# Patient Record
Sex: Female | Born: 1950 | Race: White | Hispanic: No | Smoking: Former smoker
Health system: Southern US, Community
[De-identification: ages and names within clinical notes are randomized; demographics above are authoritative.]

## PROBLEM LIST (undated history)

## (undated) DIAGNOSIS — F418 Other specified anxiety disorders: Secondary | ICD-10-CM

## (undated) DIAGNOSIS — I10 Essential (primary) hypertension: Secondary | ICD-10-CM

## (undated) DIAGNOSIS — E7800 Pure hypercholesterolemia, unspecified: Secondary | ICD-10-CM

## (undated) DIAGNOSIS — F99 Mental disorder, not otherwise specified: Secondary | ICD-10-CM

## (undated) DIAGNOSIS — J449 Chronic obstructive pulmonary disease, unspecified: Secondary | ICD-10-CM

## (undated) DIAGNOSIS — M1990 Unspecified osteoarthritis, unspecified site: Secondary | ICD-10-CM

## (undated) HISTORY — DX: Mental disorder, not otherwise specified: F99

## (undated) HISTORY — PX: KNEE SURGERY: SHX244

## (undated) HISTORY — PX: WRIST FRACTURE SURGERY: SHX121

## (undated) HISTORY — PX: CHOLECYSTECTOMY: SHX55

---

## 2000-08-15 ENCOUNTER — Other Ambulatory Visit: Admission: RE | Admit: 2000-08-15 | Discharge: 2000-08-15 | Payer: Self-pay | Admitting: Family Medicine

## 2001-02-11 ENCOUNTER — Other Ambulatory Visit: Admission: RE | Admit: 2001-02-11 | Discharge: 2001-02-11 | Payer: Self-pay | Admitting: Family Medicine

## 2001-02-13 ENCOUNTER — Encounter: Payer: Self-pay | Admitting: Family Medicine

## 2001-02-13 ENCOUNTER — Ambulatory Visit (HOSPITAL_COMMUNITY): Admission: RE | Admit: 2001-02-13 | Discharge: 2001-02-13 | Payer: Self-pay | Admitting: Family Medicine

## 2001-06-02 ENCOUNTER — Ambulatory Visit (HOSPITAL_COMMUNITY): Admission: RE | Admit: 2001-06-02 | Discharge: 2001-06-02 | Payer: Self-pay | Admitting: Orthopaedic Surgery

## 2001-06-02 ENCOUNTER — Encounter: Payer: Self-pay | Admitting: Orthopaedic Surgery

## 2001-07-27 ENCOUNTER — Encounter: Payer: Self-pay | Admitting: Family Medicine

## 2001-07-27 ENCOUNTER — Ambulatory Visit (HOSPITAL_COMMUNITY): Admission: RE | Admit: 2001-07-27 | Discharge: 2001-07-27 | Payer: Self-pay | Admitting: Family Medicine

## 2001-09-29 ENCOUNTER — Emergency Department (HOSPITAL_COMMUNITY): Admission: EM | Admit: 2001-09-29 | Discharge: 2001-09-29 | Payer: Self-pay | Admitting: *Deleted

## 2001-11-10 ENCOUNTER — Encounter: Payer: Self-pay | Admitting: Family Medicine

## 2001-11-10 ENCOUNTER — Ambulatory Visit (HOSPITAL_COMMUNITY): Admission: RE | Admit: 2001-11-10 | Discharge: 2001-11-10 | Payer: Self-pay | Admitting: Family Medicine

## 2002-02-05 ENCOUNTER — Encounter: Payer: Self-pay | Admitting: Orthopaedic Surgery

## 2002-02-05 ENCOUNTER — Ambulatory Visit (HOSPITAL_COMMUNITY): Admission: RE | Admit: 2002-02-05 | Discharge: 2002-02-05 | Payer: Self-pay | Admitting: Orthopaedic Surgery

## 2002-02-15 ENCOUNTER — Encounter: Payer: Self-pay | Admitting: Family Medicine

## 2002-02-15 ENCOUNTER — Ambulatory Visit (HOSPITAL_COMMUNITY): Admission: RE | Admit: 2002-02-15 | Discharge: 2002-02-15 | Payer: Self-pay | Admitting: Family Medicine

## 2002-04-22 ENCOUNTER — Encounter (HOSPITAL_COMMUNITY): Admission: RE | Admit: 2002-04-22 | Discharge: 2002-05-22 | Payer: Self-pay | Admitting: Orthopaedic Surgery

## 2002-05-31 ENCOUNTER — Ambulatory Visit (HOSPITAL_COMMUNITY): Admission: RE | Admit: 2002-05-31 | Discharge: 2002-05-31 | Payer: Self-pay | Admitting: Obstetrics & Gynecology

## 2002-05-31 ENCOUNTER — Encounter: Payer: Self-pay | Admitting: Obstetrics & Gynecology

## 2002-06-14 ENCOUNTER — Observation Stay (HOSPITAL_COMMUNITY): Admission: RE | Admit: 2002-06-14 | Discharge: 2002-06-15 | Payer: Self-pay | Admitting: General Surgery

## 2002-07-31 ENCOUNTER — Emergency Department (HOSPITAL_COMMUNITY): Admission: EM | Admit: 2002-07-31 | Discharge: 2002-07-31 | Payer: Self-pay | Admitting: *Deleted

## 2002-08-05 ENCOUNTER — Emergency Department (HOSPITAL_COMMUNITY): Admission: EM | Admit: 2002-08-05 | Discharge: 2002-08-05 | Payer: Self-pay | Admitting: Emergency Medicine

## 2002-11-05 ENCOUNTER — Encounter: Payer: Self-pay | Admitting: Orthopedic Surgery

## 2002-11-10 ENCOUNTER — Ambulatory Visit (HOSPITAL_COMMUNITY): Admission: RE | Admit: 2002-11-10 | Discharge: 2002-11-10 | Payer: Self-pay | Admitting: Orthopedic Surgery

## 2002-11-10 ENCOUNTER — Encounter: Payer: Self-pay | Admitting: Orthopedic Surgery

## 2003-02-18 ENCOUNTER — Ambulatory Visit (HOSPITAL_COMMUNITY): Admission: RE | Admit: 2003-02-18 | Discharge: 2003-02-18 | Payer: Self-pay | Admitting: Family Medicine

## 2003-11-08 ENCOUNTER — Ambulatory Visit (HOSPITAL_COMMUNITY): Admission: RE | Admit: 2003-11-08 | Discharge: 2003-11-08 | Payer: Self-pay | Admitting: General Surgery

## 2004-02-03 ENCOUNTER — Ambulatory Visit: Payer: Self-pay | Admitting: Family Medicine

## 2004-02-22 ENCOUNTER — Ambulatory Visit (HOSPITAL_COMMUNITY): Admission: RE | Admit: 2004-02-22 | Discharge: 2004-02-22 | Payer: Self-pay | Admitting: Family Medicine

## 2004-03-31 ENCOUNTER — Emergency Department (HOSPITAL_COMMUNITY): Admission: EM | Admit: 2004-03-31 | Discharge: 2004-03-31 | Payer: Self-pay | Admitting: Emergency Medicine

## 2004-04-05 ENCOUNTER — Ambulatory Visit: Payer: Self-pay | Admitting: Orthopedic Surgery

## 2004-04-06 ENCOUNTER — Ambulatory Visit: Payer: Self-pay | Admitting: Family Medicine

## 2004-05-03 ENCOUNTER — Ambulatory Visit: Payer: Self-pay | Admitting: Orthopedic Surgery

## 2004-07-12 ENCOUNTER — Ambulatory Visit: Payer: Self-pay | Admitting: Family Medicine

## 2004-10-15 ENCOUNTER — Ambulatory Visit: Payer: Self-pay | Admitting: Family Medicine

## 2004-12-03 ENCOUNTER — Ambulatory Visit: Payer: Self-pay | Admitting: Family Medicine

## 2005-01-03 ENCOUNTER — Ambulatory Visit: Payer: Self-pay | Admitting: Family Medicine

## 2005-02-12 ENCOUNTER — Ambulatory Visit: Payer: Self-pay | Admitting: Family Medicine

## 2005-02-25 ENCOUNTER — Ambulatory Visit (HOSPITAL_COMMUNITY): Admission: RE | Admit: 2005-02-25 | Discharge: 2005-02-25 | Payer: Self-pay | Admitting: Family Medicine

## 2005-05-29 ENCOUNTER — Ambulatory Visit: Payer: Self-pay | Admitting: Family Medicine

## 2005-10-17 ENCOUNTER — Ambulatory Visit: Payer: Self-pay | Admitting: Gastroenterology

## 2005-10-24 ENCOUNTER — Encounter (INDEPENDENT_AMBULATORY_CARE_PROVIDER_SITE_OTHER): Payer: Self-pay | Admitting: Specialist

## 2005-10-24 ENCOUNTER — Ambulatory Visit (HOSPITAL_COMMUNITY): Admission: RE | Admit: 2005-10-24 | Discharge: 2005-10-24 | Payer: Self-pay | Admitting: Gastroenterology

## 2005-10-24 ENCOUNTER — Ambulatory Visit: Payer: Self-pay | Admitting: Gastroenterology

## 2005-12-26 ENCOUNTER — Ambulatory Visit: Payer: Self-pay | Admitting: Gastroenterology

## 2006-02-05 ENCOUNTER — Ambulatory Visit (HOSPITAL_COMMUNITY): Admission: RE | Admit: 2006-02-05 | Discharge: 2006-02-05 | Payer: Self-pay | Admitting: Family Medicine

## 2006-02-15 ENCOUNTER — Emergency Department (HOSPITAL_COMMUNITY): Admission: EM | Admit: 2006-02-15 | Discharge: 2006-02-15 | Payer: Self-pay | Admitting: Emergency Medicine

## 2006-02-27 ENCOUNTER — Ambulatory Visit (HOSPITAL_COMMUNITY): Admission: RE | Admit: 2006-02-27 | Discharge: 2006-02-27 | Payer: Self-pay | Admitting: Family Medicine

## 2006-03-26 ENCOUNTER — Emergency Department (HOSPITAL_COMMUNITY): Admission: EM | Admit: 2006-03-26 | Discharge: 2006-03-26 | Payer: Self-pay | Admitting: Emergency Medicine

## 2006-04-01 ENCOUNTER — Ambulatory Visit: Payer: Self-pay | Admitting: Orthopedic Surgery

## 2006-04-02 ENCOUNTER — Ambulatory Visit (HOSPITAL_COMMUNITY): Admission: RE | Admit: 2006-04-02 | Discharge: 2006-04-02 | Payer: Self-pay | Admitting: Family Medicine

## 2006-05-29 ENCOUNTER — Ambulatory Visit: Payer: Self-pay | Admitting: Orthopedic Surgery

## 2006-07-01 ENCOUNTER — Ambulatory Visit: Payer: Self-pay | Admitting: Gastroenterology

## 2006-08-12 ENCOUNTER — Ambulatory Visit: Payer: Self-pay | Admitting: Gastroenterology

## 2006-09-12 ENCOUNTER — Emergency Department (HOSPITAL_COMMUNITY): Admission: EM | Admit: 2006-09-12 | Discharge: 2006-09-12 | Payer: Self-pay | Admitting: Emergency Medicine

## 2006-09-22 ENCOUNTER — Emergency Department (HOSPITAL_COMMUNITY): Admission: EM | Admit: 2006-09-22 | Discharge: 2006-09-22 | Payer: Self-pay | Admitting: Emergency Medicine

## 2006-09-29 ENCOUNTER — Ambulatory Visit: Payer: Self-pay | Admitting: Orthopedic Surgery

## 2006-10-28 ENCOUNTER — Emergency Department (HOSPITAL_COMMUNITY): Admission: EM | Admit: 2006-10-28 | Discharge: 2006-10-28 | Payer: Self-pay | Admitting: *Deleted

## 2006-11-10 ENCOUNTER — Ambulatory Visit: Payer: Self-pay | Admitting: Orthopedic Surgery

## 2007-02-27 ENCOUNTER — Ambulatory Visit: Payer: Self-pay | Admitting: Gastroenterology

## 2007-03-26 ENCOUNTER — Encounter: Payer: Self-pay | Admitting: Family Medicine

## 2007-05-03 ENCOUNTER — Emergency Department (HOSPITAL_COMMUNITY): Admission: EM | Admit: 2007-05-03 | Discharge: 2007-05-03 | Payer: Self-pay | Admitting: Emergency Medicine

## 2007-09-08 ENCOUNTER — Inpatient Hospital Stay (HOSPITAL_COMMUNITY): Admission: EM | Admit: 2007-09-08 | Discharge: 2007-09-10 | Payer: Self-pay | Admitting: Emergency Medicine

## 2007-09-09 ENCOUNTER — Ambulatory Visit: Payer: Self-pay | Admitting: Gastroenterology

## 2007-09-10 ENCOUNTER — Ambulatory Visit: Payer: Self-pay | Admitting: Internal Medicine

## 2007-09-23 ENCOUNTER — Ambulatory Visit (HOSPITAL_COMMUNITY): Admission: RE | Admit: 2007-09-23 | Discharge: 2007-09-23 | Payer: Self-pay | Admitting: Internal Medicine

## 2007-10-14 ENCOUNTER — Emergency Department (HOSPITAL_COMMUNITY): Admission: EM | Admit: 2007-10-14 | Discharge: 2007-10-14 | Payer: Self-pay | Admitting: Emergency Medicine

## 2007-10-15 ENCOUNTER — Inpatient Hospital Stay (HOSPITAL_COMMUNITY): Admission: EM | Admit: 2007-10-15 | Discharge: 2007-10-16 | Payer: Self-pay | Admitting: Emergency Medicine

## 2007-11-20 ENCOUNTER — Encounter
Admission: RE | Admit: 2007-11-20 | Discharge: 2007-11-23 | Payer: Self-pay | Admitting: Physical Medicine & Rehabilitation

## 2007-11-23 ENCOUNTER — Ambulatory Visit: Payer: Self-pay | Admitting: Physical Medicine & Rehabilitation

## 2007-12-09 DIAGNOSIS — M4120 Other idiopathic scoliosis, site unspecified: Secondary | ICD-10-CM | POA: Insufficient documentation

## 2007-12-09 DIAGNOSIS — R141 Gas pain: Secondary | ICD-10-CM

## 2007-12-09 DIAGNOSIS — K589 Irritable bowel syndrome without diarrhea: Secondary | ICD-10-CM

## 2007-12-09 DIAGNOSIS — J45909 Unspecified asthma, uncomplicated: Secondary | ICD-10-CM

## 2007-12-09 DIAGNOSIS — I10 Essential (primary) hypertension: Secondary | ICD-10-CM | POA: Insufficient documentation

## 2007-12-09 DIAGNOSIS — E785 Hyperlipidemia, unspecified: Secondary | ICD-10-CM

## 2007-12-09 DIAGNOSIS — R109 Unspecified abdominal pain: Secondary | ICD-10-CM | POA: Insufficient documentation

## 2007-12-09 DIAGNOSIS — R143 Flatulence: Secondary | ICD-10-CM

## 2007-12-09 DIAGNOSIS — E119 Type 2 diabetes mellitus without complications: Secondary | ICD-10-CM

## 2007-12-09 DIAGNOSIS — Z9889 Other specified postprocedural states: Secondary | ICD-10-CM | POA: Insufficient documentation

## 2007-12-09 DIAGNOSIS — K219 Gastro-esophageal reflux disease without esophagitis: Secondary | ICD-10-CM

## 2007-12-09 DIAGNOSIS — R142 Eructation: Secondary | ICD-10-CM

## 2008-06-14 ENCOUNTER — Emergency Department (HOSPITAL_COMMUNITY): Admission: EM | Admit: 2008-06-14 | Discharge: 2008-06-14 | Payer: Self-pay | Admitting: Emergency Medicine

## 2008-09-27 ENCOUNTER — Ambulatory Visit (HOSPITAL_COMMUNITY): Admission: RE | Admit: 2008-09-27 | Discharge: 2008-09-27 | Payer: Self-pay | Admitting: Internal Medicine

## 2008-10-09 ENCOUNTER — Encounter: Payer: Self-pay | Admitting: Orthopedic Surgery

## 2008-10-09 ENCOUNTER — Emergency Department (HOSPITAL_COMMUNITY): Admission: EM | Admit: 2008-10-09 | Discharge: 2008-10-09 | Payer: Self-pay | Admitting: Emergency Medicine

## 2008-11-01 ENCOUNTER — Ambulatory Visit: Payer: Self-pay | Admitting: Orthopedic Surgery

## 2008-11-01 DIAGNOSIS — Z8679 Personal history of other diseases of the circulatory system: Secondary | ICD-10-CM | POA: Insufficient documentation

## 2008-11-01 DIAGNOSIS — S93609A Unspecified sprain of unspecified foot, initial encounter: Secondary | ICD-10-CM | POA: Insufficient documentation

## 2008-11-01 DIAGNOSIS — S93409A Sprain of unspecified ligament of unspecified ankle, initial encounter: Secondary | ICD-10-CM | POA: Insufficient documentation

## 2008-12-01 ENCOUNTER — Emergency Department (HOSPITAL_COMMUNITY): Admission: EM | Admit: 2008-12-01 | Discharge: 2008-12-01 | Payer: Self-pay | Admitting: Emergency Medicine

## 2009-11-22 ENCOUNTER — Emergency Department (HOSPITAL_COMMUNITY): Admission: EM | Admit: 2009-11-22 | Discharge: 2009-11-22 | Payer: Self-pay | Admitting: Emergency Medicine

## 2009-12-09 ENCOUNTER — Emergency Department (HOSPITAL_COMMUNITY): Admission: EM | Admit: 2009-12-09 | Discharge: 2009-12-09 | Payer: Self-pay | Admitting: Emergency Medicine

## 2010-01-22 ENCOUNTER — Emergency Department (HOSPITAL_COMMUNITY): Admission: EM | Admit: 2010-01-22 | Discharge: 2010-01-22 | Payer: Self-pay | Admitting: Emergency Medicine

## 2010-04-15 ENCOUNTER — Encounter: Payer: Self-pay | Admitting: Internal Medicine

## 2010-04-24 NOTE — Letter (Signed)
Summary: office notes  office notes   Imported By: Dierdre Harness 11/10/2009 08:26:15  _____________________________________________________________________  External Attachment:    Type:   Image     Comment:   External Document

## 2010-04-24 NOTE — Letter (Signed)
Summary: misc  misc   Imported By: Dierdre Harness 11/10/2009 08:25:38  _____________________________________________________________________  External Attachment:    Type:   Image     Comment:   External Document

## 2010-04-24 NOTE — Letter (Signed)
Summary: demo  demo   Imported By: Dierdre Harness 11/10/2009 08:24:03  _____________________________________________________________________  External Attachment:    Type:   Image     Comment:   External Document

## 2010-04-24 NOTE — Letter (Signed)
Summary: CONSULTS  CONSULTS   Imported By: Dierdre Harness 11/09/2009 15:29:28  _____________________________________________________________________  External Attachment:    Type:   Image     Comment:   External Document

## 2010-04-24 NOTE — Letter (Signed)
Summary: labs  labs   Imported By: Dierdre Harness 11/10/2009 08:25:03  _____________________________________________________________________  External Attachment:    Type:   Image     Comment:   External Document

## 2010-04-24 NOTE — Letter (Signed)
Summary: history and physical  history and physical   Imported By: Dierdre Harness 11/10/2009 08:24:36  _____________________________________________________________________  External Attachment:    Type:   Image     Comment:   External Document

## 2010-05-27 ENCOUNTER — Emergency Department (HOSPITAL_COMMUNITY): Payer: Medicare Other

## 2010-05-27 ENCOUNTER — Emergency Department (HOSPITAL_COMMUNITY)
Admission: EM | Admit: 2010-05-27 | Discharge: 2010-05-27 | Disposition: A | Discharge status: Home / Self Care | Payer: Medicare Other | Attending: Emergency Medicine | Admitting: Emergency Medicine

## 2010-05-27 DIAGNOSIS — S0120XA Unspecified open wound of nose, initial encounter: Secondary | ICD-10-CM | POA: Insufficient documentation

## 2010-05-27 DIAGNOSIS — S01409A Unspecified open wound of unspecified cheek and temporomandibular area, initial encounter: Secondary | ICD-10-CM | POA: Insufficient documentation

## 2010-05-27 DIAGNOSIS — Y93E5 Activity, floor mopping and cleaning: Secondary | ICD-10-CM | POA: Insufficient documentation

## 2010-05-27 DIAGNOSIS — Y92009 Unspecified place in unspecified non-institutional (private) residence as the place of occurrence of the external cause: Secondary | ICD-10-CM | POA: Insufficient documentation

## 2010-05-27 DIAGNOSIS — I10 Essential (primary) hypertension: Secondary | ICD-10-CM | POA: Insufficient documentation

## 2010-05-27 DIAGNOSIS — M549 Dorsalgia, unspecified: Secondary | ICD-10-CM | POA: Insufficient documentation

## 2010-05-27 DIAGNOSIS — W010XXA Fall on same level from slipping, tripping and stumbling without subsequent striking against object, initial encounter: Secondary | ICD-10-CM | POA: Insufficient documentation

## 2010-05-27 DIAGNOSIS — E119 Type 2 diabetes mellitus without complications: Secondary | ICD-10-CM | POA: Insufficient documentation

## 2010-05-27 DIAGNOSIS — G8929 Other chronic pain: Secondary | ICD-10-CM | POA: Insufficient documentation

## 2010-06-07 LAB — URINALYSIS, ROUTINE W REFLEX MICROSCOPIC
Bilirubin Urine: NEGATIVE
Glucose, UA: 500 mg/dL — AB
Ketones, ur: NEGATIVE mg/dL
Specific Gravity, Urine: <1.005 — ABNORMAL LOW (ref 1.005–1.030)
pH: 6 (ref 5.0–8.0)

## 2010-06-07 LAB — DIFFERENTIAL
Eosinophils Relative: 1 % (ref 0–5)
Lymphocytes Relative: 11 % — ABNORMAL LOW (ref 12–46)
Lymphs Abs: 0.8 10*3/uL (ref 0.7–4.0)
Monocytes Absolute: 0.4 10*3/uL (ref 0.1–1.0)
Neutro Abs: 6.6 10*3/uL (ref 1.7–7.7)

## 2010-06-07 LAB — CBC
HCT: 29 % — ABNORMAL LOW (ref 36.0–46.0)
MCV: 90.9 fL (ref 78.0–100.0)
RBC: 3.19 MIL/uL — ABNORMAL LOW (ref 3.87–5.11)
WBC: 7.9 10*3/uL (ref 4.0–10.5)

## 2010-06-07 LAB — BASIC METABOLIC PANEL
Chloride: 109 mEq/L (ref 96–112)
GFR calc Af Amer: >60 mL/min (ref >60)
Potassium: 4.3 mEq/L (ref 3.5–5.1)
Sodium: 137 mEq/L (ref 135–145)

## 2010-06-07 LAB — GLUCOSE, CAPILLARY
Glucose-Capillary: 166 mg/dL — ABNORMAL HIGH (ref 70–99)
Glucose-Capillary: 199 mg/dL — ABNORMAL HIGH (ref 70–99)
Glucose-Capillary: 61 mg/dL — ABNORMAL LOW (ref 70–99)

## 2010-08-07 NOTE — Discharge Summary (Signed)
Kristen Jones, DAKER              ACCOUNT NO.:  [id number]   MEDICAL RECORD NO.:  [id number]          PATIENT TYPE:  INP   LOCATION:  A330                          FACILITY:  [facility]   PHYSICIAN:  Thayer Headings, MD    DATE OF BIRTH:  1950-10-09   DATE OF ADMISSION:  09/08/2007  DATE OF DISCHARGE:  LH                               DISCHARGE SUMMARY   PRIMARY CARE PHYSICIAN:  Nurse, mental health in [place].   GASTROENTEROLOGIST:  Caro Hight, MD   HISTORY OF PRESENT ILLNESS:  Please see previously dictated history and  physical.  Briefly, this is a 60 year old female with a history of  chronic abdominal pain, bloating, diarrhea, and GERD, diagnosis of  functional abdominal disorder who presented with a complaint of  continued nausea, vomiting, and diarrhea, which she reports was her  baseline and also a worsening mid epigastric pain.  The patient reports  that she had had no blood in her emesis or stool and also to the  gastroenterologist reported an ear ache and some URI symptoms lately.  Upon presentation, it was noted that her AST and ALT were elevated, her  WBC was low, and her platelets were low.   PROBLEMS AT DISCHARGE:  1. Transaminitis.  2. Leukopenia.  3. Thrombocytopenia.  4. Diabetes mellitus.  5. Hypertension.  6. Gastroesophageal reflux disease.  7. Functional gut disorder.  8. Hyperlipidemia.  9. Asthma.  10.History of carpal tunnel release.  11.History of cholecystectomy.  12.History of knee replacement.   Medications at discharge: The patient is unaware of her medications and  did not bring her medications with her and therefore have not been  verified.   HOSPITAL COURSE BY PROBLEMS:  1. Transaminitis.  The patient's initial AST was 141 with an ALT of      80, which was subsequently decreased to 112 and 70 respectively      with the followup labs at discharge, pending.  The patient reports      that her symptoms, which included some midepigastric pain or  resolved rather quickly by the a.m. of after her day of admission.      At discharge, the patient was feeling well and was eager to go home      and had no and her LFTs were trending down.  Etiology of this is      unknown at this time though this certainly a viral syndrome,such as      EBV or CMV or possibilities, particularly with leukopenia as well.      She will need further follow up with her primary care physician to      assure that this has resolved.  Also, a hepatitis panel was pending      at discharge.  2. Acute renal failure.  The patient's creatinine was noted to be      elevated at 1.8 on admission, which is up from a baseline of 1.3.      At discharge, responded to fluids and was down to 1.44, which is      near her baseline.  3. Leukopenia with thrombocytopenia.  Her initial WBC was 2.2, and      noted to have mildly elevated eosinophils at 7% and also her      platelet count was 63.  Etiology of this is unknown as well      certainly differential includes medications, also viral syndrome.      It is a bit difficult to know what medications may be the culprit.      She is unaware of what medications she takes and has not brought      them into to be evaluated.  Her absolute neutrophil count at      discharge was 900, and the patient did remain afebrile, so no      concern for febrile neutropenia at this time.  However, she will      need a close followup with her primary care physician as well to      assure that it is resolving or she will need a referral to      Hematology.  4. Hypotension.  The patient responded to fluids and her blood      pressure prior to discharge was in the normal range of 110/62 and      again the patient has had no significant complaints.  5. Smoking.  The patient continues to smoke.  She was counseled on      tobacco cessation.  She did also require inhalers due to her likely      underlying COPD.  6. Abdominal pain.  The patient did have an  ultrasound done during the      hospitalization, which did show probable fatty liver.  The patient      was counseled on diet modifications and will need to continue with      low-fat diet.      Thayer Headings, MD  Electronically Signed     RWC/MEDQ  D:  09/10/2007  T:  09/10/2007  Job:  [id number]

## 2010-08-07 NOTE — Group Therapy Note (Signed)
Kristen Jones, Kristen Jones              ACCOUNT NO.:  [id number]   MEDICAL RECORD NO.:  [id number]          PATIENT TYPE:  INP   LOCATION:  A330                          FACILITY:  [facility]   PHYSICIAN:  Thayer Headings, MD    DATE OF BIRTH:  April 06, 1950   DATE OF PROCEDURE:  09/09/2007  DATE OF DISCHARGE:                                 PROGRESS NOTE   SUBJECTIVE:  Overnight, no acute events, and in fact this a.m. the  patient reports she is having no more diarrhea, abdominal pain, and has  not had any emesis.   PHYSICAL EXAMINATION:  VITAL SIGNS:  Temperature 98.8, pulse is 68,  respirations 20, blood pressure 78/47, O2 saturations 95% on room air.  GENERAL:  The patient is awake, alert, and oriented x3, and appears in  no acute distress.  CARDIOVASCULAR:  Regular rate and rhythm, with no murmurs, rubs, or  gallops.  LUNGS:  Clear to auscultation bilaterally.  ABDOMEN:  Soft, nontender, nondistended.  Positive bowel sounds, and no  hepatosplenomegaly.  EXTREMITIES:  With no edema.   LABORATORY DATA:  This a.m., WBC is 2.5, hemoglobin 10.5, platelets 56,  and other labs pending.   ASSESSMENT AND PLAN:  1. Abdominal pain.  This appears to be resolved for the patient, and      the patient symptomatically is feeling much improved.  Will await      input from Dr. Stann Mainland regarding her chronic abdominal pain and      diarrhea.  Will also ensure that she is taking p.o. today, and      continue with her medications.  2. Leukopenia with thrombocytopenia.  Will have hematology evaluate      today.  3. Transaminitis.  As #1, we will have gastroenterology input, and      awaiting hepatitis panel.      Thayer Headings, MD  Electronically Signed     RWC/MEDQ  D:  09/09/2007  T:  09/09/2007  Job:  [id number]

## 2010-08-07 NOTE — Assessment & Plan Note (Signed)
NAMEMarland Kitchen  Kristen Jones, Kristen Jones               CHART#:  [id number]   DATE:  02/27/2007                       DOB:  [birth year]   REFERRING PHYSICIAN:  Dr. Lorelee Market.   PROBLEM LIST:  1. Abdominal pain and bloating secondary to functional gut disorder.  2. Hypertension.  3. Gastroesophageal reflux disease.  4. Hyperlipidemia.   SUBJECTIVE:  The patient is a 60 year old female who presents for return  patient visit.  She states her abdominal pain is better.  She does not  need any refills.  She is not having any nausea or vomiting.  Her last  colonoscopy was in 2006.   OBJECTIVE:  VITAL SIGNS:  Weight 144.5 pounds (down 2 and 1/2 pounds  since July 2007), height 4 feet 10 inches, temperature 98, blood  pressure 120/32, pulse 72.  GENERAL:  She is in no apparent distress, alert and oriented x4.  LUNGS:  Clear to auscultation bilaterally.  CARDIOVASCULAR:  Regular rhythm.  No murmur.  Normal S1 and S2.  ABDOMEN:  Bowel sounds are present.  Soft, nontender, nondistended.  No  rebound or guarding.   ASSESSMENT:  The patient is a 60 year old female with functional gut  disorder whose symptoms seem to be fairly well controlled on  hyoscyamine.   Thank you for allowing me to see the patient in consultation.  My  recommendations follow.   RECOMMENDATIONS:  1. Continue hyoscyamine and omeprazole.  2. Return patient visit in 12 months.  3. I will be happy to refill her hyoscyamine and omeprazole in the      meantime.       Caro Hight, M.D.  Electronically Signed     SM/MEDQ  D:  02/27/2007  T:  02/27/2007  Job:  [id number]   cc:   Lorelee Market

## 2010-08-07 NOTE — Consult Note (Signed)
Kristen Jones, Kristen Jones              ACCOUNT NO.:  [id number]   MEDICAL RECORD NO.:  [id number]          PATIENT TYPE:  INP   LOCATION:  A330                          FACILITY:  [facility]   PHYSICIAN:  Caro Hight, M.D.      DATE OF BIRTH:  [birth year]   DATE OF CONSULTATION:  09/09/2007  DATE OF DISCHARGE:                                 CONSULTATION   GASTROENTEROLOGY CONSULTATION   REASON FOR CONSULTATION:  Nausea, vomiting, diarrhea, transaminitis.   HISTORY OF PRESENT ILLNESS:  The patient is a 60 year old Caucasian  female who is a very poor historian who presented to the emergency  department yesterday evening with complaints of several day history of  nausea, vomiting, diarrhea.  She states her symptoms began with an  earache several days ago.  She called her primary care physician, Dr.  Sherrie Sport.  She states she was given possibly an antibiotic.  She says she  was not feeling any better and she eventually developed more of a chest  congestion and cold.  Four days ago she started having vomiting as well  as diarrhea.  She has not been able to eat or keep anything down.  She  presented to the emergency department.  There, she was found to have a  creatinine of 1.8, her BUN was 31.  Her white count was 2.2.  Hemoglobin  was 12.3.  Platelet count was 63,000.  Her transaminases were elevated  with an AST of 141, ALT of 80, albumin was 3.  She was admitted for  further work up and management.  This morning she states she is feeling  better.  She has had no vomiting or diarrhea since admission.  She has  not had any supportive measures other than fluids since she has been  admitted to the floor.  This morning she was eating eggs, bacon and  biscuit without any difficulties.   She denies any hematemesis, melena, bright red blood per rectum.  She  denies any abdominal pain.  She complains of subjective chills. She has  a history of chronic GERD, well controlled on omeprazole.  She also  has  a history of functional gut disorder.  She was last seen in December of  2008 by Dr. Stann Mainland and was doing well at that time on Levbid.  Unfortunately, the patient does not know which medication she is on.  She did not bring any list of her pills.  She is not sure what medicines  she is allergic to.   MEDICATIONS:  Medications at home, suspect to be incomplete:  She states  she is on potassium chloride, metanx, omeprazole, glyburide, paroxetine,  Benicar, buspirone, Lasix, Asmanex, albuterol, and recent antibiotic.   ALLERGIES:  Listed in the medical records as [place], [place] and [place].   PAST MEDICAL HISTORY:  1. Diabetes mellitus.  2. Hypertension.  3. Chronic GERD.  4. Functional gut disorder.  5. Hyperlipidemia.  6. Asthma.  7. Carpal tunnel release.  8. Cholecystectomy.  9. She states she has had a knee replacement and two knee surgeries on      the  left.  Last surgery over 3 years ago.  10.EGD in our office in 2007.  She had a 3 mm duodenal polyp.      Pathology was benign. Duodenal biopsy showed chronic inflammation      but no villous atrophy.  She says she had a colonoscopy in 2006 and      denies having any polyps.   FAMILY HISTORY:  Negative for colorectal cancer or polyps.   SOCIAL HISTORY:  She is single. She has one child.  She is disabled.  She smokes way too much and no alcohol use or drug use.   REVIEW OF SYSTEMS:  See history of present illness for GI.  She HPI for  cardiopulmonary.  GENITOURINARY:  Denies any dysuria, hematuria.   PHYSICAL EXAMINATION:  VITAL SIGNS:  T current 98.8.  T max 99. Pulse  68.  Respirations 20.  Blood pressure 78/47.  Height 58 inches. Weight  63.8 kg.  GENERAL:  Pleasant, middle-aged, obese Caucasian female in no acute  distress. SKIN:  Warm and dry, no  jaundice.HEENT:  Sclerae nonicteric.  Oropharyngeal mucosa moist and pink.  No lymphadenopathy.CHEST:  Lungs  are clear to auscultation.CARDIAC:  Exam reveals regular  rate and  rhythm, normal S1, S2, no murmurs, rubs or gallops.  ABDOMEN:  Soft,  positive bowel sounds.  Abdomen nontender, nondistended, no organomegaly  or masses.  No rebound, no guarding, no abdominal bruits or  hernias.EXTREMITIES:  Lower extremities have no edema.   LABORATORY DATA:  White blood cell count 2500 with an ANC of 900, with  lymphocytes 51%, eosinophil count 8%, hemoglobin 10.5, hematocrit 29.3,  MCV 87.6, platelet count 56,000.  Sodium 132, potassium 4.2.  BUN 24,  creatinine 1.49, glucose 196, total bilirubin 0.5, alkaline phosphatase  32, AST 112, ALT 70, albumin 2.3.  Amylase and lipase normal.  INR 0.9.  Acute abdominal series revealed cardiomegaly with low lung volumes and  left base atelectasis or bronchiectasis, stable from previous exam.  Abdominal ultrasound pending.   IMPRESSION:  The patient is a 60 year old lady with history of chronic  functional gut disorder who was at baseline doing fairly well and  presented with a four day history of nausea, vomiting, diarrhea.  She is  found to have dehydration.  She also has upper respiratory symptoms.  She has significant leukopenia with neutropenia and overall really  pancytopenia.  Her transaminases are up, a year ago only minimally  elevated.  She did have fatty liver on a CT scan a year ago.  Her  eosinophil count is up as well.  Overall, at this point, would favor  acute viral illness.  The patient clinically is feeling better.  Hopefully, will be able to go home in the next 24 hours or so, however,  would like to see trend of her liver function tests as well as her  platelet count and white blood cell count, etc.  She will need to have  close gastrointestinal followup to make sure there is no evidence of  chronic liver disease.   PLAN:  1. Supportive measures.  2. Continue proton pump inhibitor.  3. Liver function tests and CBC in the morning.  4. Will follow her abdominal ultrasound results when  available.   We would like to thank Dr. Linus Salmons with [facility] for allowing [place]  to take part in the care of this patient.      Neil Crouch, P.A.      Caro Hight, M.D.  Electronically Signed    LL/MEDQ  D:  09/09/2007  T:  09/09/2007  Job:  [id number]   cc:   Thayer Headings, MD

## 2010-08-07 NOTE — H&P (Signed)
Kristen Jones, Kristen Jones              ACCOUNT NO.:  [id number]   MEDICAL RECORD NO.:  [id number]          PATIENT TYPE:  INP   LOCATION:  A330                          FACILITY:  [facility]   PHYSICIAN:  Thayer Headings, MD    DATE OF BIRTH:  [birth year]   DATE OF ADMISSION:  09/08/2007  DATE OF DISCHARGE:  LH                              HISTORY & PHYSICAL   PRIMARY CARE PHYSICIAN:  Lorelee Market, MD, in [place].   CHIEF COMPLAINT:  Nausea, vomiting and diarrhea.   HISTORY OF PRESENT ILLNESS:  This is a 60 year old female with a history  of chronic abdominal bloating, abdominal pain, diarrhea and GERD, status  post extensive GI workup in the past, who presents here with a complaint  of vomiting and diarrhea.  The patient does report that her diarrhea and  abdominal bloating have been the same as her chronic issues.  She does  report a new worsening midepigastric pain associated also with bloating  this a.m. and emesis x1 this a.m.  The patient denies any blood in her  emesis or stool and she does report she has been taking her usual  medications as prescribed; however, the patient is unaware of what her  medications are and does not have a list with her at this time.  The  patient does deny fever to me; however, she did report a fever to 102 to  the emergency room physician.  The patient also reports no sick contacts  and no travel.  Of note, the patient at this is a poor historian and  does not directly answer questions.  The patient does deny headache,  chest pain, neck pain, dysuria, hematuria or any other complaints.   PAST MEDICAL HISTORY:  1. Chronic abdominal pain with bloating secondary to functional gut      disorder, followed by Dr. Stann Mainland.  2. Hypertension.  3. GERD.  4. Hyperlipidemia.  5. Diabetes.  6. COPD.  7. Hyperlipidemia.  8. History of cholecystectomy.  9. Right carpal tunnel surgery.   Further medical history is unobtainable from the patient, though she is  on  medication for depression as well as a diuretic, which potentially is  for heart failure.   MEDICATIONS:  The patient is unsure what medications she is taking.  She  does get her medications from [place], phone number is [phone].  A list does include potassium chloride at 10 mEq, omeprazole,  glyburide, paroxetine, Benicar, buspirone, Asmanex [place],  albuterol, Lasix.  However, again, the patient does not know if these  are all active medications and what other medications she may be  missing.   ALLERGIES:  METOCLOPRAMIDE, ACIPHEX and FLUOTHANE, which all cause  stuttering speech.   SOCIAL HISTORY:  A long history of tobacco abuse but denies any alcohol  or drugs.   FAMILY HISTORY:  Noncontributory.   REVIEW OF SYSTEMS:  Negative except as per the history of present  illness.   PHYSICAL EXAM:  VITALS:  Temperature is 99.0, pulse is 77, respirations  20, blood pressure is 87/43, O2 saturation is 96%.  GENERAL:  The patient is awake and alert and oriented x3 and appears in  no acute distress.  She does not appear in any way ill-appearing.  HEENT: Anicteric with no thrush.  CARDIOVASCULAR:  Regular rate and rhythm with no murmurs, rubs or  gallops.  LUNGS:  Clear to auscultation bilaterally.  ABDOMEN:  Soft, nontender, nondistended, with positive bowel sounds and  no hepatosplenomegaly.  EXTREMITIES:  Without edema.   LABORATORY DATA:  A WBC of 2.2 with 56% neutrophils, hemoglobin of 12,  platelets 63.  Sodium 131, potassium 4.2, chloride 103, bicarb 23, BUN  31, creatinine is 1.80, which is up from her baseline, glucose is 113,  AST is 141, ALT is 80.  INR is 0.9.  Lipase is 28, amylase 57.  UA with  a few rbc's, negative nitrite and leukocyte.  T-bili 0.7, alkaline  phosphatase is 37, albumin is 3.0, total protein is 5.6.  Abdominal x-  ray shows no bowel obstruction or free air, with cardiomegaly and  possible bronchiectasis in the left base of the  lungs.   ASSESSMENT/PLAN:  This is a 60 year old female with a long history of  chronic diarrhea and abdominal pain with bloating, who presents here  with the same symptoms plus an episode of emesis and found to have  hypotension, leukopenia as well as thrombocytopenia and transaminitis,  all different from previous values, at least compared to 1 year ago.  At  this time the patient does not appear ill and although her white blood  cell count is low, there are no bands and her neutrophil level is normal  and does not appear to be a picture of bacteremia or sepsis or  infection, and the patient is afebrile in the hospital here.  There is a  question of whether not she had a fever at home.  She is unable to me to  elaborate if she had a fever and not, though she denies also having any  chills or other symptoms.  She does have what appears to be her normal  mental state, and therefore a bacteremia or sepsis syndrome would be  unlikely.  The leukopenia and thrombocytopenia may be more likely  secondary to an underlying process which may be medication-induced,  hepatitis or other causes of transaminitis, an underlying malignancy,  particularly in light of the patient's continued significant smoking  history, surreptitious alcohol abuse, though the patient denies ever  using alcohol, worsening of fatty liver disease.  Also with the  patient's transaminitis and low albumin, it does bring up a question of  whether or not there is an element of cirrhosis.  At this time I think  the best course of action is to check an ultrasound of her abdomen,  hydrate her as he does appear more hypovolemic compared to her usual  with an increased creatinine as well as her low blood pressure.  We will  check blood cultures with any fever and will monitor her closely for  fever.  Also, we will have input from gastroenterologist in the a.m.  We  will also check a chronic hepatitis panel.      Thayer Headings,  MD  Electronically Signed     RWC/MEDQ  D:  09/08/2007  T:  09/09/2007  Job:  [id number]

## 2010-08-07 NOTE — Discharge Summary (Signed)
[name], Kristen Jones              ACCOUNT NO.:  [id number]   MEDICAL RECORD NO.:  [id number]          PATIENT TYPE:  INP   LOCATION:  A329                          FACILITY:  [facility]   PHYSICIAN:  Anselmo Pickler, DO    DATE OF BIRTH:  18-Dec-1950   DATE OF ADMISSION:  10/14/2007  DATE OF DISCHARGE:  07/24/2009LH                               DISCHARGE SUMMARY   ADMISSION DIAGNOSES:  1. Nausea and vomiting, diarrhea.  2. Leukocytosis of unknown etiology.  3. History of thrombocytopenia.  4. History of diabetes.  5. History of dysfunctional gut disorder.  6. History of transaminitis and renal insufficiency.   DISCHARGE DIAGNOSES:  1. Nausea, vomiting, diarrhea resolved.  2. Leukocytosis resolved.  3. History of thrombocytopenia.  4. History of diabetes.  5. History dysfunctional gut disorder.  6. History of transaminitis and renal insufficiency, renal      insufficiency which is improved.   There were no consults made, and radiology tests that were done include  on July 22, one-view chest which demonstrated mild stable cardiac  enlargement; chronic bronchitic changes; no infiltrate, edema, or  effusions.  CT abdomen and pelvis without contrast demonstrated small  lung nodules; calcified left hilar nodes suggest prior granulitis  disease.  No acute intra-abdominal abnormalities.  And pelvis, no acute  CT abnormality of the pelvis; benign-appearing round mass in the right  pelvis with peripheral calcification.  Her H&P was done by Dr. Sherryle Lis.  Please refer.  The patient is a 60 year old Caucasian female who  presented with nausea, vomiting, diarrhea.  She was admitted to the  service of [facility].  There was some concern, however, that while she  was here at the time of admission, her white count was 22,000, and then  she was started on Levaquin for her leukocytosis in which it dropped  dramatically.  There was some concern as to her exposure to other sick  contacts, so she was  monitored, and she continued to improve.  We also  started her on IV hydration for renal insufficiency, and that continued  to improve and normalize.  Within 24 hours, it was determined the  patient was doing well.  She was improving, and she could be discharged  to home.   She will be sent home on the following medications which include:  1. Potassium chloride 10 mg p.o. daily.  2. Metanx, no dose given, 1 p.o. daily.  3. Omeprazole 20 mg 1 p.o. daily.  4. Glyburide, no dose given, 1 p.o. daily.  5. Paroxetine, no dose given, p.o. daily.  6. Benicar, no dose given, p.o. daily.  7. Buspirone, no dose given, p.o. daily.  8. Asthmanex twist inhaler 1 inhalation daily.  9. Albuterol, no dose given.  10.Latex, no dose given.   She can continue all those home medications.  She is recommended to  follow up with her primary care physician in 1 week if not sooner.  She  is to increase her activity slowly, eat a low-sodium, heart-healthy diet  that is bland at that this point in time.  If she has any other  concerns, she is recommended to follow up with her PCP.      Anselmo Pickler, DO  Electronically Signed     CB/MEDQ  D:  10/16/2007  T:  10/16/2007  Job:  [id number]

## 2010-08-07 NOTE — Assessment & Plan Note (Signed)
NAMEMarland Kitchen  Kristen Jones, Kristen Jones               CHART#:  [id number]   DATE:  08/12/2006                       DOB:  1951-02-18   REFERRING PHYSICIAN:  Lorelee Market, MD.   PROBLEM LIST:  1. Abdominal pain and bloating secondary to irritable bowel syndrome.  2. Hypertension.  3. Gastroesophageal reflux disease  4. Hyperlipidemia.   SUBJECTIVE:  Kristen Jones is a 60 year old female who presents as a  return patient visit. She states that her appetite is good and her  symptoms are not as bad. She has 1 to 2 bowel movements a day. Her  abdominal pain comes and goes. It depends on what she eats. Sometimes  her stomach swells, 1 to 2 times a week. Kristen Jones is something that makes  her symptoms worse. She knows what triggers her symptoms. She denies any  vomiting. She was supposed to be taking Levbid twice a day, but somehow  misunderstood her instructions on her last visit. She is continuing to  take the Levsin 4 times a day. Her nurse did not accompany her to her  actual visit on today, but did drive her to the visit.   MEDICATIONS:  It is unclear what medicines she is taking on today, but  she will call me when she gets home to confirm what her medication list  is.   OBJECTIVE:  PHYSICAL EXAM:  Weight:  147 pounds. (down 1 pound since  April of 2008) Height:  5 foot. BMI:  28.7 (overweight). Temperature:  98.2. Blood pressure:  116/60. Pulse:  56. GENERAL:  She is in no  apparent distress. Alert and oriented x4. HEENT:  Exam is atraumatic,  normocephalic. Pupils equal and reactive to light. Mouth:  No oral  lesions. Posterior pharynx without erythema or exudate. LUNGS:  Clear to  auscultation bilaterally. CARDIOVASCULAR:  Regular rhythm. No murmur.  Normal S1 and S2. ABDOMEN:  Bowel sounds present. Soft, nontender,  nondistended. No rebound or guarding.   ASSESSMENT:  Kristen Jones is a 60 year old female who has abdominal pain  and bloating secondary to irritable bowel syndrome. Her symptoms  are  fairly well controlled hyoscyamine  and appropriate dietary choices.  Thank you for allowing me to see Kristen Jones in consultation. My  recommendations follow.   RECOMMENDATIONS:  1. Recommend that when she gets home she call me with her medication      list. She should change from the Levsin to the [place] for better      medication adherence.  2. She should avoid foods that exacerbate her symptoms.  3. She may follow up to see me in 4 to 6 months.       Caro Hight, M.D.  Electronically Signed     SM/MEDQ  D:  08/12/2006  T:  08/13/2006  Job:  [id number]   cc:   Lorelee Market

## 2010-08-07 NOTE — Group Therapy Note (Signed)
Consult requested for the evaluation of low back pain.   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  Consult was requested for the evaluation of low back pain.   I have reviewed records which consists of ED visits mainly from [facility] as well as visits to Dr. Brunetta Genera at [facility] for primary care as well as for pain medications.  The patient  is accompanied by her chore worker who does help her with cooking,  cleaning, and driving her to doctor's visits.   The patient is hard of hearing but she has hearing aids and she is able  to communicate quite well with these in place.  In addition to her back  pain, she does have neck pain as well as arm numbness.  She has had  falls increasing over the last 6 months according to her aid.  She is  using a walker or a cane outside the home but rarely inside the home.  She has an 8/10 pain score but pain interference score is at the 5/10  level.  Her pain is sharp, burning, stabbing, tingling, and aching.  Her  pain is worse during the morning and evening hours.  Sleep is poor.  Pain is worse with activity but also with sitting.  Her walking  tolerance is 10 minutes.  She does not climb steps.   REVIEW OF SYSTEMS:  Positive for numbness and tingling in the feet,  trouble walking, dizziness, and confusion.  Denies any depression or  anxiety.  She also has wheezing, coughing, and shortness of breath.  She  is suppose to wear her sleep apnea mask, really does use O2 at night  from what the aid describes.  Despite having fairly significant COPD,  she continues to smoke 1 to 2 packs per day.   Her other past medical history is significant for total knee  replacement.  I do not have any accompanied records for this.  From her  description, it sounds like she has a total knee on the right and on the  left she has had open surgery  but also what sounds like an ACL  reconstruction.  Once again no records on this but she states that all  of her surgeries were done at [facility].   She has had no recent diagnostic imaging studies.  I did review what was  available to me which was the MRI of her cervical spine dated November 10, 2001.  This showed mild posterior broad-base disc bulge at C3-4  associated with posterior bony overgrowth consistent with disc  osteophyte complex.  There was ankle vertebral joint hypertrophy  bilaterally.  There was some mild narrowing at the neuroforamen at that  level.  Remaining levels were fairly unremarkable.  She has had MRI of  the lumbar spine dated June 02, 2001, and this really was unremarkable.   Her other past medical history is significant for what sound like  irritable bowel disorder.  She has diabetes and hypertension.   Her medications include Plavix, furosemide, fenofibrate, cetirizine,  omeprazole, potassium, Benicar, glyburide/metformin, and Metanx.  She is  no longer taking any narcotic analgesics, had been on up to q.i.d. of  the oxycodone 10/325.   She has sensitivity to [place], ACIPHEX, and what looks like  FLUOXETINE.   She has had reviewing her E-chart.  She  has had some ER visits for  abdominal pain and is fairly weak, and CT abdomen and pelvis was  unremarkable.  Her chest did show some calcified hilar nodules thought  to be consistent with granulomatous disease.   PHYSICAL EXAMINATION:  GENERAL:  Small-statured female in no acute  distress.  She appears older than her stated age.  She has poor  dentition.  Her orientation is x3.  She is quite, but otherwise mood and  affect are appropriate.  NECK:  Her neck has 75% range forward flexion, extension, lateral  rotation, and bending.  EXTREMITIES:  Motor strength is 5/5 bilateral deltoid, biceps, triceps,  grip as well as hip flexion, knee extension, and right ankle  dorsiflexion, and left ankle  dorsiflexion is 3.  Her sensation is intact  to pinprick bilateral upper and lower extremities.  She has no intrinsic  atrophy.  She has no evidence of fasciculations.  She has good range of  motion in deltoid, elbows, wrist, hands, hips, reduced in the knees in  terms of flexion and intact at the ankles.  Her toes are downgoing.  Her  Babinski's are downgoing.  Hoffmann sign is negative.  Her deep tendon  reflex is however 3+ bilateral biceps, triceps, brachioradialis, knee,  and ankle.  No evidence of clonus.  Gait is somewhat wide base.  She  does have a foot drop on the left side and on further questioning she  indicates that was after one of her knee surgeries she had its onset.  She has no signs of ataxia.   Back has 75% forward flexion and 50% extension.   IMPRESSION:  1. Back pain with some proximal leg radiating pain, does not appear to      be overtly radicular.  Her straight leg raise is negative.  She      does have a left foot drop but this may be more peripheral.  MRI      from about 6 years ago was unremarkable.  We will start her on      tramadol but avoid narcotic analgesic given her chronic obstructive      pulmonary disease plus her gait disorder and fall propensity.  2. Neck pain with cervical stenosis that was mild 6 years ago but this      may have progressed.  I would like for her to be evaluated further      for this given her increasing falls.  3. I will send her over to Dr. Harl Bowie of [facility], let them do the      MRI there.  4. We will start her on some physical therapy, gait training, balance      training.  5. I have offered a phone number.  6. I will check the EMG for left lower extremity.   Thank you for this interesting consultation.  I will keep you apprised  of her situation.      Charlett Blake, M.D.  Electronically Signed     AEK/MedQ  D:  11/23/2007 16:39:45  T:  11/24/2007 06:26:00  Job #:  [id number]   cc:   Dr. Royston Cowper  Fax: [phone]

## 2010-08-07 NOTE — H&P (Signed)
NAMEDNASIA, GRITZMACHER              ACCOUNT NO.:  [id number]   MEDICAL RECORD NO.:  [id number]          PATIENT TYPE:  INP   LOCATION:  IC06                          FACILITY:  [facility]   PHYSICIAN:  Salem Caster, DO    DATE OF BIRTH:  September 03, 1950   DATE OF ADMISSION:  10/14/2007  DATE OF DISCHARGE:  LH                              HISTORY & PHYSICAL   CHIEF COMPLAINT:  Nausea and vomiting, diarrhea.   HISTORY OF PRESENT ILLNESS:  This is a 60 year old Caucasian female who  presents with nausea, vomiting, and diarrhea.  According to the previous  chart, the patient has a history of chronic abdominal pain, bloating,  diarrhea, GERD, and she had a diagnosis of dysfunctional abdominal  disorder.  The patient has been seen by gastroenterology in the past  especially back in June of 2009 when she was discharged from [facility].  Her primary care physician is Lorelee Market,  M.D. in [place], [place].  The patient states that she has been having nausea,  vomiting, and diarrhea for the last 2-3 days.  She states that it was  sudden onset.  She had yellow emesis and describes it as moderate.  The  patient denies any chest pain, shortness of breath, or any urinary  complaints at this time.  Apparently the patient has been in the  emergency room for over 7 hours.  The patient's white count was in the  normal range earlier in the day and at this time her white count is  22,000.  Secondary to this, ER physician called [place] to admit the patient  for workup of possible infection.   PAST MEDICAL HISTORY:  1. Asthma.  2. Hypertension.  3. Diabetes.  4. GERD.  5. Chronic abdominal pain.  6. Dysfunctional abdominal disorder.  7. History of thrombocytopenia, leukopenia.  8. Hyperlipidemia.  9. Carpal tunnel disease.  10.Transaminitis.   PAST SURGICAL HISTORY:  1. Cholecystectomy.  2. Knee replacement.  3. Carpal tunnel release.   SOCIAL HISTORY:  No history of alcohol or drug  abuse, but she is a  smoker.   ALLERGIES:  METOCLOPRAMIDE, ACIPHEX, FLUOTHANE.   HOME MEDICATIONS:  1. Potassium chloride 10 mEq daily.  2. Mentax unknown dose daily.  3. Omeprazole 20 mg daily.  4. Glyburide unknown dose.  5. Paxil unknown dose.  6. Benicar unknown dose.  7. BuSpar unknown dose.  8. Asmanex twist inhaler once a day.  9. Albuterol inhaler daily.  10.Lasix unknown dose.  11.Zofran unknown dose.   REVIEW OF SYSTEMS:  Positive for fever.  No weight gain, weight loss, or  chills.  HEENT:  Fairly unremarkable.  RESPIRATORY:  Unremarkable.  GASTROINTESTINAL:  Problems with nausea, vomiting, and diarrhea.  GENITOURINARY:  Unremarkable.  MUSCULOSKELETAL:  Positive for some right  hand numbness.   PHYSICAL EXAMINATION:  GENERAL:  She is well-developed, well-nourished,  well-hydrated, and in no acute distress.  HEENT:  Head is normocephalic and atraumatic.  PERRLA.  EOMI.  NECK:  Soft, supple, nontender, and nondistended.  She does have some  conjunctival injection bilaterally.  CARDIOVASCULAR:  Regular rate and rhythm with no murmurs, rubs, or  gallops.  LUNGS:  Clear to auscultation bilaterally with no rales, rhonchi, or  wheezing.  ABDOMEN:  Soft, nontender, and nondistended with positive bowel sounds.  EXTREMITIES:  No cyanosis, clubbing, or edema.  NEUROLOGY:  Cranial nerves II-XII grossly intact.  The patient moves all  extremities.  The patient is complaining of some right hand numbness.  PSYCHIATRIC:  The patient has some anxiety on examination.   LABORATORY DATA:  Lactic acid was 1.3.  Microscopic urine; few squamous  epithelial cells, some rare bacteria.  Urinalysis negative for nitrites,  leukocytes.  Lipase 17.  Sodium 134, potassium 3.9, chloride 103, CO2  24, glucose 125, BUN 29, creatinine 2.12.  White count is 22,200,  hemoglobin 11.9, hematocrit 34.3, platelet count 85,000.  Rapid Strep  screen was negative.   RADIOLOGIC STUDIES:  CT of her  abdomen and pelvis showed:  1. Small lung nodules and calcified left hilar node suggests prior      granulomatous disease.  2. No acute intra-abdominal abnormalities.  3. Her pelvis showed no acute abnormalities.  Stable, benign-      appearing, round mass in the right pelvis with peripheral      calcification.   ASSESSMENT:  1. Nausea, vomiting, and diarrhea.  2. Leukocytosis of unknown etiology.  3. History of thrombocytopenia.  4. History of diabetes.  5. History of dysfunctional gut disorder.  6. History of transaminitis.  7. Renal insufficiency.   PLAN:  1. The patient will be admitted to the service of [facility] general      medical bed.  2. For leukocytosis, we will obtain blood and urine cultures at this      time.  We will also empirically start the patient on Levaquin IV      daily.  3. For diabetes, the patient will be placed on a moderate sliding      scale with blood sugars checked a.c. and h.s.  4. For nausea and vomiting, the patient has had no episodes of nausea      and vomiting for the last few hours.  She will be on antiemetics at      this time.  The patient will also be kept NPO and we will advance      her diet as tolerated.  5. For her renal insufficiency, we will IV hydrate the patient at this      time and depending on results, the patient may need a nephrology      consult.      Salem Caster, DO  Electronically Signed     SM/MEDQ  D:  10/15/2007  T:  10/15/2007  Job:  [id number]

## 2010-08-09 ENCOUNTER — Emergency Department (HOSPITAL_COMMUNITY)
Admission: EM | Admit: 2010-08-09 | Discharge: 2010-08-09 | Disposition: A | Discharge status: Home / Self Care | Payer: Medicare Other | Attending: Emergency Medicine | Admitting: Emergency Medicine

## 2010-08-09 DIAGNOSIS — M25569 Pain in unspecified knee: Secondary | ICD-10-CM | POA: Insufficient documentation

## 2010-08-10 NOTE — H&P (Signed)
Kristen Jones, BJERK NO.:  [id number]   MEDICAL RECORD NO.:  [id number]                   PATIENT TYPE:   LOCATION:                                       FACILITY:   PHYSICIAN:  Jamesetta So, M.D.               DATE OF BIRTH:  07-10-1950   DATE OF ADMISSION:  DATE OF DISCHARGE:                                HISTORY & PHYSICAL   CHIEF COMPLAINT:  Biliary colic secondary to cholelithiasis.   HISTORY OF PRESENT ILLNESS:  The patient is a 60 year old white female who  is referred for evaluation and treatment of biliary colic secondary to  cholelithiasis.  She has been having right upper quadrant abdominal pain  with radiation to the right flank and back, nausea, indigestion, bloating,  and fatty food intolerance over the past 6 months.  No fever, chills, or  jaundice have been noted.  Her symptoms seem to be worsening.   PAST MEDICAL HISTORY:  Past medical history includes extrinsic asthma, high  cholesterol levels, hypertension.   PAST SURGICAL HISTORY:  Knee surgery, carpal tunnel surgery, recent right  broken arm with cast which is to be removed in 3 days.   CURRENT MEDICATIONS:  1. Albuterol.  2. Sulfa 2 mg t.i.d.  3. Lipitor 1 tablet p.o. daily.  4. BuSpar 5 mg p.o. daily.  5. Prevacid 15 mg p.o. daily.  6. Alprazolam 1 mg p.o. q.h.s.  7. Norvasc 5 mg p.o. daily.   ALLERGIES:  Metoclopramide, Aciphex, fluoxetine, Prozac.   REVIEW OF SYSTEMS:  The patient does smoke daily.  She denies any  significant alcohol use.  She denies any other cardiac difficulties or  bleeding disorders.   PHYSICAL EXAMINATION:  GENERAL:  The patient is a well-developed well-  nourished white female in no acute distress.  VITAL SIGNS:  She is afebrile and vital signs are stable.  HEENT:  Examination reveals no scleral icterus.  LUNGS:  Clear to auscultation with equal breath sounds bilaterally.  HEART:  Heart examination reveals a regular rate and rhythm  without S3, S4,  or murmurs.  ABDOMEN:  The abdomen is soft and nondistended.  She is tender in the right  upper quadrant to palpation.  No hepatosplenomegaly, masses, or hernias are  identified.   X-RAY AND LABORATORY DATA:  Ultrasound of the gallbladder reveals  cholelithiasis.    IMPRESSION:  1. Biliary colic.  2. Cholelithiasis.   PLAN:  The patient is scheduled for a laparoscopic cholecystectomy on  06/14/2002.  The risks and benefits of the procedure including bleeding,  infection, hepatobiliary injury, and the possibility of an open procedure  were fully explained to the patient who gave informed consent.  Jamesetta So, M.D.    MAJ/MEDQ  D:  06/08/2002  T:  06/08/2002  Job:  [id number]   cc:   Florian Buff, M.D.  [address], [place]  [place] [address]  Fax: [place]. [place], M.D.  [address]  [place] [address]  Fax: [phone]

## 2010-08-10 NOTE — H&P (Signed)
Kristen Jones, Kristen Jones NO.:  [id number]   MEDICAL RECORD NO.:  [id number]                  PATIENT TYPE:   LOCATION:                                       FACILITY:   PHYSICIAN:  Jamesetta So, M.D.               DATE OF BIRTH:  11-06-1950   DATE OF ADMISSION:  DATE OF DISCHARGE:                                HISTORY & PHYSICAL   CHIEF COMPLAINT:  Need for screening colonoscopy.   HISTORY OF PRESENT ILLNESS:  The patient is a 60 year old white female who  was referred for endoscopic evaluation.  She needs a colonoscopy for  screening purposes.  No abdominal pain, weight loss, nausea, vomiting,  diarrhea, constipation, melena, or hematochezia have bee noted.  She has  never had a colonoscopy.  There is no family history of colon carcinoma.   PAST MEDICAL HISTORY:  Includes reflux, non-insulin-dependent diabetes  mellitus, extrinsic allergies.   PAST SURGICAL HISTORY:  Knee surgery, bone surgery, finger surgery,  laparoscopic cholecystectomy.   CURRENT MEDICATIONS:  Prevacid, BuSpar, Glucotrol, __________ , Flexeril,  Zyrtec, Flonase.   ALLERGIES:  METOCLOPRAMIDE, ACIPHEX, FLUOXETINE, PROZAC.   REVIEW OF SYSTEMS:  The patient does smoke daily.  She denies any alcohol  use.  She denies any nonsteroidal antiinflammatory use.   PHYSICAL EXAMINATION:  GENERAL:  On physical examination, the patient is a  well-developed well-nourished white female in no acute distress.  VITAL SIGNS:  She is afebrile and vital signs are stable.  LUNGS:  Clear to auscultation with equal breath sounds bilaterally.  HEART:  Heart examination reveals a regular rate and rhythm without S3, S4,  or murmurs.  ABDOMEN:  The abdomen is soft, nontender, nondistended.  No  hepatosplenomegaly or masses are noted.  RECTAL:  Examination was deferred to the procedure.   IMPRESSION:  Need for screening colonoscopy.   PLAN:  The patient is scheduled for a colonoscopy on  11/08/2003.  The risks  and benefits of the procedure including bleeding and perforation were fully  explained to the patient, who gave informed consent.     ___________________________________________                                         Jamesetta So, M.D.   MAJ/MEDQ  D:  10/27/2003  T:  10/27/2003  Job:  [id number]   cc:   Jamesetta So, M.D.  [address], [place]  [place] [address]  Fax: [place]. Moshe Cipro, M.D.  [address]  [place], [place] [address]  Fax: [phone]

## 2010-08-10 NOTE — Op Note (Signed)
NAME:  Kristen Jones, Kristen Jones                        ACCOUNT NO.:  [id number]   MEDICAL RECORD NO.:  [id number]                   PATIENT TYPE:  OBV   LOCATION:  A327                                 FACILITY:  [facility]   PHYSICIAN:  Jamesetta So, M.D.               DATE OF BIRTH:  November 23, 1950   DATE OF PROCEDURE:  06/14/2002  DATE OF DISCHARGE:                                 OPERATIVE REPORT   PREOPERATIVE DIAGNOSIS:  Cholecystitis, cholelithiasis.   POSTOPERATIVE DIAGNOSIS:  Cholecystitis, cholelithiasis.   OPERATION/PROCEDURE:  Laparoscopic cholecystectomy.   SURGEON:  Jamesetta So, M.D.   ASSISTANT:  Reita Cliche. Marnette Burgess, M.D.   ANESTHESIA:  General endotracheal anesthesia.   INDICATIONS:  The patient is a 60 year old white female who presents with  biliary colic secondary to cholelithiasis.  The risks and benefits of the  procedure including bleeding, infection, hepatobiliary injury, and the  possibility of an open procedure were fully explained to the patient who  gave informed consent.   DESCRIPTION OF PROCEDURE:  The patient was placed in the supine position.  After induction of general endotracheal anesthesia, the abdomen was prepped  and draped in the usual sterile technique with Betadine.  Surgical site  confirmation was performed.   An infraumbilical incision was made down to the fascia.  A Veress needle was  introduced into the abdominal cavity and confirmation of placement was done  using the saline drop test.  The abdomen was then insufflated to 16 mmHg  pressure.  An 11 mm trocar was introduced into the abdominal cavity under  direct visualization without difficulty.  An additional 11 mm trocar was  placed in the epigastric region and 5 mm trocars were placed in the right  upper quadrant and right flank regions.  Liver was inspected and noted to be  significantly adhesed to the underside of the abdominal wall.  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  The gallbladder was  retracted superiorly. The dissection was begun around the infundibulum of  the gallbladder.  The cystic duct was first identified.  Its juncture to the  infundibulum fully identified.  Endoclips were placed proximally and  distally on the cystic duct and the cystic duct was divided.  This is  likewise done with the cystic artery.  The gallbladder was then freed away  from the gallbladder fossa using Bovie electrocautery.  The gallbladder was  delivered to the epigastric trocar site using endocatch bag.  The  gallbladder fossa was inspected. No abnormal bleeding or bile leakage was  noted.  Surgicel was placed in the gallbladder fossa.  The subhepatic space  was then evacuated of all fluid in the area prior to removal of the trocars.   All wounds were irrigated with normal saline.  All wounds were injected with  plain 5% Sensorcaine.  The infraumbilical fascia was reapproximated using an  0 Vicryl interrupted suture.  All  skin incisions were closed using staples.  Betadine ointment and dry sterile dressings were applied.   All instrument and needle counts correct at the end of the procedure.  The  patient was extubated in the operating room and went back to the recovery  room awake in stable condition.  Complications - none.  Specimen -  gallbladder with stones.  Blood loss minimal.                                                Jamesetta So, M.D.    MAJ/MEDQ  D:  06/14/2002  T:  06/14/2002  Job:  [id number]   cc:   Vanetta Mulders. [place], M.D.  [address]  [place] [address]  Fax: [phone]   Keane Scrape, M.D.

## 2010-08-10 NOTE — Consult Note (Signed)
Kristen Jones, MCMURREY              ACCOUNT NO.:  [id number]   MEDICAL RECORD NO.:  [id number]          PATIENT TYPE:  AMB   LOCATION:                                FACILITY:  [facility]   PHYSICIAN:  Caro Hight, M.D.      DATE OF BIRTH:  11/11/1950   DATE OF CONSULTATION:  10/17/2005  DATE OF DISCHARGE:                                   CONSULTATION   Dr. Lorelee Market  [place], [place].   Dear Dr. Brunetta Genera:   I am seeing Kristen Jones as a new patient consultation per your request.  I am  seeing her for abdominal pain.   Kristen Jones is a 60 year old female who has had abdominal pain for  approximately six months.  It has remained at the same intensity and the  same frequency.  She may have the pain all day.  It may last as short as 10  minutes.  Milk helps with the pain.  It stays in the same place.  She denies  any nausea or vomiting.  It is located in her upper abdomen.  She has daily  bowel movements without straining.  She only has heartburn indigestion when  she misses her Omeprazole.  She denies any black stool.  She denies any over  the counter medicines.  Her weight has remained between 150 and 147 pounds.  She denies any travel.  She feels hungry and is often bloated.  She believes  that her symptoms are brought on by chocolate.  Very rarely does it occur  outside of eating.  It gets better when she goes to bed or holds it.  Sometimes burping and passing gas or having a bowel movement will make it  feel better.   PAST MEDICAL HISTORY:  Hypertension, GERD, scoliosis and hyperlipidemia.  She reports having an infected gallbladder requiring cholecystectomy,  bilateral knee, right carpal tunnel surgery.   ALLERGIES:  METOCLOPRAMIDE, ACIPHEX AND PROZAC.   MEDICATIONS:  She currently takes potassium and Nifedipine, Glipizide,  Omeprazole, Vytorin, hydrochlorothiazide, Claritin, Buspirone and magnesium.   FAMILY HISTORY:  She denies any family history of colon  cancer or colon  polyps.   SOCIAL HISTORY:  She smokes one pack a day and denies any alcohol use.   REVIEW OF SYSTEMS:  Per the HPI, otherwise all systems are negative.   PHYSICAL EXAMINATION:  VITAL SIGNS:  Weight 147 pounds, height 4 feet 10  inches, temperature 93, blood pressure 132/78, pulse 70.GENERAL:  She is in  no apparent distress, alert and oriented x4.  HEENT:  Atraumatic,  normocephalic.  Pupils equal and reactive to light.  Mouth:  No oral  lesions.  Posterior pharynx without erythema or exudate. LUNGS:  Clear to  auscultation bilaterally.CARDIOVASCULAR:  Regular rhythm, no murmur, normal  S1 and S2.ABDOMEN:  Bowel sounds aer present, soft, nontender, nondistended.  No rebound or guarding.  No hepatosplenomegaly.EXTREMITIES:  Without  cyanosis, clubbing or edema.  NEURO: She has no focal neurologic deficits.   [name] is a 60 year old female with abdominal pain and bloating.  The  differential diagnosis includes lactose  intolerance, bacterial overgrowth,  irritable bowel and a low likelihood of celiac sprue.  Thank you for  allowing me to see Kristen Jones in consultation.  My list of recommendations  followS.   I recommend a lactose-free diet and a gas/flatulence prevention diet.  She  is given a handout on both.  She will be scheduled for an upper endoscopy in  the next 7 to 10 days.  Will consider biopsy of the duodenum for celiac  sprue.  If her upper endoscopy is negative, I would consider a hydrogen  breath test.  She is asked to try Levsin sublingual 30 minutes before her  meals and at bedtime.  She is asked to add Citrucel daily.  She will follow  up in two months.  She reports having a colonoscopy in 2006 without any  polyps.  Her next screening colonoscopy should be in 2016.   Please feel free to contact me at [phone] with additional questions.      Caro Hight, M.D.  Electronically Signed     SM/MEDQ  D:  10/18/2005  T:  10/18/2005  Job:   [id number]   cc:   [place]  [place]

## 2010-08-10 NOTE — Op Note (Signed)
NAMEELVI, Kristen Jones              ACCOUNT NO.:  [id number]   MEDICAL RECORD NO.:  [id number]          PATIENT TYPE:  AMB   LOCATION:  DAY                           FACILITY:  [facility]   PHYSICIAN:  Caro Hight, M.D.      DATE OF BIRTH:  [birth year]   DATE OF PROCEDURE:  DATE OF DISCHARGE:                                  PROCEDURE NOTE   PROCEDURE:  Esophagogastroduodenoscopy with cold forceps biopsies.   MEDICATIONS:  1. Demerol 100 mg IV.  2. Versed 4 mg IV.   INDICATION FOR EXAM:  Kristen Jones is a 60 year old female with abdominal  pain and bloating.   FINDINGS:  1. A 3 mm duodenal polyp removed via cold forceps.  2. Biopsies obtained to evaluate for celiac sprue.  3. Normal esophagus without evidence of inflammation, mass, or Barrett's      esophagus.  4. Normal stomach and pylorus.   RECOMMENDATIONS:  1. Follow up biopsies.  2. The patient should continue Citrucel and Levsin.  She will follow up      with Dr. Caro Hight.   PROCEDURE TECHNIQUE:  Physical exam was performed and informed consent was  obtained from the patient after explaining all risks, benefits, and  alternatives to the procedure, which the patient appeared to understand and  so stated.  The patient was connected to the monitoring device and placed in  a left lateral position.  Continuous oxygen was provided by nasal cannula  and IV medicine administered through an indwelling catheter.  After  administration of sedation, the patient's esophagus was intubated and the  scope was advanced through direct visualization to the second portion of the  duodenum.  The scope was subsequently removed slowly while carefully  examining the color, texture, anatomy, and integrity of the mucosa on the  way out.  The patient was recovered in the endoscopy suite and discharged  home in satisfactory condition.      Caro Hight, M.D.  Electronically Signed     SM/MEDQ  D:  10/24/2005  T:  10/24/2005  Job:  [id number]

## 2010-09-07 ENCOUNTER — Emergency Department (HOSPITAL_COMMUNITY): Payer: Medicare Other

## 2010-09-07 ENCOUNTER — Emergency Department (HOSPITAL_COMMUNITY)
Admission: EM | Admit: 2010-09-07 | Discharge: 2010-09-07 | Disposition: A | Discharge status: Home / Self Care | Payer: Medicare Other | Attending: Emergency Medicine | Admitting: Emergency Medicine

## 2010-09-07 DIAGNOSIS — S40019A Contusion of unspecified shoulder, initial encounter: Secondary | ICD-10-CM | POA: Insufficient documentation

## 2010-09-07 DIAGNOSIS — K219 Gastro-esophageal reflux disease without esophagitis: Secondary | ICD-10-CM | POA: Insufficient documentation

## 2010-09-07 DIAGNOSIS — W010XXA Fall on same level from slipping, tripping and stumbling without subsequent striking against object, initial encounter: Secondary | ICD-10-CM | POA: Insufficient documentation

## 2010-09-07 DIAGNOSIS — E119 Type 2 diabetes mellitus without complications: Secondary | ICD-10-CM | POA: Insufficient documentation

## 2010-09-07 DIAGNOSIS — J45909 Unspecified asthma, uncomplicated: Secondary | ICD-10-CM | POA: Insufficient documentation

## 2010-09-07 DIAGNOSIS — M4120 Other idiopathic scoliosis, site unspecified: Secondary | ICD-10-CM | POA: Insufficient documentation

## 2010-09-07 DIAGNOSIS — I10 Essential (primary) hypertension: Secondary | ICD-10-CM | POA: Insufficient documentation

## 2010-09-07 DIAGNOSIS — S335XXA Sprain of ligaments of lumbar spine, initial encounter: Secondary | ICD-10-CM | POA: Insufficient documentation

## 2010-09-07 DIAGNOSIS — E785 Hyperlipidemia, unspecified: Secondary | ICD-10-CM | POA: Insufficient documentation

## 2010-10-04 ENCOUNTER — Emergency Department (HOSPITAL_COMMUNITY)
Admission: EM | Admit: 2010-10-04 | Discharge: 2010-10-04 | Disposition: A | Discharge status: Home / Self Care | Payer: Medicare Other | Attending: Emergency Medicine | Admitting: Emergency Medicine

## 2010-10-04 ENCOUNTER — Encounter: Payer: Self-pay | Admitting: *Deleted

## 2010-10-04 DIAGNOSIS — J449 Chronic obstructive pulmonary disease, unspecified: Secondary | ICD-10-CM | POA: Insufficient documentation

## 2010-10-04 DIAGNOSIS — E7800 Pure hypercholesterolemia, unspecified: Secondary | ICD-10-CM | POA: Insufficient documentation

## 2010-10-04 DIAGNOSIS — W19XXXA Unspecified fall, initial encounter: Secondary | ICD-10-CM | POA: Insufficient documentation

## 2010-10-04 DIAGNOSIS — I10 Essential (primary) hypertension: Secondary | ICD-10-CM | POA: Insufficient documentation

## 2010-10-04 DIAGNOSIS — S335XXA Sprain of ligaments of lumbar spine, initial encounter: Secondary | ICD-10-CM | POA: Insufficient documentation

## 2010-10-04 DIAGNOSIS — Z87891 Personal history of nicotine dependence: Secondary | ICD-10-CM | POA: Insufficient documentation

## 2010-10-04 DIAGNOSIS — J4489 Other specified chronic obstructive pulmonary disease: Secondary | ICD-10-CM | POA: Insufficient documentation

## 2010-10-04 DIAGNOSIS — E119 Type 2 diabetes mellitus without complications: Secondary | ICD-10-CM | POA: Insufficient documentation

## 2010-10-04 HISTORY — DX: Chronic obstructive pulmonary disease, unspecified: J44.9

## 2010-10-04 HISTORY — DX: Unspecified osteoarthritis, unspecified site: M19.90

## 2010-10-04 HISTORY — DX: Pure hypercholesterolemia, unspecified: E78.00

## 2010-10-04 HISTORY — DX: Essential (primary) hypertension: I10

## 2010-10-04 MED ORDER — HYDROCODONE-ACETAMINOPHEN 5-500 MG PO TABS: 1.0000 | ORAL_TABLET | Freq: Four times a day (QID) | ORAL | Status: AC | PRN

## 2010-10-04 MED ORDER — METHOCARBAMOL 500 MG PO TABS: ORAL_TABLET | ORAL | Status: DC

## 2010-10-04 NOTE — ED Provider Notes (Signed)
History     Chief Complaint  Patient presents with  . Back Pain   Patient is a 60 y.o. female presenting with back pain. The history is provided by the patient.  Back Pain  This is a new problem. The current episode started yesterday. The problem occurs constantly. The problem has not changed since onset.The pain is associated with falling. The pain is present in the lumbar spine. The pain does not radiate. The pain is at a severity of 10/10. The pain is severe. The symptoms are aggravated by certain positions. The pain is the same all the time. Stiffness is present all day. Pertinent negatives include no chest pain, no fever, no abdominal pain, no bowel incontinence, no bladder incontinence and no dysuria.    Past Medical History  Diagnosis Date  . Diabetes mellitus   . High cholesterol   . Hypertension   . Arthritis   . COPD (chronic obstructive pulmonary disease)     Past Surgical History  Procedure Date  . Knee surgery   . Cholecystectomy     History reviewed. No pertinent family history.  History  Substance Use Topics  . Smoking status: Former Research scientist (life sciences)  . Smokeless tobacco: Not on file  . Alcohol Use: No    OB History    Grav Para Term Preterm Abortions TAB SAB Ect Mult Living                  Review of Systems  Constitutional: Negative for fever and activity change.       All ROS Neg except as noted in HPI  HENT: Negative for nosebleeds and neck pain.   Eyes: Negative for photophobia and discharge.  Respiratory: Negative for cough, shortness of breath and wheezing.   Cardiovascular: Negative for chest pain and palpitations.  Gastrointestinal: Negative for abdominal pain, blood in stool and bowel incontinence.  Genitourinary: Negative for bladder incontinence, dysuria, frequency and hematuria.  Musculoskeletal: Positive for back pain and arthralgias.  Skin: Negative.   Neurological: Negative for dizziness, seizures and speech difficulty.    Psychiatric/Behavioral: Negative for hallucinations and confusion.    Physical Exam  BP 128/67  Pulse 84  Temp(Src) 97.5 F (36.4 C) (Oral)  Resp 16  Ht 5\' 6"  (1.676 m)  Wt 160 lb (72.576 kg)  BMI 25.82 kg/m2  SpO2 95%  Physical Exam  Nursing note and vitals reviewed. Constitutional: She is oriented to person, place, and time. She appears well-developed and well-nourished.  Non-toxic appearance.  HENT:  Head: Normocephalic.  Right Ear: Tympanic membrane and external ear normal.  Left Ear: Tympanic membrane and external ear normal.  Eyes: EOM and lids are normal. Pupils are equal, round, and reactive to light.  Neck: Normal range of motion. Neck supple. Carotid bruit is not present.  Cardiovascular: Normal rate, regular rhythm, normal heart sounds, intact distal pulses and normal pulses.   Pulmonary/Chest: Breath sounds normal. No respiratory distress.  Abdominal: Soft. Bowel sounds are normal. There is no tenderness. There is no guarding.  Musculoskeletal:       Lumbar back: She exhibits decreased range of motion, pain and spasm.  Lymphadenopathy:       Head (right side): No submandibular adenopathy present.       Head (left side): No submandibular adenopathy present.    She has no cervical adenopathy.  Neurological: She is alert and oriented to person, place, and time. She has normal strength. No cranial nerve deficit or sensory deficit.  Skin: Skin is  warm and dry.  Psychiatric: She has a normal mood and affect. Her speech is normal.    ED Course  Procedures  MDM I have reviewed nursing notes, vital signs, and all appropriate lab and imaging results for this patient.      Lenox Ahr, [place] 10/04/10 Einar Crow

## 2010-10-04 NOTE — ED Notes (Signed)
Lower back pain after falling last night.  Denies hitting head, denies LOC.  States her "bad leg gave way."

## 2010-10-09 NOTE — ED Provider Notes (Signed)
Medical screening examination/treatment/procedure(s) were conducted as a shared visit with non-physician practitioner(s) and myself.  I personally evaluated the patient during the encounter  Leota Jacobsen, MD 10/09/10 [phone]

## 2010-11-09 ENCOUNTER — Encounter (HOSPITAL_COMMUNITY): Payer: Self-pay | Admitting: *Deleted

## 2010-11-09 ENCOUNTER — Emergency Department (HOSPITAL_COMMUNITY)
Admission: EM | Admit: 2010-11-09 | Discharge: 2010-11-09 | Disposition: A | Discharge status: Home / Self Care | Payer: Medicare Other | Attending: Emergency Medicine | Admitting: Emergency Medicine

## 2010-11-09 ENCOUNTER — Emergency Department (HOSPITAL_COMMUNITY): Payer: Medicare Other

## 2010-11-09 DIAGNOSIS — I10 Essential (primary) hypertension: Secondary | ICD-10-CM | POA: Insufficient documentation

## 2010-11-09 DIAGNOSIS — J4489 Other specified chronic obstructive pulmonary disease: Secondary | ICD-10-CM | POA: Insufficient documentation

## 2010-11-09 DIAGNOSIS — E119 Type 2 diabetes mellitus without complications: Secondary | ICD-10-CM | POA: Insufficient documentation

## 2010-11-09 DIAGNOSIS — Z79899 Other long term (current) drug therapy: Secondary | ICD-10-CM | POA: Insufficient documentation

## 2010-11-09 DIAGNOSIS — E7800 Pure hypercholesterolemia, unspecified: Secondary | ICD-10-CM | POA: Insufficient documentation

## 2010-11-09 DIAGNOSIS — W19XXXA Unspecified fall, initial encounter: Secondary | ICD-10-CM | POA: Insufficient documentation

## 2010-11-09 DIAGNOSIS — J449 Chronic obstructive pulmonary disease, unspecified: Secondary | ICD-10-CM | POA: Insufficient documentation

## 2010-11-09 DIAGNOSIS — S335XXA Sprain of ligaments of lumbar spine, initial encounter: Secondary | ICD-10-CM | POA: Insufficient documentation

## 2010-11-09 MED ORDER — OXYCODONE HCL 5 MG PO TABS
5.0000 mg | ORAL_TABLET | Freq: Once | ORAL | Status: AC
  Administered 2010-11-09: 5 mg via ORAL
  Filled 2010-11-09: qty 1

## 2010-11-09 MED ORDER — ACETAMINOPHEN 500 MG PO TABS
1000.0000 mg | ORAL_TABLET | Freq: Once | ORAL | Status: AC
  Administered 2010-11-09: 1000 mg via ORAL
  Filled 2010-11-09: qty 2
  Filled 2010-11-09: qty 1

## 2010-11-09 MED ORDER — OXYCODONE-ACETAMINOPHEN 5-325 MG PO TABS: 1.0000 | ORAL_TABLET | ORAL | Status: AC | PRN

## 2010-11-09 MED ORDER — ALBUTEROL SULFATE HFA 108 (90 BASE) MCG/ACT IN AERS
2.0000 | INHALATION_SPRAY | Freq: Once | RESPIRATORY_TRACT | Status: AC
  Administered 2010-11-09: 2 via RESPIRATORY_TRACT
  Filled 2010-11-09: qty 6.7

## 2010-11-09 NOTE — ED Notes (Signed)
Patient returned from x ray at this time. NAD noted. Will continue to monitor.

## 2010-11-09 NOTE — ED Notes (Signed)
Pt c/o back pain after falling x 1 hr ago

## 2010-11-09 NOTE — ED Notes (Signed)
Pt ambulatory upon d/c. Pt with family member. Pt instructed not to drive on narcotics and stated that she would not. D/C instructions given and pt voiced understanding. PT was given opportunity to voice concerns or questions.

## 2010-11-13 NOTE — ED Provider Notes (Signed)
History     CSN: [id number] Arrival date & time: 11/09/2010  2:28 PM  Chief Complaint  Patient presents with  . Fall  . Back Pain   Patient is a 60 y.o. female presenting with fall and back pain. The history is provided by the patient.  Fall The accident occurred 1 to 2 hours ago. The fall occurred while walking (She tripped over an object in her home). She landed on a hard floor. Point of impact: buttocks. Pain location: lower back. The pain is at a severity of 8/10. The pain is moderate. She was ambulatory at the scene. Pertinent negatives include no fever, no numbness, no abdominal pain, no bowel incontinence, no nausea, no headaches, no loss of consciousness and no tingling. The symptoms are aggravated by activity, standing and pressure on the injury. She has tried nothing for the symptoms.  Back Pain  Pertinent negatives include no chest pain, no fever, no numbness, no headaches, no abdominal pain, no bowel incontinence, no tingling and no weakness.    Past Medical History  Diagnosis Date  . Diabetes mellitus   . High cholesterol   . Hypertension   . Arthritis   . COPD (chronic obstructive pulmonary disease)     Past Surgical History  Procedure Date  . Knee surgery   . Cholecystectomy     History reviewed. No pertinent family history.  History  Substance Use Topics  . Smoking status: Former Research scientist (life sciences)  . Smokeless tobacco: Not on file  . Alcohol Use: No    OB History    Grav Para Term Preterm Abortions TAB SAB Ect Mult Living                  Review of Systems  Constitutional: Negative for fever.  HENT: Negative for congestion, sore throat and neck pain.   Eyes: Negative.   Respiratory: Negative for chest tightness and shortness of breath.   Cardiovascular: Negative for chest pain.  Gastrointestinal: Negative for nausea, abdominal pain and bowel incontinence.  Genitourinary: Negative.   Musculoskeletal: Positive for back pain and arthralgias. Negative for joint  swelling.  Skin: Negative.  Negative for rash and wound.  Neurological: Negative for dizziness, tingling, loss of consciousness, weakness, light-headedness, numbness and headaches.  Hematological: Negative.   Psychiatric/Behavioral: Negative.     Physical Exam  BP 117/51  Pulse 64  Temp(Src) 98 F (36.7 C) (Oral)  Resp 18  Ht 4\' 10"  (1.473 m)  Wt 140 lb (63.504 kg)  BMI 29.26 kg/m2  SpO2 100%  Physical Exam  Nursing note and vitals reviewed. Constitutional: She is oriented to person, place, and time. She appears well-developed and well-nourished.  HENT:  Head: Normocephalic.  Eyes: Conjunctivae are normal.  Neck: Normal range of motion. Neck supple.  Cardiovascular: Regular rhythm and intact distal pulses.        Pedal pulses normal.  Pulmonary/Chest: Effort normal. She has no wheezes.  Abdominal: Soft. Bowel sounds are normal. She exhibits no distension and no mass.  Musculoskeletal: Normal range of motion. She exhibits no edema.       Lumbar back: She exhibits tenderness. She exhibits no swelling, no edema and no spasm.  Neurological: She is alert and oriented to person, place, and time. She has normal strength. She displays no atrophy and no tremor. No cranial nerve deficit or sensory deficit. Gait normal.  Reflex Scores:      Patellar reflexes are 2+ on the right side and 2+ on the left side.  Achilles reflexes are 2+ on the right side and 2+ on the left side.      No strength deficit noted in hip and knee flexor and extensor muscle groups.  Ankle flexion and extension intact.  Skin: Skin is warm and dry.  Psychiatric: She has a normal mood and affect.    ED Course  Procedures  MDM Patients labs and/or radiological studies were reviewed during the medical decision making and disposition process.        Fulton Reek, PA 11/13/10 1126

## 2010-11-13 NOTE — ED Provider Notes (Addendum)
History     CSN: [id number] Arrival date & time: 11/09/2010  2:28 PM  Chief Complaint  Patient presents with  . Fall  . Back Pain   HPI  Past Medical History  Diagnosis Date  . Diabetes mellitus   . High cholesterol   . Hypertension   . Arthritis   . COPD (chronic obstructive pulmonary disease)     Past Surgical History  Procedure Date  . Knee surgery   . Cholecystectomy     History reviewed. No pertinent family history.  History  Substance Use Topics  . Smoking status: Former Research scientist (life sciences)  . Smokeless tobacco: Not on file  . Alcohol Use: No    OB History    Grav Para Term Preterm Abortions TAB SAB Ect Mult Living                  Review of Systems  Physical Exam  BP 117/51  Pulse 64  Temp(Src) 98 F (36.7 C) (Oral)  Resp 18  Ht 4\' 10"  (1.473 m)  Wt 140 lb (63.504 kg)  BMI 29.26 kg/m2  SpO2 100%  Physical Exam  ED Course  Procedures  MDM     Medical screening examination/treatment/procedure(s) were performed by non-physician practitioner and as supervising physician I was immediately available for consultation/collaboration.  Nat Christen, MD 11/13/10 [place], MD 11/19/10 [phone]

## 2010-11-13 NOTE — ED Notes (Signed)
Medical screening examination/treatment/procedure(s) were performed by non-physician practitioner and as supervising physician I was immediately available for consultation/collaboration.  Nat Christen, MD 11/13/10 [phone]

## 2010-11-19 NOTE — ED Provider Notes (Signed)
Medical screening examination/treatment/procedure(s) were performed by non-physician practitioner and as supervising physician I was immediately available for consultation/collaboration.  Nat Christen, MD 11/19/10 [phone]

## 2010-12-06 ENCOUNTER — Emergency Department (HOSPITAL_COMMUNITY): Payer: Medicare Other

## 2010-12-06 ENCOUNTER — Emergency Department (HOSPITAL_COMMUNITY)
Admission: EM | Admit: 2010-12-06 | Discharge: 2010-12-06 | Disposition: A | Discharge status: Home / Self Care | Payer: Medicare Other | Attending: Emergency Medicine | Admitting: Emergency Medicine

## 2010-12-06 ENCOUNTER — Encounter (HOSPITAL_COMMUNITY): Payer: Self-pay | Admitting: Emergency Medicine

## 2010-12-06 DIAGNOSIS — S40019A Contusion of unspecified shoulder, initial encounter: Secondary | ICD-10-CM | POA: Insufficient documentation

## 2010-12-06 DIAGNOSIS — Z87891 Personal history of nicotine dependence: Secondary | ICD-10-CM | POA: Insufficient documentation

## 2010-12-06 DIAGNOSIS — J449 Chronic obstructive pulmonary disease, unspecified: Secondary | ICD-10-CM | POA: Insufficient documentation

## 2010-12-06 DIAGNOSIS — J4489 Other specified chronic obstructive pulmonary disease: Secondary | ICD-10-CM | POA: Insufficient documentation

## 2010-12-06 DIAGNOSIS — Y92009 Unspecified place in unspecified non-institutional (private) residence as the place of occurrence of the external cause: Secondary | ICD-10-CM | POA: Insufficient documentation

## 2010-12-06 DIAGNOSIS — W010XXA Fall on same level from slipping, tripping and stumbling without subsequent striking against object, initial encounter: Secondary | ICD-10-CM | POA: Insufficient documentation

## 2010-12-06 DIAGNOSIS — E119 Type 2 diabetes mellitus without complications: Secondary | ICD-10-CM | POA: Insufficient documentation

## 2010-12-06 MED ORDER — PROMETHAZINE HCL 12.5 MG PO TABS
12.5000 mg | ORAL_TABLET | Freq: Once | ORAL | Status: AC
  Administered 2010-12-06: 12.5 mg via ORAL
  Filled 2010-12-06: qty 1

## 2010-12-06 MED ORDER — HYDROCODONE-ACETAMINOPHEN 5-325 MG PO TABS: 2.0000 | ORAL_TABLET | ORAL | Status: AC | PRN

## 2010-12-06 MED ORDER — HYDROCODONE-ACETAMINOPHEN 5-325 MG PO TABS
1.0000 | ORAL_TABLET | Freq: Once | ORAL | Status: AC
  Administered 2010-12-06: 1 via ORAL
  Filled 2010-12-06: qty 1

## 2010-12-06 NOTE — ED Provider Notes (Signed)
Medical screening examination/treatment/procedure(s) were performed by non-physician practitioner and as supervising physician I was immediately available for consultation/collaboration.  Elmer Picker, MD 12/06/10 [phone]

## 2010-12-06 NOTE — ED Provider Notes (Signed)
History     CSN: [id number] Arrival date & time: 12/06/2010  2:00 PM   Chief Complaint  Patient presents with  . fall/ right arm pain      (Include location/radiation/quality/duration/timing/severity/associated sxs/prior treatment) HPI Comments: Pt states she was walking and fell this AM. No LOC. She c/o pain of the right shoulder and upper arm.  Patient is a 60 y.o. female presenting with shoulder injury.  Shoulder Injury This is a new problem. The current episode started today. The problem has been unchanged. Associated symptoms include arthralgias. Pertinent negatives include no abdominal pain, chest pain, chills, congestion, coughing, fever, nausea, neck pain or vomiting. Exacerbated by: certain movement. She has tried nothing for the symptoms.     Past Medical History  Diagnosis Date  . Diabetes mellitus   . High cholesterol   . Hypertension   . Arthritis   . COPD (chronic obstructive pulmonary disease)      Past Surgical History  Procedure Date  . Knee surgery   . Cholecystectomy     No family history on file.  History  Substance Use Topics  . Smoking status: Former Research scientist (life sciences)  . Smokeless tobacco: Not on file  . Alcohol Use: No    OB History    Grav Para Term Preterm Abortions TAB SAB Ect Mult Living                  Review of Systems  Constitutional: Negative for fever, chills and activity change.       All ROS Neg except as noted in HPI  HENT: Positive for hearing loss. Negative for nosebleeds, congestion and neck pain.   Eyes: Negative for photophobia and discharge.  Respiratory: Negative for cough, shortness of breath and wheezing.   Cardiovascular: Negative for chest pain and palpitations.  Gastrointestinal: Negative for nausea, vomiting, abdominal pain and blood in stool.  Genitourinary: Negative for dysuria, frequency and hematuria.  Musculoskeletal: Positive for arthralgias. Negative for back pain.  Skin: Negative.   Neurological: Negative for  dizziness, seizures and speech difficulty.  Psychiatric/Behavioral: Negative for hallucinations and confusion.    Allergies  Fluoxetine hcl; Metoclopramide hcl; Rabeprazole sodium; and Robaxin  Home Medications   Current Outpatient Rx  Name Route Sig Dispense Refill  . IPRATROPIUM-ALBUTEROL 18-103 MCG/ACT IN AERO Inhalation Inhale 2 puffs into the lungs every 4 (four) hours as needed. For shortness of breath     . AMLODIPINE BESYLATE 10 MG PO TABS Oral Take 10 mg by mouth daily.      Marland Kitchen VITAMIN D 1000 UNITS PO TABS Oral Take 1,000 Units by mouth daily.      Marland Kitchen EZETIMIBE 10 MG PO TABS Oral Take 10 mg by mouth daily.      . FENOFIBRATE 160 MG PO TABS Oral Take 160 mg by mouth daily.      . FUROSEMIDE 20 MG PO TABS Oral Take 20 mg by mouth daily.      . GLYBURIDE-METFORMIN 2.5-500 MG PO TABS Oral Take 1 tablet by mouth 2 (two) times daily with a meal.      . OMEPRAZOLE 20 MG PO CPDR Oral Take 20 mg by mouth daily.      Marland Kitchen PAROXETINE HCL 20 MG PO TABS Oral Take 20 mg by mouth every morning.      Marland Kitchen POTASSIUM CHLORIDE 10 MEQ PO TBCR Oral Take 10 mEq by mouth daily.      . TRIAMCINOLONE ACETONIDE 0.1 % EX CREA Topical Apply 1 application topically  2 (two) times daily.      Marland Kitchen METHOCARBAMOL 500 MG PO TABS  2 tabs po tid for spasm/pain 30 tablet 0  . UNKNOWN TO PATIENT         Physical Exam    BP 119/61  Pulse 73  Temp(Src) 97.2 F (36.2 C) (Oral)  Resp 16  Ht 4\' 10"  (1.473 m)  Wt 137 lb (62.143 kg)  BMI 28.63 kg/m2  SpO2 98%  Physical Exam  Nursing note and vitals reviewed. Constitutional: She is oriented to person, place, and time. She appears well-developed and well-nourished.  Non-toxic appearance.  HENT:  Head: Normocephalic.  Right Ear: Tympanic membrane and external ear normal.  Left Ear: Tympanic membrane and external ear normal.  Eyes: EOM and lids are normal. Pupils are equal, round, and reactive to light.       Pt wears hearing aids. She did not bring them and is having  difficulty hearing.  Neck: Normal range of motion. Neck supple. Carotid bruit is not present.  Cardiovascular: Normal rate, regular rhythm, normal heart sounds, intact distal pulses and normal pulses.   Pulmonary/Chest: Breath sounds normal. No respiratory distress.  Abdominal: Soft. Bowel sounds are normal. There is no tenderness. There is no guarding.  Genitourinary: Guaiac stool: Pain.  Musculoskeletal: Normal range of motion.       Pain with attempted ROM of the right shoulder, and palpation of the humerus area. Good cap refill and sensory. Radial pulse wnl.  Lymphadenopathy:       Head (right side): No submandibular adenopathy present.       Head (left side): No submandibular adenopathy present.    She has no cervical adenopathy.  Neurological: She is alert and oriented to person, place, and time. She has normal strength. No cranial nerve deficit or sensory deficit.  Skin: Skin is warm and dry.  Psychiatric: She has a normal mood and affect. Her speech is normal.    ED Course: Plan - sling. Tylenol for mild pain. Norco for more severe pain. Orthopedic evaluation.  Procedures  Results for orders placed during the hospital encounter of 12/09/09  GLUCOSE, CAPILLARY      Component Value Range   Glucose-Capillary 43 (*) 70 - 99 (mg/dL)   Comment 1 Orig pt id entered as [id number]    GLUCOSE, CAPILLARY      Component Value Range   Glucose-Capillary 61 (*) 70 - 99 (mg/dL)  BASIC METABOLIC PANEL      Component Value Range   Sodium 137  135 - 145 (mEq/L)   Potassium 4.3  3.5 - 5.1 (mEq/L)   Chloride 109  96 - 112 (mEq/L)   CO2 22  19 - 32 (mEq/L)   Glucose, Bld 46 (*) 70 - 99 (mg/dL)   BUN 14  6 - 23 (mg/dL)   Creatinine, Ser 0.65  0.4 - 1.2 (mg/dL)   Calcium 8.9  8.4 - 10.5 (mg/dL)   GFR calc non Af Amer >60  >60 (mL/min)   GFR calc Af Amer    >60 (mL/min)   Value: >60            The eGFR has been calculated     using the MDRD equation.     This calculation has not been      validated in all clinical     situations.     eGFR's persistently     <60 mL/min signify     possible Chronic Kidney Disease.  CBC  Component Value Range   WBC 7.9  4.0 - 10.5 (K/uL)   RBC 3.19 (*) 3.87 - 5.11 (MIL/uL)   Hemoglobin 9.9 (*) 12.0 - 15.0 (g/dL)   HCT 29.0 (*) 36.0 - 46.0 (%)   MCV 90.9  78.0 - 100.0 (fL)   MCH 30.9  26.0 - 34.0 (pg)   MCHC 34.0  30.0 - 36.0 (g/dL)   RDW 14.6  11.5 - 15.5 (%)   Platelets 172  150 - 400 (K/uL)  DIFFERENTIAL      Component Value Range   Neutrophils Relative 83 (*) 43 - 77 (%)   Neutro Abs 6.6  1.7 - 7.7 (K/uL)   Lymphocytes Relative 11 (*) 12 - 46 (%)   Lymphs Abs 0.8  0.7 - 4.0 (K/uL)   Monocytes Relative 5  3 - 12 (%)   Monocytes Absolute 0.4  0.1 - 1.0 (K/uL)   Eosinophils Relative 1  0 - 5 (%)   Eosinophils Absolute 0.0  0.0 - 0.7 (K/uL)   Basophils Relative 0  0 - 1 (%)   Basophils Absolute 0.0  0.0 - 0.1 (K/uL)  URINALYSIS, ROUTINE W REFLEX MICROSCOPIC      Component Value Range   Color, Urine YELLOW  YELLOW    Appearance CLEAR  CLEAR    Specific Gravity, Urine <1.005 (*) 1.005 - 1.030    pH 6.0  5.0 - 8.0    Glucose, UA 500 (*) NEGATIVE (mg/dL)   Hgb urine dipstick NEGATIVE  NEGATIVE    Bilirubin Urine NEGATIVE  NEGATIVE    Ketones, ur NEGATIVE  NEGATIVE (mg/dL)   Protein, ur NEGATIVE  NEGATIVE (mg/dL)   Urobilinogen, UA 0.2  0.0 - 1.0 (mg/dL)   Nitrite NEGATIVE  NEGATIVE    Leukocytes, UA    NEGATIVE    Value: NEGATIVE MICROSCOPIC NOT DONE ON URINES WITH NEGATIVE PROTEIN, BLOOD, LEUKOCYTES, NITRITE, OR GLUCOSE <1000 mg/dL.  GLUCOSE, CAPILLARY      Component Value Range   Glucose-Capillary 223 (*) 70 - 99 (mg/dL)  GLUCOSE, CAPILLARY      Component Value Range   Glucose-Capillary 199 (*) 70 - 99 (mg/dL)  GLUCOSE, CAPILLARY      Component Value Range   Glucose-Capillary 166 (*) 70 - 99 (mg/dL)   Dg Lumbar Spine Complete  11/09/2010  *RADIOLOGY REPORT*  Clinical Data: Fall.  Pain.  LUMBAR SPINE - COMPLETE 4+  VIEW  Comparison: 09/07/2010  Findings: Surgical clips are present in the right upper quadrant of the abdomen.  There are four non-rib bearing vertebral bodies. Alignment is normal.  There are degenerative changes at multiple levels.  No evidence for acute fracture, subluxation.  No spondylolisthesis or spondylolysis.  Regional bowel gas pattern is nonobstructive. Coarse calcification in the right hemi pelvis consistent with calcified uterine fibroid.  IMPRESSION:  1.  Mild degenerative changes. 2. No evidence for acute  abnormality.  Original Report Authenticated By: Glenice Bow, M.D.     Dx: Contusion right shoulder  MDM I have reviewed nursing notes, vital signs, and all appropriate lab and imaging results for this patient.  Results for orders placed during the hospital encounter of 12/09/09  GLUCOSE, CAPILLARY      Component Value Range   Glucose-Capillary 43 (*) 70 - 99 (mg/dL)   Comment 1 Orig pt id entered as [id number]    GLUCOSE, CAPILLARY      Component Value Range   Glucose-Capillary 61 (*) 70 - 99 (mg/dL)  BASIC METABOLIC PANEL  Component Value Range   Sodium 137  135 - 145 (mEq/L)   Potassium 4.3  3.5 - 5.1 (mEq/L)   Chloride 109  96 - 112 (mEq/L)   CO2 22  19 - 32 (mEq/L)   Glucose, Bld 46 (*) 70 - 99 (mg/dL)   BUN 14  6 - 23 (mg/dL)   Creatinine, Ser 0.65  0.4 - 1.2 (mg/dL)   Calcium 8.9  8.4 - 10.5 (mg/dL)   GFR calc non Af Amer >60  >60 (mL/min)   GFR calc Af Amer    >60 (mL/min)   Value: >60            The eGFR has been calculated     using the MDRD equation.     This calculation has not been     validated in all clinical     situations.     eGFR's persistently     <60 mL/min signify     possible Chronic Kidney Disease.  CBC      Component Value Range   WBC 7.9  4.0 - 10.5 (K/uL)   RBC 3.19 (*) 3.87 - 5.11 (MIL/uL)   Hemoglobin 9.9 (*) 12.0 - 15.0 (g/dL)   HCT 29.0 (*) 36.0 - 46.0 (%)   MCV 90.9  78.0 - 100.0 (fL)   MCH 30.9  26.0 - 34.0 (pg)    MCHC 34.0  30.0 - 36.0 (g/dL)   RDW 14.6  11.5 - 15.5 (%)   Platelets 172  150 - 400 (K/uL)  DIFFERENTIAL      Component Value Range   Neutrophils Relative 83 (*) 43 - 77 (%)   Neutro Abs 6.6  1.7 - 7.7 (K/uL)   Lymphocytes Relative 11 (*) 12 - 46 (%)   Lymphs Abs 0.8  0.7 - 4.0 (K/uL)   Monocytes Relative 5  3 - 12 (%)   Monocytes Absolute 0.4  0.1 - 1.0 (K/uL)   Eosinophils Relative 1  0 - 5 (%)   Eosinophils Absolute 0.0  0.0 - 0.7 (K/uL)   Basophils Relative 0  0 - 1 (%)   Basophils Absolute 0.0  0.0 - 0.1 (K/uL)  URINALYSIS, ROUTINE W REFLEX MICROSCOPIC      Component Value Range   Color, Urine YELLOW  YELLOW    Appearance CLEAR  CLEAR    Specific Gravity, Urine <1.005 (*) 1.005 - 1.030    pH 6.0  5.0 - 8.0    Glucose, UA 500 (*) NEGATIVE (mg/dL)   Hgb urine dipstick NEGATIVE  NEGATIVE    Bilirubin Urine NEGATIVE  NEGATIVE    Ketones, ur NEGATIVE  NEGATIVE (mg/dL)   Protein, ur NEGATIVE  NEGATIVE (mg/dL)   Urobilinogen, UA 0.2  0.0 - 1.0 (mg/dL)   Nitrite NEGATIVE  NEGATIVE    Leukocytes, UA    NEGATIVE    Value: NEGATIVE MICROSCOPIC NOT DONE ON URINES WITH NEGATIVE PROTEIN, BLOOD, LEUKOCYTES, NITRITE, OR GLUCOSE <1000 mg/dL.  GLUCOSE, CAPILLARY      Component Value Range   Glucose-Capillary 223 (*) 70 - 99 (mg/dL)  GLUCOSE, CAPILLARY      Component Value Range   Glucose-Capillary 199 (*) 70 - 99 (mg/dL)  GLUCOSE, CAPILLARY      Component Value Range   Glucose-Capillary 166 (*) 70 - 99 (mg/dL)   Dg Lumbar Spine Complete  11/09/2010  *RADIOLOGY REPORT*  Clinical Data: Fall.  Pain.  LUMBAR SPINE - COMPLETE 4+ VIEW  Comparison: 09/07/2010  Findings: Surgical clips are present  in the right upper quadrant of the abdomen.  There are four non-rib bearing vertebral bodies. Alignment is normal.  There are degenerative changes at multiple levels.  No evidence for acute fracture, subluxation.  No spondylolisthesis or spondylolysis.  Regional bowel gas pattern is nonobstructive.  Coarse calcification in the right hemi pelvis consistent with calcified uterine fibroid.  IMPRESSION:  1.  Mild degenerative changes. 2. No evidence for acute  abnormality.  Original Report Authenticated By: Glenice Bow, M.D.   Dg Shoulder Right  12/06/2010  *RADIOLOGY REPORT*  Clinical Data: Pain after fall  RIGHT SHOULDER - 2+ VIEW  Comparison: September 07, 2010  Findings: [facility] joint is intact and the subacromial space is maintained.  There is no evidence of fracture or dislocation.  IMPRESSION: Stable right shoulder with no evidence of acute abnormality.  Original Report Authenticated By: Duayne Cal, M.D.   Dg Humerus Right  12/06/2010  *RADIOLOGY REPORT*  Clinical Data: Pain after fall  RIGHT HUMERUS - 2+ VIEW  Comparison: None.  Findings: There is no evidence of fracture or dislocation.  No osseous lesions or periosteal reaction are identified.  IMPRESSION: Negative right humerus.  Original Report Authenticated By: Duayne Cal, M.D.        Lenox Ahr, [place] 12/06/10 [phone]

## 2010-12-06 NOTE — ED Notes (Signed)
Pt states she tripped and fell @~ 0900. Pain to right arm.

## 2010-12-06 NOTE — ED Provider Notes (Addendum)
Medical screening examination/treatment/procedure(s) were performed by non-physician practitioner and as supervising physician I was immediately available for consultation/collaboration.  Elmer Picker, MD 12/06/10 [place], MD 12/06/10 1753

## 2010-12-20 LAB — HEPATITIS PANEL, ACUTE
HCV Ab: NEGATIVE
Hep A IgM: NEGATIVE
Hep B C IgM: NEGATIVE
Hepatitis B Surface Ag: NEGATIVE

## 2010-12-20 LAB — URINALYSIS, ROUTINE W REFLEX MICROSCOPIC
Bilirubin Urine: NEGATIVE
Ketones, ur: NEGATIVE
Nitrite: NEGATIVE
pH: 5.5

## 2010-12-20 LAB — CBC
HCT: 29.3 — ABNORMAL LOW
HCT: 29.3 — ABNORMAL LOW
HCT: 34 — ABNORMAL LOW
Hemoglobin: 10.4 — ABNORMAL LOW
MCHC: 35.3
MCHC: 35.7
MCHC: 36.2 — ABNORMAL HIGH
MCV: 86.1
MCV: 87.6
MCV: 88.5
Platelets: 51 — ABNORMAL LOW
Platelets: 63 — ABNORMAL LOW
RBC: 3.32 — ABNORMAL LOW
RBC: 3.35 — ABNORMAL LOW
RDW: 13.8
RDW: 14.3
WBC: 2.5 — ABNORMAL LOW
WBC: 3.9 — ABNORMAL LOW

## 2010-12-20 LAB — DIFFERENTIAL
Basophils Absolute: 0
Basophils Absolute: 0
Basophils Relative: 1
Basophils Relative: 1
Eosinophils Absolute: 0.2
Eosinophils Absolute: 0.2
Eosinophils Relative: 5
Eosinophils Relative: 7 — ABNORMAL HIGH
Eosinophils Relative: 8 — ABNORMAL HIGH
Lymphocytes Relative: 32
Lymphocytes Relative: 42
Lymphocytes Relative: 51 — ABNORMAL HIGH
Lymphs Abs: 1.3
Lymphs Abs: 1.6
Monocytes Absolute: 0.1
Monocytes Relative: 2 — ABNORMAL LOW
Neutro Abs: 0.9 — ABNORMAL LOW
Neutro Abs: 2
Neutrophils Relative %: 36 — ABNORMAL LOW
Neutrophils Relative %: 51
Neutrophils Relative %: 56

## 2010-12-20 LAB — COMPREHENSIVE METABOLIC PANEL
AST: 112 — ABNORMAL HIGH
Albumin: 3 — ABNORMAL LOW
BUN: 24 — ABNORMAL HIGH
BUN: 31 — ABNORMAL HIGH
CO2: 20
Calcium: 7.1 — ABNORMAL LOW
Chloride: 109
Creatinine, Ser: 1.44 — ABNORMAL HIGH
Creatinine, Ser: 1.8 — ABNORMAL HIGH
GFR calc Af Amer: 46 — ABNORMAL LOW
GFR calc non Af Amer: 38 — ABNORMAL LOW
Glucose, Bld: 113 — ABNORMAL HIGH
Total Bilirubin: 0.5
Total Protein: 5.6 — ABNORMAL LOW

## 2010-12-20 LAB — URINE CULTURE: Colony Count: 1000

## 2010-12-20 LAB — URINE MICROSCOPIC-ADD ON

## 2010-12-20 LAB — HEPATIC FUNCTION PANEL
ALT: 78 — ABNORMAL HIGH
AST: 91 — ABNORMAL HIGH
Albumin: 2.3 — ABNORMAL LOW
Alkaline Phosphatase: 31 — ABNORMAL LOW
Bilirubin, Direct: 0.2
Indirect Bilirubin: 0.6
Total Bilirubin: 0.8
Total Protein: 4.5 — ABNORMAL LOW

## 2010-12-20 LAB — PROTIME-INR
INR: 0.9
Prothrombin Time: 12.1

## 2010-12-20 LAB — ETHANOL: Alcohol, Ethyl (B): <5

## 2010-12-20 LAB — INFLUENZA A+B VIRUS AG-DIRECT(RAPID)

## 2010-12-20 LAB — LIPASE, BLOOD: Lipase: 28

## 2010-12-21 ENCOUNTER — Emergency Department (HOSPITAL_COMMUNITY): Payer: Medicare Other

## 2010-12-21 ENCOUNTER — Encounter (HOSPITAL_COMMUNITY): Payer: Self-pay | Admitting: *Deleted

## 2010-12-21 ENCOUNTER — Emergency Department (HOSPITAL_COMMUNITY)
Admission: EM | Admit: 2010-12-21 | Discharge: 2010-12-21 | Disposition: A | Discharge status: Home / Self Care | Payer: Medicare Other | Attending: Emergency Medicine | Admitting: Emergency Medicine

## 2010-12-21 DIAGNOSIS — J449 Chronic obstructive pulmonary disease, unspecified: Secondary | ICD-10-CM | POA: Insufficient documentation

## 2010-12-21 DIAGNOSIS — E119 Type 2 diabetes mellitus without complications: Secondary | ICD-10-CM | POA: Insufficient documentation

## 2010-12-21 DIAGNOSIS — M545 Low back pain, unspecified: Secondary | ICD-10-CM | POA: Insufficient documentation

## 2010-12-21 DIAGNOSIS — J4489 Other specified chronic obstructive pulmonary disease: Secondary | ICD-10-CM | POA: Insufficient documentation

## 2010-12-21 DIAGNOSIS — W19XXXA Unspecified fall, initial encounter: Secondary | ICD-10-CM | POA: Insufficient documentation

## 2010-12-21 DIAGNOSIS — S20229A Contusion of unspecified back wall of thorax, initial encounter: Secondary | ICD-10-CM | POA: Insufficient documentation

## 2010-12-21 DIAGNOSIS — E7800 Pure hypercholesterolemia, unspecified: Secondary | ICD-10-CM | POA: Insufficient documentation

## 2010-12-21 DIAGNOSIS — Y92009 Unspecified place in unspecified non-institutional (private) residence as the place of occurrence of the external cause: Secondary | ICD-10-CM | POA: Insufficient documentation

## 2010-12-21 DIAGNOSIS — I10 Essential (primary) hypertension: Secondary | ICD-10-CM | POA: Insufficient documentation

## 2010-12-21 DIAGNOSIS — M129 Arthropathy, unspecified: Secondary | ICD-10-CM | POA: Insufficient documentation

## 2010-12-21 DIAGNOSIS — Z87891 Personal history of nicotine dependence: Secondary | ICD-10-CM | POA: Insufficient documentation

## 2010-12-21 DIAGNOSIS — S335XXA Sprain of ligaments of lumbar spine, initial encounter: Secondary | ICD-10-CM | POA: Insufficient documentation

## 2010-12-21 LAB — BASIC METABOLIC PANEL
BUN: 21
BUN: 27 — ABNORMAL HIGH
CO2: 21
CO2: 21
CO2: 22
Chloride: 108
Chloride: 110
Chloride: 112
Creatinine, Ser: 1.68 — ABNORMAL HIGH
GFR calc Af Amer: 38 — ABNORMAL LOW
GFR calc Af Amer: 51 — ABNORMAL LOW
GFR calc Af Amer: >60
Potassium: 3.6
Potassium: 3.8
Potassium: 3.8
Sodium: 139

## 2010-12-21 LAB — DIFFERENTIAL
Basophils Absolute: 0
Basophils Relative: 0
Basophils Relative: 0
Eosinophils Absolute: 0.3
Eosinophils Absolute: 0.4
Eosinophils Relative: 0
Eosinophils Relative: 1
Eosinophils Relative: 2
Lymphocytes Relative: 3 — ABNORMAL LOW
Lymphs Abs: 0.7
Lymphs Abs: 0.9
Lymphs Abs: 1
Lymphs Abs: 1.2
Monocytes Absolute: 0.3
Monocytes Relative: 0 — ABNORMAL LOW
Monocytes Relative: 3
Monocytes Relative: 4
Monocytes Relative: 5
Neutro Abs: 20.6 — ABNORMAL HIGH
Neutrophils Relative %: 69
Neutrophils Relative %: 86 — ABNORMAL HIGH

## 2010-12-21 LAB — COMPREHENSIVE METABOLIC PANEL
ALT: 27
AST: 37
Albumin: 2.9 — ABNORMAL LOW
Alkaline Phosphatase: 28 — ABNORMAL LOW
Calcium: 8.2 — ABNORMAL LOW
GFR calc Af Amer: 29 — ABNORMAL LOW
Glucose, Bld: 125 — ABNORMAL HIGH
Potassium: 3.9
Sodium: 134 — ABNORMAL LOW
Total Protein: 5.2 — ABNORMAL LOW

## 2010-12-21 LAB — CBC
HCT: 30.6 — ABNORMAL LOW
HCT: 30.7 — ABNORMAL LOW
HCT: 39.9
Hemoglobin: 10.4 — ABNORMAL LOW
Hemoglobin: 11.9 — ABNORMAL LOW
MCHC: 33.8
MCHC: 34.3
MCHC: 34.4
MCV: 88
MCV: 88.3
MCV: 88.9
Platelets: 85 — ABNORMAL LOW
RBC: 3.45 — ABNORMAL LOW
RBC: 3.48 — ABNORMAL LOW
RBC: 4.53
RDW: 13.9
WBC: 13.8 — ABNORMAL HIGH
WBC: 6.1
WBC: 6.3

## 2010-12-21 LAB — URINE CULTURE

## 2010-12-21 LAB — URINALYSIS, ROUTINE W REFLEX MICROSCOPIC
Leukocytes, UA: NEGATIVE
Nitrite: NEGATIVE
Specific Gravity, Urine: 1.01
Urobilinogen, UA: 0.2
pH: 5

## 2010-12-21 LAB — STREP A DNA PROBE: Group A Strep Probe: NEGATIVE

## 2010-12-21 LAB — CULTURE, BLOOD (ROUTINE X 2): Report Status: 7272009

## 2010-12-21 LAB — URINE MICROSCOPIC-ADD ON

## 2010-12-21 LAB — LACTIC ACID, PLASMA: Lactic Acid, Venous: 1.3

## 2010-12-21 MED ORDER — OXYCODONE-ACETAMINOPHEN 5-325 MG PO TABS
1.0000 | ORAL_TABLET | Freq: Once | ORAL | Status: AC
  Administered 2010-12-21: 1 via ORAL
  Filled 2010-12-21: qty 1

## 2010-12-21 MED ORDER — OXYCODONE-ACETAMINOPHEN 5-325 MG PO TABS: 1.0000 | ORAL_TABLET | ORAL | Status: AC | PRN

## 2010-12-21 NOTE — ED Provider Notes (Signed)
History     CSN: [id number] Arrival date & time: 12/21/2010  5:17 PM  Chief Complaint  Patient presents with  . Back Pain    (Consider location/radiation/quality/duration/timing/severity/associated sxs/prior treatment) HPI Comments: Patient with a history of chronic falls at home.  CNA at bedside and patient reports she has a distant history of fracture to her left lower extremity that has caused permanent pain and inability to completely extend the left knee.  She hops rather than standing on her left foot,  And will not touch her heel to floor,  Despite PT and encouragement to use walker by her pcp,  Dr Freida Busman.  Patient refused further pt and uses her walker only when away from home.  She fell today going down a 5 step flight in her home,  Landing on her buttocks.  She has increased low back pain since the event.  Patient is a 60 y.o. female presenting with back pain. The history is provided by the patient.  Back Pain  This is a recurrent problem. The current episode started 3 to 5 hours ago. The problem occurs constantly. The problem has not changed since onset.The pain is present in the lumbar spine. The quality of the pain is described as aching. The pain does not radiate. The pain is at a severity of 9/10. The pain is severe. The symptoms are aggravated by bending, twisting and certain positions. Pertinent negatives include no chest pain, no fever, no numbness, no headaches, no abdominal pain, no paresthesias and no weakness. Associated symptoms comments: Denies new leg pain.. She has tried nothing for the symptoms.    Past Medical History  Diagnosis Date  . Diabetes mellitus   . High cholesterol   . Hypertension   . Arthritis   . COPD (chronic obstructive pulmonary disease)     Past Surgical History  Procedure Date  . Knee surgery   . Cholecystectomy     History reviewed. No pertinent family history.  History  Substance Use Topics  . Smoking status: Former Research scientist (life sciences)  .  Smokeless tobacco: Not on file  . Alcohol Use: No    OB History    Grav Para Term Preterm Abortions TAB SAB Ect Mult Living                  Review of Systems  Constitutional: Negative for fever.  HENT: Negative for congestion, sore throat and neck pain.   Eyes: Negative.   Respiratory: Negative for chest tightness and shortness of breath.   Cardiovascular: Negative for chest pain.  Gastrointestinal: Negative for nausea and abdominal pain.  Genitourinary: Negative.   Musculoskeletal: Positive for back pain. Negative for joint swelling and arthralgias.  Skin: Negative.  Negative for rash and wound.  Neurological: Negative for dizziness, weakness, light-headedness, numbness, headaches and paresthesias.  Hematological: Negative.   Psychiatric/Behavioral: Negative.     Allergies  Fluoxetine hcl; Metoclopramide hcl; Rabeprazole sodium; and Robaxin  Home Medications   Current Outpatient Rx  Name Route Sig Dispense Refill  . IPRATROPIUM-ALBUTEROL 18-103 MCG/ACT IN AERO Inhalation Inhale 2 puffs into the lungs every 4 (four) hours as needed. For shortness of breath     . AMLODIPINE BESYLATE 10 MG PO TABS Oral Take 10 mg by mouth daily.      Marland Kitchen VITAMIN D 1000 UNITS PO TABS Oral Take 1,000 Units by mouth daily.      Marland Kitchen EZETIMIBE 10 MG PO TABS Oral Take 10 mg by mouth daily.      Marland Kitchen  FENOFIBRATE 160 MG PO TABS Oral Take 160 mg by mouth daily.      . FUROSEMIDE 20 MG PO TABS Oral Take 20 mg by mouth daily.      . GLYBURIDE-METFORMIN 2.5-500 MG PO TABS Oral Take 1 tablet by mouth 2 (two) times daily with a meal.      . METHOCARBAMOL 500 MG PO TABS  2 tabs po tid for spasm/pain 30 tablet 0  . OMEPRAZOLE 20 MG PO CPDR Oral Take 20 mg by mouth daily.      Marland Kitchen PAROXETINE HCL 20 MG PO TABS Oral Take 20 mg by mouth every morning.     Marland Kitchen POTASSIUM CHLORIDE 10 MEQ PO TBCR Oral Take 10 mEq by mouth daily.      . TRIAMCINOLONE ACETONIDE 0.1 % EX CREA Topical Apply 1 application topically 2 (two) times  daily.        BP 126/43  Pulse 66  Temp(Src) 97.9 F (36.6 C) (Oral)  Resp 14  Ht 4\' 10"  (1.473 m)  Wt 137 lb (62.143 kg)  BMI 28.63 kg/m2  SpO2 100%  Physical Exam  Constitutional: She is oriented to person, place, and time. She appears well-developed and well-nourished.  HENT:  Head: Normocephalic.  Eyes: Conjunctivae are normal.  Neck: Normal range of motion. Neck supple.  Cardiovascular: Regular rhythm and intact distal pulses.        Pedal pulses normal.  Pulmonary/Chest: Effort normal. She has no wheezes.  Abdominal: Soft. Bowel sounds are normal. She exhibits no distension and no mass.  Musculoskeletal: She exhibits no edema.       Lumbar back: She exhibits tenderness. She exhibits no swelling, no edema and no spasm.       Left knee held in chronic slight flexion.  Patient cannot fully extend this joint.  Neurological: She is alert and oriented to person, place, and time. She has normal strength. She displays no atrophy and no tremor. No cranial nerve deficit or sensory deficit. Gait normal.       No strength deficit noted in hip and knee flexor and extensor muscle groups.  Ankle flexion and extension intact.   Exam limited by chronic left knee contracture.  Skin: Skin is warm and dry.  Psychiatric: She has a normal mood and affect.    ED Course  Procedures (including critical care time)       MDM  Lumbar strain and contusion.  Patients labs and/or radiological studies were reviewed during the medical decision making and disposition process.   Dg Lumbar Spine Complete  12/21/2010  *RADIOLOGY REPORT*  Clinical Data: fall, lower back pain  LUMBAR SPINE - COMPLETE 4+ VIEW  Comparison: 11/09/2010  Findings: Five views of the lumbar spine submitted.  No acute fracture or subluxation.  Mild dextroscoliosis again noted.  Stable mild degenerative changes.  Post cholecystectomy surgical clips are noted.  Calcified uterine fibroids within pelvis again noted.  IMPRESSION: No  acute fracture or subluxation.  Stable degenerative changes.  Original Report Authenticated By: Lahoma Crocker, M.D.              [place]ulton Reek, [place] 12/21/10 1835

## 2010-12-21 NOTE — ED Notes (Signed)
Pt a/ox4. Resp even and unlabored. NAD at this time. D/C instructions and Rx reviewed with pt. Pt verbalized understanding. Pt to POV with friend to transport home.

## 2010-12-21 NOTE — ED Notes (Signed)
Pt c/o pain to lower back pt states she fell early this am

## 2010-12-21 NOTE — ED Provider Notes (Signed)
Medical screening examination/treatment/procedure(s) were performed by non-physician practitioner and as supervising physician I was immediately available for consultation/collaboration.   Trisha Mangle, MD 12/21/10 [phone]

## 2011-01-09 LAB — CBC
MCHC: 35.8
MCV: 87.3
RBC: 4.01
RDW: 13.9

## 2011-01-09 LAB — URINALYSIS, ROUTINE W REFLEX MICROSCOPIC
Nitrite: NEGATIVE
Specific Gravity, Urine: 1.02
pH: 6

## 2011-01-09 LAB — DIFFERENTIAL
Lymphocytes Relative: 30
Lymphs Abs: 1.9
Monocytes Relative: 10
Neutrophils Relative %: 58

## 2011-01-09 LAB — COMPREHENSIVE METABOLIC PANEL
AST: 40 — ABNORMAL HIGH
CO2: 30
Calcium: 9.6
Creatinine, Ser: 1.31 — ABNORMAL HIGH
GFR calc Af Amer: 51 — ABNORMAL LOW
GFR calc non Af Amer: 42 — ABNORMAL LOW
Glucose, Bld: 239 — ABNORMAL HIGH
Total Protein: 6.8

## 2011-01-09 LAB — LIPASE, BLOOD: Lipase: 23

## 2011-01-28 ENCOUNTER — Other Ambulatory Visit: Payer: Self-pay

## 2011-01-28 ENCOUNTER — Emergency Department (HOSPITAL_COMMUNITY): Payer: Medicare Other

## 2011-01-28 ENCOUNTER — Emergency Department (HOSPITAL_COMMUNITY)
Admission: EM | Admit: 2011-01-28 | Discharge: 2011-01-28 | Disposition: A | Discharge status: Home / Self Care | Payer: Medicare Other | Attending: Emergency Medicine | Admitting: Emergency Medicine

## 2011-01-28 ENCOUNTER — Encounter (HOSPITAL_COMMUNITY): Payer: Self-pay | Admitting: *Deleted

## 2011-01-28 DIAGNOSIS — Z87891 Personal history of nicotine dependence: Secondary | ICD-10-CM | POA: Insufficient documentation

## 2011-01-28 DIAGNOSIS — M129 Arthropathy, unspecified: Secondary | ICD-10-CM | POA: Insufficient documentation

## 2011-01-28 DIAGNOSIS — M25519 Pain in unspecified shoulder: Secondary | ICD-10-CM | POA: Insufficient documentation

## 2011-01-28 DIAGNOSIS — E7800 Pure hypercholesterolemia, unspecified: Secondary | ICD-10-CM | POA: Insufficient documentation

## 2011-01-28 DIAGNOSIS — J449 Chronic obstructive pulmonary disease, unspecified: Secondary | ICD-10-CM | POA: Insufficient documentation

## 2011-01-28 DIAGNOSIS — I10 Essential (primary) hypertension: Secondary | ICD-10-CM | POA: Insufficient documentation

## 2011-01-28 DIAGNOSIS — Z79899 Other long term (current) drug therapy: Secondary | ICD-10-CM | POA: Insufficient documentation

## 2011-01-28 DIAGNOSIS — J4489 Other specified chronic obstructive pulmonary disease: Secondary | ICD-10-CM | POA: Insufficient documentation

## 2011-01-28 DIAGNOSIS — E119 Type 2 diabetes mellitus without complications: Secondary | ICD-10-CM | POA: Insufficient documentation

## 2011-01-28 MED ORDER — NAPROXEN 500 MG PO TABS: 500.0000 mg | ORAL_TABLET | Freq: Two times a day (BID) | ORAL | Status: DC

## 2011-01-28 NOTE — ED Provider Notes (Signed)
History     CSN: [id number] Arrival date & time: 01/28/2011  3:21 PM   First MD Initiated Contact with Patient 01/28/11 1609      Chief Complaint  Patient presents with  . Arm Pain    left     (Consider location/radiation/quality/duration/timing/severity/associated sxs/prior treatment) Patient is a 60 y.o. female presenting with arm pain. The history is provided by the patient.  Arm Pain This is a new problem. The current episode started in the past 7 days. The problem occurs constantly. The problem has been unchanged. Associated symptoms include arthralgias. Pertinent negatives include no abdominal pain, anorexia, change in bowel habit, chest pain, chills, congestion, coughing, diaphoresis, fatigue, fever, headaches, joint swelling, myalgias, nausea, neck pain, numbness, rash, sore throat, vomiting or weakness. Exacerbated by: movement and palpation. She has tried nothing for the symptoms. The treatment provided no relief.    Past Medical History  Diagnosis Date  . Diabetes mellitus   . High cholesterol   . Hypertension   . Arthritis   . COPD (chronic obstructive pulmonary disease)     Past Surgical History  Procedure Date  . Knee surgery   . Cholecystectomy     History reviewed. No pertinent family history.  History  Substance Use Topics  . Smoking status: Former Research scientist (life sciences)  . Smokeless tobacco: Not on file  . Alcohol Use: No    OB History    Grav Para Term Preterm Abortions TAB SAB Ect Mult Living                  Review of Systems  Constitutional: Negative for fever, chills, diaphoresis and fatigue.  HENT: Negative for congestion, sore throat, facial swelling, trouble swallowing, neck pain and neck stiffness.   Respiratory: Negative for cough, chest tightness, shortness of breath and wheezing.   Cardiovascular: Negative for chest pain and palpitations.  Gastrointestinal: Negative for nausea, vomiting, abdominal pain, anorexia and change in bowel habit.    Genitourinary: Negative for dysuria, hematuria and flank pain.  Musculoskeletal: Positive for arthralgias. Negative for myalgias, back pain, joint swelling and gait problem.  Skin: Negative.  Negative for rash.  Neurological: Negative for dizziness, weakness, numbness and headaches.  Hematological: Negative for adenopathy. Does not bruise/bleed easily.  Psychiatric/Behavioral: Negative for confusion and decreased concentration.  All other systems reviewed and are negative.    Allergies  Fluoxetine hcl; Metoclopramide hcl; Rabeprazole sodium; and Robaxin  Home Medications   Current Outpatient Rx  Name Route Sig Dispense Refill  . IPRATROPIUM-ALBUTEROL 18-103 MCG/ACT IN AERO Inhalation Inhale 2 puffs into the lungs every 4 (four) hours as needed. For shortness of breath     . AMLODIPINE BESYLATE 10 MG PO TABS Oral Take 10 mg by mouth daily.      Marland Kitchen VITAMIN D 1000 UNITS PO TABS Oral Take 1,000 Units by mouth daily.      Marland Kitchen EZETIMIBE 10 MG PO TABS Oral Take 10 mg by mouth daily.      . FENOFIBRATE 160 MG PO TABS Oral Take 160 mg by mouth daily.      . FUROSEMIDE 20 MG PO TABS Oral Take 20 mg by mouth daily.      . GLYBURIDE-METFORMIN 2.5-500 MG PO TABS Oral Take 1 tablet by mouth 2 (two) times daily with a meal.      . METHOCARBAMOL 500 MG PO TABS  2 tabs po tid for spasm/pain 30 tablet 0  . NAPROXEN 500 MG PO TABS Oral Take 1 tablet (500  mg total) by mouth 2 (two) times daily. Prn pain 30 tablet 0  . OMEPRAZOLE 20 MG PO CPDR Oral Take 20 mg by mouth daily.      Marland Kitchen PAROXETINE HCL 20 MG PO TABS Oral Take 20 mg by mouth every morning.     Marland Kitchen POTASSIUM CHLORIDE 10 MEQ PO TBCR Oral Take 10 mEq by mouth daily.      . TRIAMCINOLONE ACETONIDE 0.1 % EX CREA Topical Apply 1 application topically 2 (two) times daily.        BP 142/75  Pulse 82  Temp(Src) 98.5 F (36.9 C) (Oral)  Resp 20  Ht 4\' 10"  (1.473 m)  Wt 140 lb (63.504 kg)  BMI 29.26 kg/m2  SpO2 96%  Physical Exam  Nursing note and  vitals reviewed. Constitutional: She is oriented to person, place, and time. She appears well-developed and well-nourished. No distress.  HENT:  Head: Normocephalic and atraumatic.  Mouth/Throat: Oropharynx is clear and moist.  Eyes: EOM are normal. Pupils are equal, round, and reactive to light.  Neck: Normal range of motion. Neck supple. No spinous process tenderness and no muscular tenderness present. No Brudzinski's sign and no Kernig's sign noted. No thyromegaly present.  Cardiovascular: Normal rate, regular rhythm and normal heart sounds.   Pulmonary/Chest: Effort normal and breath sounds normal. No respiratory distress. She exhibits no tenderness.  Musculoskeletal: She exhibits tenderness. She exhibits no edema.       Left shoulder: She exhibits decreased range of motion, tenderness and bony tenderness. She exhibits no swelling, no effusion, no crepitus, no deformity, no laceration, normal pulse and normal strength.       ttp of the anterior left shoulder joint.  No edema or erythema,  Pain also reproduced with abduction of the left arm.  Radial pulses are brisk, sensation intact distally.  CR<2 sec  Lymphadenopathy:    She has no cervical adenopathy.  Neurological: She is alert and oriented to person, place, and time. She has normal strength. No cranial nerve deficit or sensory deficit. She exhibits normal muscle tone. Coordination and gait normal.  Reflex Scores:      Tricep reflexes are 2+ on the right side and 2+ on the left side.      Bicep reflexes are 2+ on the right side and 2+ on the left side.      Brachioradialis reflexes are 2+ on the right side and 2+ on the left side. Skin: Skin is warm and dry.    ED Course  Procedures (including critical care time)  Dg Shoulder Left  01/28/2011  *RADIOLOGY REPORT*  Clinical Data: Shoulder pain for 3 months  LEFT SHOULDER - 2+ VIEW  Comparison: None  Findings: AC joint alignment normal. Mild diffuse osseous demineralization questioned.  No acute fracture, dislocation, or bone destruction. Visualized left ribs intact.  IMPRESSION: No acute bony abnormalities.  Original Report Authenticated By: Burnetta Sabin, M.D.     1. Shoulder pain       MDM     Date: 01/28/2011  Rate:92  Rhythm: normal sinus rhythm  QRS Axis: normal  Intervals: normal  ST/T Wave abnormalities: nonspecific T wave changes  Conduction Disutrbances:none  Narrative Interpretation:   Old EKG Reviewed: unchanged    6:17 PM patient has ttp of the left shoulder joint and pain also reproduced with abduction of the arm.  CR<2 sec, distal sensation intact.  Radial pulse is brisk.  Grip strength is strong and equal bilaterally.  No edema, erythema or  excessive warmth of the joint.  Patient agrees to f/u with orthopedics and requests to f/u with Dr. Aline Brochure.    Patient / Family / Caregiver understand and agree with initial ED impression and plan with expectations set for ED visit.       Yeva Bissette L. [place], [place] 01/28/11 1825

## 2011-01-28 NOTE — ED Provider Notes (Signed)
Medical screening examination/treatment/procedure(s) were performed by non-physician practitioner and as supervising physician I was immediately available for consultation/collaboration.   Sharyon Cable, MD 01/28/11 2011

## 2011-01-28 NOTE — ED Notes (Signed)
Discharge instructions reviewed with pt, questions answered. Pt verbalized understanding.  

## 2011-01-28 NOTE — ED Notes (Signed)
PA at bedside.

## 2011-01-28 NOTE — ED Notes (Signed)
Pt back from radiology 

## 2011-01-28 NOTE — ED Notes (Signed)
Pt c/o left arm pain x 4 days ago; pt denies any injury

## 2011-03-29 ENCOUNTER — Encounter (HOSPITAL_COMMUNITY): Payer: Self-pay | Admitting: *Deleted

## 2011-03-29 ENCOUNTER — Emergency Department (HOSPITAL_COMMUNITY)
Admission: EM | Admit: 2011-03-29 | Discharge: 2011-03-29 | Disposition: A | Discharge status: Home / Self Care | Payer: Medicare Other | Attending: Emergency Medicine | Admitting: Emergency Medicine

## 2011-03-29 ENCOUNTER — Emergency Department (HOSPITAL_COMMUNITY): Payer: Medicare Other

## 2011-03-29 DIAGNOSIS — I10 Essential (primary) hypertension: Secondary | ICD-10-CM | POA: Insufficient documentation

## 2011-03-29 DIAGNOSIS — E669 Obesity, unspecified: Secondary | ICD-10-CM | POA: Insufficient documentation

## 2011-03-29 DIAGNOSIS — M545 Low back pain, unspecified: Secondary | ICD-10-CM | POA: Insufficient documentation

## 2011-03-29 DIAGNOSIS — E789 Disorder of lipoprotein metabolism, unspecified: Secondary | ICD-10-CM | POA: Insufficient documentation

## 2011-03-29 DIAGNOSIS — Z79899 Other long term (current) drug therapy: Secondary | ICD-10-CM | POA: Insufficient documentation

## 2011-03-29 DIAGNOSIS — J4489 Other specified chronic obstructive pulmonary disease: Secondary | ICD-10-CM | POA: Insufficient documentation

## 2011-03-29 DIAGNOSIS — J449 Chronic obstructive pulmonary disease, unspecified: Secondary | ICD-10-CM | POA: Insufficient documentation

## 2011-03-29 DIAGNOSIS — E119 Type 2 diabetes mellitus without complications: Secondary | ICD-10-CM | POA: Insufficient documentation

## 2011-03-29 DIAGNOSIS — M129 Arthropathy, unspecified: Secondary | ICD-10-CM | POA: Insufficient documentation

## 2011-03-29 MED ORDER — IBUPROFEN 800 MG PO TABS
800.0000 mg | ORAL_TABLET | Freq: Once | ORAL | Status: AC
  Administered 2011-03-29: 800 mg via ORAL
  Filled 2011-03-29: qty 1

## 2011-03-29 MED ORDER — TRAMADOL HCL 50 MG PO TABS
50.0000 mg | ORAL_TABLET | Freq: Once | ORAL | Status: DC
  Filled 2011-03-29: qty 1

## 2011-03-29 MED ORDER — HYDROCODONE-ACETAMINOPHEN 5-325 MG PO TABS: 1.0000 | ORAL_TABLET | Freq: Four times a day (QID) | ORAL | Status: AC | PRN

## 2011-03-29 NOTE — ED Notes (Signed)
Pain is worse with movement. Denies urinary symptoms.

## 2011-03-29 NOTE — ED Notes (Signed)
Lower back pain x 2 days. No known injury.

## 2011-03-29 NOTE — ED Notes (Signed)
CBG was 109 when checked at home.

## 2011-03-29 NOTE — ED Provider Notes (Signed)
History     CSN: [id number]  Arrival date & time 03/29/11  1646   First MD Initiated Contact with Patient 03/29/11 1705      Chief Complaint  Patient presents with  . Back Pain    (Consider location/radiation/quality/duration/timing/severity/associated sxs/prior treatment) HPI Comments: Pt had a L  TKR ~ 1 year ago.  She has been in therapy twice to improve her gait(she hops when she walks).  She has fallen several times since surgery and ~ 3 times in the past 6 weeks.  Patient is a 61 y.o. female presenting with back pain. The history is provided by the patient and a friend. No language interpreter was used.  Back Pain  This is a recurrent problem. The problem occurs constantly. The problem has not changed since onset.The pain is associated with falling. The pain is present in the lumbar spine. The quality of the pain is described as aching. The pain does not radiate. The pain is moderate. The pain is the same all the time. Pertinent negatives include no fever. She has tried NSAIDs for the symptoms. The treatment provided mild relief. Risk factors include obesity and lack of exercise.    Past Medical History  Diagnosis Date  . Diabetes mellitus   . High cholesterol   . Hypertension   . Arthritis   . COPD (chronic obstructive pulmonary disease)     Past Surgical History  Procedure Date  . Knee surgery   . Cholecystectomy     No family history on file.  History  Substance Use Topics  . Smoking status: Former Research scientist (life sciences)  . Smokeless tobacco: Not on file  . Alcohol Use: No    OB History    Grav Para Term Preterm Abortions TAB SAB Ect Mult Living                  Review of Systems  Constitutional: Negative for fever and chills.  Musculoskeletal: Positive for back pain.  All other systems reviewed and are negative.    Allergies  Fluoxetine hcl; Metoclopramide hcl; Rabeprazole sodium; and Robaxin  Home Medications   Current Outpatient Rx  Name Route Sig Dispense  Refill  . IPRATROPIUM-ALBUTEROL 18-103 MCG/ACT IN AERO Inhalation Inhale 2 puffs into the lungs every 4 (four) hours as needed. For shortness of breath     . AMLODIPINE BESYLATE 10 MG PO TABS Oral Take 10 mg by mouth daily.      Marland Kitchen VITAMIN D 1000 UNITS PO TABS Oral Take 1,000 Units by mouth daily.      Marland Kitchen EZETIMIBE 10 MG PO TABS Oral Take 10 mg by mouth daily.      . FENOFIBRATE 160 MG PO TABS Oral Take 160 mg by mouth daily.      . FUROSEMIDE 20 MG PO TABS Oral Take 20 mg by mouth daily.      . GLYBURIDE-METFORMIN 2.5-500 MG PO TABS Oral Take 1 tablet by mouth 2 (two) times daily with a meal.      . OMEPRAZOLE 20 MG PO CPDR Oral Take 20 mg by mouth daily.      Marland Kitchen PAROXETINE HCL 20 MG PO TABS Oral Take 20 mg by mouth every morning.     Marland Kitchen POTASSIUM CHLORIDE 10 MEQ PO TBCR Oral Take 10 mEq by mouth daily.      . TRIAMCINOLONE ACETONIDE 0.1 % EX CREA Topical Apply 1 application topically 2 (two) times daily.        BP 125/59  Pulse  70  Temp(Src) 98.9 F (37.2 C) (Oral)  Resp 20  Ht 4\' 10"  (1.473 m)  Wt 138 lb (62.596 kg)  BMI 28.84 kg/m2  SpO2 100%  Physical Exam  Nursing note and vitals reviewed. Constitutional: She is oriented to person, place, and time. She appears well-developed and well-nourished. No distress.  HENT:  Head: Normocephalic and atraumatic.  Eyes: EOM are normal.  Neck: Normal range of motion.  Cardiovascular: Normal rate, regular rhythm and normal heart sounds.   Pulmonary/Chest: Effort normal and breath sounds normal.  Abdominal: Soft. She exhibits no distension. There is no tenderness.  Musculoskeletal:       Lumbar back: She exhibits decreased range of motion, tenderness, bony tenderness and pain. She exhibits no swelling, no edema, no deformity, no laceration, no spasm and normal pulse.       Back:  Neurological: She is alert and oriented to person, place, and time.  Skin: Skin is warm and dry.  Psychiatric: She has a normal mood and affect. Judgment normal.      ED Course  Procedures (including critical care time)  Labs Reviewed  GLUCOSE, CAPILLARY - Abnormal; Notable for the following:    Glucose-Capillary 133 (*)    All other components within normal limits   No results found.   No diagnosis found.    [place], [place] 03/29/11 [phone]

## 2011-03-29 NOTE — ED Provider Notes (Signed)
Medical screening examination/treatment/procedure(s) were performed by non-physician practitioner and as supervising physician I was immediately available for consultation/collaboration.  Kristen Jones. Olin Hauser, MD 03/29/11 [phone]

## 2011-05-27 ENCOUNTER — Encounter (HOSPITAL_COMMUNITY): Payer: Self-pay | Admitting: Pharmacy Technician

## 2011-05-27 ENCOUNTER — Other Ambulatory Visit: Payer: Self-pay | Admitting: Family Medicine

## 2011-06-03 NOTE — Pre-Procedure Instructions (Signed)
[place]  06/03/2011   Your procedure is scheduled on: June 10, 2011 at 0830 am.  Report to [facility] at [place] AM.  Call this number if you have problems the morning of surgery: [phone]   Remember:   Do not eat food:After Midnight.  May have clear liquids: up to 4 Hours before arrival until 0230 am.  Clear liquids include soda, tea, black coffee, apple or grape juice, broth.  Take these medicines the morning of surgery with A SIP OF WATER: Amlodipine, Combivent (inhaler), Omeprazole and Paroxetine    Do not wear jewelry, make-up or nail polish.  Do not wear lotions, powders, or perfumes. You may wear deodorant.  Do not shave 48 hours prior to surgery.  Do not bring valuables to the hospital.  Contacts, dentures or bridgework may not be worn into surgery.  Leave suitcase in the car. After surgery it may be brought to your room.  For patients admitted to the hospital, checkout time is 11:00 AM the day of discharge.   Patients discharged the day of surgery will not be allowed to drive home.  Name and phone number of your driver:   Special Instructions: CHG Shower Use Special Wash: 1/2 bottle night before surgery and 1/2 bottle morning of surgery.   Please read over the following fact sheets that you were given: Pain Booklet, Coughing and Deep Breathing and Surgical Site Infection Prevention

## 2011-06-04 ENCOUNTER — Inpatient Hospital Stay (HOSPITAL_COMMUNITY): Admission: RE | Admit: 2011-06-04 | Discharge: 2011-06-04 | Payer: Medicare Other | Source: Ambulatory Visit

## 2011-06-07 ENCOUNTER — Inpatient Hospital Stay (HOSPITAL_COMMUNITY): Admission: RE | Admit: 2011-06-07 | Discharge: 2011-06-07 | Payer: Medicare Other | Source: Ambulatory Visit

## 2011-06-07 ENCOUNTER — Encounter (HOSPITAL_COMMUNITY): Payer: Self-pay

## 2011-06-10 ENCOUNTER — Ambulatory Visit (HOSPITAL_COMMUNITY): Payer: Medicare Other

## 2011-06-10 ENCOUNTER — Encounter (HOSPITAL_COMMUNITY)
Admission: RE | Disposition: A | Discharge status: Home / Self Care | Payer: Self-pay | Source: Ambulatory Visit | Attending: Oral Surgery

## 2011-06-10 ENCOUNTER — Encounter (HOSPITAL_COMMUNITY): Payer: Self-pay | Admitting: *Deleted

## 2011-06-10 ENCOUNTER — Ambulatory Visit (HOSPITAL_COMMUNITY): Payer: Medicare Other | Admitting: *Deleted

## 2011-06-10 ENCOUNTER — Ambulatory Visit (HOSPITAL_COMMUNITY)
Admission: RE | Admit: 2011-06-10 | Discharge: 2011-06-10 | Disposition: A | Discharge status: Home / Self Care | Payer: Medicare Other | Source: Ambulatory Visit | Attending: Oral Surgery | Admitting: Oral Surgery

## 2011-06-10 DIAGNOSIS — E119 Type 2 diabetes mellitus without complications: Secondary | ICD-10-CM | POA: Insufficient documentation

## 2011-06-10 DIAGNOSIS — Z8679 Personal history of other diseases of the circulatory system: Secondary | ICD-10-CM

## 2011-06-10 DIAGNOSIS — K219 Gastro-esophageal reflux disease without esophagitis: Secondary | ICD-10-CM | POA: Insufficient documentation

## 2011-06-10 DIAGNOSIS — S93609A Unspecified sprain of unspecified foot, initial encounter: Secondary | ICD-10-CM

## 2011-06-10 DIAGNOSIS — Z9889 Other specified postprocedural states: Secondary | ICD-10-CM

## 2011-06-10 DIAGNOSIS — R109 Unspecified abdominal pain: Secondary | ICD-10-CM

## 2011-06-10 DIAGNOSIS — S93409A Sprain of unspecified ligament of unspecified ankle, initial encounter: Secondary | ICD-10-CM

## 2011-06-10 DIAGNOSIS — E785 Hyperlipidemia, unspecified: Secondary | ICD-10-CM

## 2011-06-10 DIAGNOSIS — K589 Irritable bowel syndrome without diarrhea: Secondary | ICD-10-CM

## 2011-06-10 DIAGNOSIS — R141 Gas pain: Secondary | ICD-10-CM

## 2011-06-10 DIAGNOSIS — M4120 Other idiopathic scoliosis, site unspecified: Secondary | ICD-10-CM

## 2011-06-10 DIAGNOSIS — K029 Dental caries, unspecified: Secondary | ICD-10-CM | POA: Insufficient documentation

## 2011-06-10 DIAGNOSIS — E7800 Pure hypercholesterolemia, unspecified: Secondary | ICD-10-CM

## 2011-06-10 DIAGNOSIS — J45909 Unspecified asthma, uncomplicated: Secondary | ICD-10-CM

## 2011-06-10 DIAGNOSIS — I10 Essential (primary) hypertension: Secondary | ICD-10-CM | POA: Insufficient documentation

## 2011-06-10 HISTORY — PX: MULTIPLE EXTRACTIONS WITH ALVEOLOPLASTY: SHX5342

## 2011-06-10 LAB — GLUCOSE, CAPILLARY: Glucose-Capillary: 143 mg/dL — ABNORMAL HIGH (ref 70–99)

## 2011-06-10 LAB — BASIC METABOLIC PANEL
BUN: 26 mg/dL — ABNORMAL HIGH (ref 6–23)
Chloride: 106 mEq/L (ref 96–112)
Creatinine, Ser: 0.9 mg/dL (ref 0.50–1.10)
GFR calc Af Amer: 80 mL/min — ABNORMAL LOW (ref >90)
GFR calc non Af Amer: 69 mL/min — ABNORMAL LOW (ref >90)
Glucose, Bld: 143 mg/dL — ABNORMAL HIGH (ref 70–99)
Potassium: 4.9 mEq/L (ref 3.5–5.1)

## 2011-06-10 LAB — CBC
HCT: 37.8 % (ref 36.0–46.0)
Hemoglobin: 12.7 g/dL (ref 12.0–15.0)
MCH: 29.9 pg (ref 26.0–34.0)
MCHC: 33.6 g/dL (ref 30.0–36.0)
RDW: 14.5 % (ref 11.5–15.5)

## 2011-06-10 SURGERY — MULTIPLE EXTRACTION WITH ALVEOLOPLASTY
Anesthesia: General | Site: Mouth | Laterality: Bilateral | Wound class: Clean Contaminated

## 2011-06-10 MED ORDER — OXYMETAZOLINE HCL 0.05 % NA SOLN
NASAL | Status: DC | PRN
  Administered 2011-06-10: 2 via NASAL

## 2011-06-10 MED ORDER — ONDANSETRON HCL 4 MG/2ML IJ SOLN
INTRAMUSCULAR | Status: DC | PRN
  Administered 2011-06-10: 4 mg via INTRAVENOUS

## 2011-06-10 MED ORDER — MIDAZOLAM HCL 5 MG/5ML IJ SOLN
INTRAMUSCULAR | Status: DC | PRN
  Administered 2011-06-10: 2 mg via INTRAVENOUS

## 2011-06-10 MED ORDER — FENTANYL CITRATE 0.05 MG/ML IJ SOLN
INTRAMUSCULAR | Status: DC | PRN
  Administered 2011-06-10: 50 ug via INTRAVENOUS

## 2011-06-10 MED ORDER — HYDROMORPHONE HCL PF 1 MG/ML IJ SOLN: 0.2500 mg | INTRAMUSCULAR | Status: DC | PRN

## 2011-06-10 MED ORDER — LACTATED RINGERS IV SOLN
INTRAVENOUS | Status: DC | PRN
  Administered 2011-06-10: 08:00:00 via INTRAVENOUS

## 2011-06-10 MED ORDER — CEFAZOLIN SODIUM 1-5 GM-% IV SOLN
INTRAVENOUS | Status: AC
  Filled 2011-06-10: qty 50

## 2011-06-10 MED ORDER — DROPERIDOL 2.5 MG/ML IJ SOLN: 0.6250 mg | INTRAMUSCULAR | Status: DC | PRN

## 2011-06-10 MED ORDER — OXYCODONE-ACETAMINOPHEN 5-325 MG PO TABS: 1.0000 | ORAL_TABLET | ORAL | Status: AC | PRN

## 2011-06-10 MED ORDER — PROPOFOL 10 MG/ML IV EMUL
INTRAVENOUS | Status: DC | PRN
  Administered 2011-06-10: 50 mg via INTRAVENOUS
  Administered 2011-06-10: 150 mg via INTRAVENOUS

## 2011-06-10 MED ORDER — CEFAZOLIN SODIUM 1-5 GM-% IV SOLN
INTRAVENOUS | Status: DC | PRN
  Administered 2011-06-10: 1 g via INTRAVENOUS

## 2011-06-10 MED ORDER — OXYMETAZOLINE HCL 0.05 % NA SOLN
1.0000 | NASAL | Status: AC
  Administered 2011-06-10 (×2): 2 via NASAL
  Filled 2011-06-10: qty 15

## 2011-06-10 MED ORDER — LIDOCAINE-EPINEPHRINE 2 %-1:100000 IJ SOLN
INTRAMUSCULAR | Status: DC | PRN
  Administered 2011-06-10: 17 mL

## 2011-06-10 MED ORDER — SODIUM CHLORIDE 0.9 % IR SOLN
Status: DC | PRN
  Administered 2011-06-10: 1000 mL

## 2011-06-10 SURGICAL SUPPLY — 28 items
BUR CROSS CUT FISSURE 1.6 (BURR) IMPLANT
BUR EGG ELITE 4.0 (BURR) ×2 IMPLANT
CANISTER SUCTION 2500CC (MISCELLANEOUS) ×2 IMPLANT
CLOTH BEACON ORANGE TIMEOUT ST (SAFETY) ×2 IMPLANT
COVER SURGICAL LIGHT HANDLE (MISCELLANEOUS) ×2 IMPLANT
CRADLE DONUT ADULT HEAD (MISCELLANEOUS) ×2 IMPLANT
DECANTER SPIKE VIAL GLASS SM (MISCELLANEOUS) ×2 IMPLANT
GAUZE PACKING FOLDED 2  STR (GAUZE/BANDAGES/DRESSINGS) ×1
GAUZE PACKING FOLDED 2 STR (GAUZE/BANDAGES/DRESSINGS) ×1 IMPLANT
GLOVE BIO SURGEON STRL SZ 6.5 (GLOVE) ×2 IMPLANT
GLOVE BIO SURGEON STRL SZ7 (GLOVE) IMPLANT
GLOVE BIO SURGEON STRL SZ7.5 (GLOVE) ×2 IMPLANT
GLOVE BIOGEL PI IND STRL 7.0 (GLOVE) ×1 IMPLANT
GLOVE BIOGEL PI INDICATOR 7.0 (GLOVE) ×1
GOWN STRL NON-REIN LRG LVL3 (GOWN DISPOSABLE) ×4 IMPLANT
GOWN STRL REIN XL XLG (GOWN DISPOSABLE) ×2 IMPLANT
KIT BASIN OR (CUSTOM PROCEDURE TRAY) ×2 IMPLANT
KIT ROOM TURNOVER OR (KITS) ×2 IMPLANT
NEEDLE 22X1 1/2 (OR ONLY) (NEEDLE) ×2 IMPLANT
NS IRRIG 1000ML POUR BTL (IV SOLUTION) ×2 IMPLANT
PAD ARMBOARD 7.5X6 YLW CONV (MISCELLANEOUS) ×4 IMPLANT
SUT CHROMIC 3 0 PS 2 (SUTURE) ×2 IMPLANT
SYR 50ML SLIP (SYRINGE) IMPLANT
TOWEL OR 17X26 10 PK STRL BLUE (TOWEL DISPOSABLE) ×2 IMPLANT
TRAY ENT MC OR (CUSTOM PROCEDURE TRAY) ×2 IMPLANT
TUBING IRRIGATION (MISCELLANEOUS) ×2 IMPLANT
WATER STERILE IRR 1000ML POUR (IV SOLUTION) IMPLANT
YANKAUER SUCT BULB TIP NO VENT (SUCTIONS) ×2 IMPLANT

## 2011-06-10 NOTE — Progress Notes (Signed)
Report given to stephanie rn as caregiver

## 2011-06-10 NOTE — Progress Notes (Signed)
Patient's belongings sent to holding with her. She stated she had $2 on her and did not wish for it to be locked in security. Informed patient that [facility] is not responsible for belongings.

## 2011-06-10 NOTE — H&P (Signed)
HISTORY AND PHYSICAL  Kristen Jones is a 61 y.o. female patient with CC: dental pain  No diagnosis found.  Past Medical History  Diagnosis Date  . Diabetes mellitus   . High cholesterol   . Hypertension   . Arthritis   . COPD (chronic obstructive pulmonary disease)     No current facility-administered medications for this encounter.   Facility-Administered Medications Ordered in Other Encounters  Medication Dose Route Frequency Provider Last Rate Last Dose  . lactated ringers infusion    Continuous PRN Lucious Groves, CRNA       Allergies  Allergen Reactions  . Fluoxetine Hcl     REACTION: UNKNOWN REACTION  . Metoclopramide Hcl     REACTION: UNKNOWN REACTION  . Motrin (Ibuprofen) Other (See Comments)    REACTION UNKNOWN  . Rabeprazole Sodium     REACTION: UNKNOWN REACTION  . Robaxin Nausea And Vomiting   Active Problems:  * No active hospital problems. *   Vitals: Blood pressure 134/62, pulse 56, temperature 98 F (36.7 C), temperature source Oral, resp. rate 18, SpO2 98.00%. Lab results: Results for orders placed during the hospital encounter of 06/10/11 (from the past 24 hour(s))  GLUCOSE, CAPILLARY     Status: Abnormal   Collection Time   06/10/11  6:53 AM      Component Value Range   Glucose-Capillary 143 (*) 70 - 99 (mg/dL)  CBC     Status: Normal   Collection Time   06/10/11  7:01 AM      Component Value Range   WBC 5.9  4.0 - 10.5 (K/uL)   RBC 4.25  3.87 - 5.11 (MIL/uL)   Hemoglobin 12.7  12.0 - 15.0 (g/dL)   HCT 37.8  36.0 - 46.0 (%)   MCV 88.9  78.0 - 100.0 (fL)   MCH 29.9  26.0 - 34.0 (pg)   MCHC 33.6  30.0 - 36.0 (g/dL)   RDW 14.5  11.5 - 15.5 (%)   Platelets 196  150 - 400 (K/uL)   Radiology Results: Dg Chest 2 View  06/10/2011  *RADIOLOGY REPORT*  Clinical Data: High blood pressure, diabetic and ex-smoker. Preoperative exam.  CHEST - 2 VIEW  Comparison: 12/01/2008.  Findings: Slight increased markings left lung base stable. Mediastinal  and cardiac silhouette stable with mild calcification and tortuosity of the aorta and slight indistinctness left heart border.  No infiltrate, congestive heart failure or pneumothorax.  Mild scoliosis.  IMPRESSION: No acute abnormality.  Original Report Authenticated By: Doug Sou, M.D.   General appearance: alert, cooperative and no distress Head: Normocephalic, without obvious abnormality, atraumatic Eyes: conjunctivae/corneas clear. PERRL, EOM's intact. Fundi benign. Ears: normal TM's and external ear canals both ears Nose: Nares normal. Septum midline. Mucosa normal. No drainage or sinus tenderness. Throat: dental caries periodontal disease Teeth #'s 1, 6, 8, 9, 11, 20, 21, 22, 23, 24, 25, 26, 27, 28, 29 Neck: no adenopathy, no carotid bruit, no JVD, supple, symmetrical, trachea midline and thyroid not enlarged, symmetric, no tenderness/mass/nodules Resp: clear to auscultation bilaterally Cardio: regular rate and rhythm, S1, S2 normal, no murmur, click, rub or gallop  Assessment: 61 YO WF DM, HTN, COPD, Arthritis with non -restorable teeth #'s 1, 6, 8, 9, 11, 20, 21, 22, 23, 24, 25, 26, 27, 28, 29  Plan: Dental extractions, alveoloplasty, general anesthesia. Day surgery.   Gae Bon 06/10/2011

## 2011-06-10 NOTE — Anesthesia Procedure Notes (Signed)
Procedure Name: Intubation Date/Time: 06/10/2011 9:17 AM Performed by: Claria Dice Pre-anesthesia Checklist: Patient identified, Emergency Drugs available, Suction available, Patient being monitored and Timeout performed Patient Re-evaluated:Patient Re-evaluated prior to inductionOxygen Delivery Method: Circle system utilized Preoxygenation: Pre-oxygenation with 100% oxygen Intubation Type: IV induction Ventilation: Mask ventilation without difficulty Laryngoscope Size: Mac and 3 Grade View: Grade I Nasal Tubes: Right and Nasal prep performed Tube size: 7.0 mm Number of attempts: 1 Airway Equipment and Method: Video-laryngoscopy Placement Confirmation: ETT inserted through vocal cords under direct vision,  positive ETCO2 and breath sounds checked- equal and bilateral Secured at: 28 cm Tube secured with: Tape Dental Injury: Teeth and Oropharynx as per pre-operative assessment

## 2011-06-10 NOTE — Transfer of Care (Signed)
Immediate Anesthesia Transfer of Care Note  Patient: Kristen Jones  Procedure(s) Performed: Procedure(s) (LRB): MULTIPLE EXTRACION WITH ALVEOLOPLASTY (Bilateral)  Patient Location: PACU  Anesthesia Type: General  Level of Consciousness: awake  Airway & Oxygen Therapy: Patient Spontanous Breathing and Patient connected to face mask oxygen  Post-op Assessment: Report given to PACU RN and Post -op Vital signs reviewed and stable  Post vital signs: Reviewed and stable  Complications: No apparent anesthesia complications

## 2011-06-10 NOTE — Preoperative (Signed)
Beta Blockers   Reason not to administer Beta Blockers:Not Applicable 

## 2011-06-10 NOTE — Anesthesia Postprocedure Evaluation (Signed)
Anesthesia Post Note  Patient: Kristen Jones  Procedure(s) Performed: Procedure(s) (LRB): MULTIPLE EXTRACION WITH ALVEOLOPLASTY (Bilateral)  Anesthesia type: general  Patient location: PACU  Post pain: Pain level controlled  Post assessment: Patient's Cardiovascular Status Stable  Last Vitals:  Filed Vitals:   06/10/11 1100  BP: 160/63  Pulse: 82  Temp:   Resp: 15    Post vital signs: Reviewed and stable  Level of consciousness: sedated  Complications: No apparent anesthesia complications

## 2011-06-10 NOTE — Op Note (Signed)
[name], [name]              ACCOUNT NO.:  [id number]  MEDICAL RECORD NO.:  [id number]  LOCATION:  [facility]                         FACILITY:  [facility]  PHYSICIAN:  Gae Bon, M.D.  DATE OF BIRTH:  11-21-1950  DATE OF PROCEDURE:  06/10/2011 DATE OF DISCHARGE:                              OPERATIVE REPORT   PREOPERATIVE DIAGNOSIS:  Nonrestorable teeth numbers 1, 6, 8, 9, 11, 20, 21, 22, 23, 24, 25, 26, 27, 28, 29.  POSTOPERATIVE DIAGNOSIS:  Nonrestorable teeth numbers 1, 6, 8, 9, 11, 20, 21, 22, 23, 24, 25, 26, 27, 28, 29.  PROCEDURES: 1. Removal of teeth numbers 1, 6, 8, 9, 11, 20, 21, 22, 23, 24, 25,     26, 27, 28, 29. 2. Alveoplasty, right and left mandible, right and left maxilla.  SURGEON:  Gae Bon, MD  ANESTHESIA:  General, nasal, Dr. Tobias Alexander attending.  INDICATIONS FOR PROCEDURE:  Kristen Jones is a 61 year old female with multiple nonrestorable teeth who is referred to me by her general dentist with past medical history significant for hypertension, GERD, diabetes.  Because of her medical history and the number of teeth to be removed, it was recommended that the patient be under general anesthesia with airway protection via intubation.  PROCEDURE:  The patient was taken to the operating room and placed on the table in supine position.  General anesthesia was administered intravenously and a nasal endotracheal tube was placed and secured.  The eyes were protected.  The patient was draped for the procedure.  A time- out was performed.  Posterior pharynx was suctioned and a throat pack was placed.  2% lidocaine with 1:100,000 epinephrine was infiltrated in an inferior alveolar block in the right and left mandible and buccal and palatal infiltration of the maxilla, a total of 16 mL was utilized. Bite block was placed on the right side of the mouth and a sweetheart retractor was used to retract the tongue, and a #15 blade was used to make a full-thickness incision  both buccally and lingually around teeth numbers 20, 21, 22, 23, 24, 25, 26 in the mandible.  The periosteum was reflected with a periosteal elevator and interproximal bone was removed with the Stryker handpiece under irrigation.  Then, the teeth were elevated with a 301 elevator and removed from the mouth with the Asch forceps.  Sockets were curetted.  The periosteum was further reflected and alveoplasty was performed using an egg-shaped bur and a Seldin retractor to retract the lingual periosteum and tissues.  Then, the bone file was used to further smooth the area.  Then, the area was irrigated and closed with 3-0 chromic in the maxilla.  A #15 blade was used to make a full-thickness incision around teeth numbers 1, 8, 9, and 6.  The periosteum was reflected and then the teeth were elevated with a 301 elevator and removed from the mouth with the upper universal forceps. The sockets were curetted and irrigated and then the periosteum was further reflected to expose the alveolar bone.  Alveoplasty was performed with the egg-shaped bur and the bone file, and the area was irrigated and sutured with 3-0 chromic.  The bite block and sweetheart  were replaced the other side of the mouth.  A #15 blade was used to make a full-thickness incision around tooth number 1 in the maxilla and around teeth numbers 27, 28, and 29 in the mandible.  The periosteum was reflected.  Interproximal bone was removed in the lower arch.  The teeth were elevated with a 301 elevator and the lower teeth were removed with the Asch forceps and the upper tooth was removed with the upper universal forceps.  The sockets were then curetted and then the periosteum was further reflected in the mandible on the buccal and lingual aspects.  The Seldin retractor was placed on the lingual aspect to protect the lingual periosteal tissues, and then an egg-shaped bur and bone file were used to perform the alveoplasty in the  right mandible.  Then, the area was irrigated and closed with 3-0 chromic. The oral cavity was then irrigated, suctioned, and inspected and found to have good contour hemostasis, and then the throat pack was removed. The patient was awakened and taken to the recovery room, breathing spontaneously in good condition.  ESTIMATED BLOOD LOSS:  Minimum.  COMPLICATIONS:  None.  SPECIMENS:  None.     Gae Bon, M.D.     SMJ/MEDQ  D:  06/10/2011  T:  06/10/2011  Job:  [id number]

## 2011-06-10 NOTE — Op Note (Signed)
06/10/2011  9:51 AM  PATIENT:  Metta Clines  61 y.o. female  PRE-OPERATIVE DIAGNOSIS:  NONRESTORABLE TEETH #'s 1, 6, 8, 9, 11, 20, 21, 22, 23, 24, 25, 26, 27, 28, 29  POST-OPERATIVE DIAGNOSIS:  SAME  PROCEDURE:  Procedure(s): MULTIPLE EXTRACTION Teeth #'s 1, 6, 8, 9, 11, 20, 21, 22, 23, 24, 25, 26, 27, 28, 29, ALVEOLOPLASTY Bilateral Mandible, maxilla  SURGEON:  Surgeon(s): Gae Bon, DDS  ANESTHESIA:   local and general  EBL:  minimal  DRAINS: none   LOCAL MEDICATIONS USED:  LIDOCAINE 16CC  SPECIMEN:  No Specimen  COUNTS:  YES  PLAN OF CARE: Discharge to home after PACU  PATIENT DISPOSITION:  PACU - hemodynamically stable.   PROCEDURE DETAILS: Dictation # [id number]  Gae Bon, DMD 06/10/2011 9:51 AM

## 2011-06-10 NOTE — Anesthesia Preprocedure Evaluation (Signed)
Anesthesia Evaluation  Patient identified by MRN, date of birth, ID band Patient awake    Reviewed: Allergy & Precautions, H&P , NPO status , Patient's Chart, lab work & pertinent test results  History of Anesthesia Complications Negative for: history of anesthetic complications  Airway Mallampati: II TM Distance: >3 FB Neck ROM: Full    Dental No notable dental hx. (+) Dental Advisory Given   Pulmonary asthma , COPD COPD inhaler, former smoker   Pulmonary exam normal       Cardiovascular hypertension, Pt. on medications     Neuro/Psych negative neurological ROS     GI/Hepatic Neg liver ROS, GERD-  Controlled,  Endo/Other  Diabetes mellitus-, Type 2, Oral Hypoglycemic Agents  Renal/GU negative Renal ROS     Musculoskeletal   Abdominal   Peds  Hematology   Anesthesia Other Findings   Reproductive/Obstetrics                           Anesthesia Physical Anesthesia Plan  ASA: III  Anesthesia Plan: General   Post-op Pain Management:    Induction: Intravenous  Airway Management Planned: Oral ETT  Additional Equipment:   Intra-op Plan:   Post-operative Plan: Extubation in OR  Informed Consent: I have reviewed the patients History and Physical, chart, labs and discussed the procedure including the risks, benefits and alternatives for the proposed anesthesia with the patient or authorized representative who has indicated his/her understanding and acceptance.   Dental advisory given  Plan Discussed with: CRNA, Anesthesiologist and Surgeon  Anesthesia Plan Comments:         Anesthesia Quick Evaluation

## 2011-06-10 NOTE — Progress Notes (Signed)
11;35     REPORT CALLED TO MARY COCHRAN WHO IS HER  CAREGIVER AT HOME... TRANSPORTATION IS WAITING ON HER NOW....... INSTRUCTIONS WITH  1 PRESCRIPTION GIVEN

## 2011-06-11 ENCOUNTER — Encounter (HOSPITAL_COMMUNITY): Payer: Self-pay | Admitting: Oral Surgery

## 2011-07-17 ENCOUNTER — Encounter (HOSPITAL_COMMUNITY): Payer: Self-pay | Admitting: Emergency Medicine

## 2011-07-17 ENCOUNTER — Emergency Department (HOSPITAL_COMMUNITY): Payer: Medicare Other

## 2011-07-17 ENCOUNTER — Emergency Department (HOSPITAL_COMMUNITY)
Admission: EM | Admit: 2011-07-17 | Discharge: 2011-07-17 | Disposition: A | Discharge status: Home / Self Care | Payer: Medicare Other | Attending: Emergency Medicine | Admitting: Emergency Medicine

## 2011-07-17 DIAGNOSIS — E7800 Pure hypercholesterolemia, unspecified: Secondary | ICD-10-CM | POA: Insufficient documentation

## 2011-07-17 DIAGNOSIS — E119 Type 2 diabetes mellitus without complications: Secondary | ICD-10-CM | POA: Insufficient documentation

## 2011-07-17 DIAGNOSIS — J4489 Other specified chronic obstructive pulmonary disease: Secondary | ICD-10-CM | POA: Insufficient documentation

## 2011-07-17 DIAGNOSIS — M25439 Effusion, unspecified wrist: Secondary | ICD-10-CM | POA: Insufficient documentation

## 2011-07-17 DIAGNOSIS — I10 Essential (primary) hypertension: Secondary | ICD-10-CM | POA: Insufficient documentation

## 2011-07-17 DIAGNOSIS — R059 Cough, unspecified: Secondary | ICD-10-CM | POA: Insufficient documentation

## 2011-07-17 DIAGNOSIS — J449 Chronic obstructive pulmonary disease, unspecified: Secondary | ICD-10-CM | POA: Insufficient documentation

## 2011-07-17 DIAGNOSIS — M79609 Pain in unspecified limb: Secondary | ICD-10-CM | POA: Insufficient documentation

## 2011-07-17 DIAGNOSIS — W010XXA Fall on same level from slipping, tripping and stumbling without subsequent striking against object, initial encounter: Secondary | ICD-10-CM | POA: Insufficient documentation

## 2011-07-17 DIAGNOSIS — S62309A Unspecified fracture of unspecified metacarpal bone, initial encounter for closed fracture: Secondary | ICD-10-CM

## 2011-07-17 DIAGNOSIS — M25539 Pain in unspecified wrist: Secondary | ICD-10-CM | POA: Insufficient documentation

## 2011-07-17 DIAGNOSIS — R05 Cough: Secondary | ICD-10-CM | POA: Insufficient documentation

## 2011-07-17 DIAGNOSIS — Z79899 Other long term (current) drug therapy: Secondary | ICD-10-CM | POA: Insufficient documentation

## 2011-07-17 MED ORDER — OXYCODONE-ACETAMINOPHEN 5-325 MG PO TABS
1.0000 | ORAL_TABLET | Freq: Once | ORAL | Status: AC
  Administered 2011-07-17: 1 via ORAL
  Filled 2011-07-17: qty 1

## 2011-07-17 MED ORDER — OXYCODONE-ACETAMINOPHEN 5-325 MG PO TABS: 1.0000 | ORAL_TABLET | ORAL | Status: DC | PRN

## 2011-07-17 NOTE — ED Provider Notes (Signed)
History     CSN: [id number]  Arrival date & time 07/17/11  [id number]   First MD Initiated Contact with Patient 07/17/11 0840      Chief Complaint  Patient presents with  . Hand Pain  . Wrist Pain    HPI Kristen Jones is a 61 y.o. female who presents to the ED for right wrist and hand pain. She states tripped and fell 2 days ago and landed on right hand. Thought it would get better but has continued to have swelling and pain. She describes the past as sharp constant. She rates the pain as 9/10. She has not taken anything for pain. She did apply ice and elevate without relief. The history was provided by the patient.  Past Medical History  Diagnosis Date  . Diabetes mellitus   . High cholesterol   . Hypertension   . Arthritis   . COPD (chronic obstructive pulmonary disease)     Past Surgical History  Procedure Date  . Knee surgery   . Cholecystectomy   . Multiple extractions with alveoloplasty 06/10/2011    Procedure: MULTIPLE EXTRACION WITH ALVEOLOPLASTY;  Surgeon: Gae Bon, DDS;  Location: [facility];  Service: Oral Surgery;  Laterality: Bilateral;  Extractions of one,six,eight,nine,eleven,twenty,twenty-one,twenty-two,twenty-four,twenty-five,twenty-six,twenty-seven,twenty-eight,twenty-nine    Family History  Problem Relation Age of Onset  . Anesthesia problems Neg Hx   . Hypotension Neg Hx   . Malignant hyperthermia Neg Hx   . Pseudochol deficiency Neg Hx     History  Substance Use Topics  . Smoking status: Former Research scientist (life sciences)  . Smokeless tobacco: Not on file  . Alcohol Use: No    OB History    Grav Para Term Preterm Abortions TAB SAB Ect Mult Living                  Review of Systems  Constitutional: Negative for fever and chills.  HENT: Negative.   Respiratory: Positive for cough.        Hx of asthma  Cardiovascular: Negative.   Gastrointestinal: Negative for nausea, vomiting and abdominal pain.  Genitourinary: Negative.   Musculoskeletal: Negative for back  pain.       Right wrist and hand pain  Neurological: Negative for dizziness and headaches.  Psychiatric/Behavioral: Negative for confusion. The patient is not nervous/anxious.     Allergies  Fluoxetine hcl; Metoclopramide hcl; Motrin; Rabeprazole sodium; and Robaxin  Home Medications   Current Outpatient Rx  Name Route Sig Dispense Refill  . AMLODIPINE BESYLATE 10 MG PO TABS Oral Take 10 mg by mouth daily.     Marland Kitchen VITAMIN D 1000 UNITS PO TABS Oral Take 1,000 Units by mouth daily.     . COMBIVENT 18-103 MCG/ACT IN AERO  INHALE TWO PUFFS THREE TIMES A DAY. 14.7 each 2  . EZETIMIBE 10 MG PO TABS Oral Take 10 mg by mouth daily.     . FENOFIBRATE 160 MG PO TABS Oral Take 160 mg by mouth daily.     . FUROSEMIDE 20 MG PO TABS Oral Take 20 mg by mouth daily.     . GLYBURIDE-METFORMIN 2.5-500 MG PO TABS Oral Take 1 tablet by mouth 2 (two) times daily with a meal.     . OMEPRAZOLE 20 MG PO CPDR Oral Take 20 mg by mouth daily.     Marland Kitchen PAROXETINE HCL 20 MG PO TABS Oral Take 20 mg by mouth every morning.     Marland Kitchen POTASSIUM CHLORIDE 10 MEQ PO TBCR Oral Take 10 mEq  by mouth daily.     . TRIAMCINOLONE ACETONIDE 0.1 % EX CREA Topical Apply 1 application topically 2 (two) times daily.       BP 144/63  Pulse 73  Temp(Src) 98.3 F (36.8 C) (Oral)  Resp 21  Ht 4\' 10"  (1.473 m)  Wt 150 lb (68.04 kg)  BMI 31.35 kg/m2  SpO2 94%  Physical Exam  Constitutional: She is oriented to person, place, and time. She appears well-developed and well-nourished. No distress.  HENT:  Head: Normocephalic.  Eyes: EOM are normal.  Neck: Neck supple.  Cardiovascular: Normal rate.   Pulmonary/Chest: Effort normal.  Musculoskeletal:       Right wrist: She exhibits tenderness and swelling.       Right wrist tender ulna aspect with palpation and range of motion. Pain radiates to right hand and to forearm.  Neurological: She is alert and oriented to person, place, and time.       Radial pulses strong and equal. Adequate  circulation.  Skin: Skin is warm and dry.       Intact   Psychiatric: She has a normal mood and affect. Her behavior is normal. Judgment and thought content normal.    ED Course  Procedures Dg Wrist Complete Right  07/17/2011  *RADIOLOGY REPORT*  Clinical Data: Fall.  Pain.  RIGHT WRIST - COMPLETE 3+ VIEW  Comparison: Same day  Findings: No fracture of the wrist region.  There is slight widening of the distance between the navicular and the scaphoid suggesting scapholunate ligament tear.  There is a fracture of the base of the fifth metacarpal.  IMPRESSION: No fracture of the distal radius or ulna.  Possible scapholunate ligament tear, age indeterminate.  Fracture of the base of the fifth metacarpal.  Original Report Authenticated By: Jules Schick, M.D.   Dg Hand Complete Right  07/17/2011  *RADIOLOGY REPORT*  Clinical Data: Fall.  Pain.  RIGHT HAND - COMPLETE 3+ VIEW  Comparison: None.  Findings: There is no evidence for an acute fracture. No subluxation or dislocation.  Joint spaces are preserved.  No worrisome lytic or sclerotic osseous abnormality.  IMPRESSION: No evidence for fracture.  Original Report Authenticated By: ERIC A. MANSELL, M.D.   Assessment: Fracture right  5 th MC  Plan:  Ulnar gutter splint   Ice/elevate   Rx percocet   Ibuprofen   Follow up with Dr. Aline Brochure     I have reviewed this patient's vital signs, nurses notes, appropriate labs and imaging. I have discussed this patient and reviewed the x-rays with Dr. Thurnell Garbe.  [place], NP 07/17/11 [place], NP 07/17/11 [phone]

## 2011-07-17 NOTE — ED Provider Notes (Signed)
Medical screening examination/treatment/procedure(s) were performed by non-physician practitioner and as supervising physician I was immediately available for consultation/collaboration.   Alfonzo Feller, DO 07/17/11 2055

## 2011-07-17 NOTE — ED Notes (Addendum)
Pt states she tripped over her feet a couple of days ago and now c/o right hand/wrist pain.

## 2011-07-17 NOTE — Discharge Instructions (Signed)
Apply ice, elevate, take the pain medication as directed and follow up with Dr. Aline Brochure. Return here as needed.  Hand Fracture Your caregiver has diagnosed you with a fractured (broken) bone in your hand. If the bones are in good position and the hand is properly immobilized and rested, these injuries will usually heal in 3 to 6 weeks. A cast, splint, or bulky bandage is usually applied to keep the fracture site from moving. Do not remove the splint or cast until your caregiver approves. If the fracture is unstable or the bones are not aligned properly, surgery may be needed. Keep your hand raised (elevated) above the level of your heart as much as possible for the next 2 to 3 days until the swelling and pain are better. Apply ice packs for 15 to 20 minutes every 3 to 4 hours to help control the pain and swelling. See your caregiver or an orthopedic specialist as directed for follow-up care to make sure the fracture is beginning to heal properly. SEEK IMMEDIATE MEDICAL CARE IF:   You notice your fingers are cold, numb, crooked, or the pain of your injury is severe.   You are not improving or seem to be getting worse.   You have questions or concerns.  Document Released: 04/18/2004 Document Revised: 02/28/2011 Document Reviewed: 09/06/2008 [facility] Patient Information 2012 [place].Cast or Splint Care Casts and splints support injured limbs and keep bones from moving while they heal.  HOME CARE  Keep the cast or splint uncovered during the drying period.   A plaster cast can take 24 to 48 hours to dry.   A fiberglass cast will dry in less than 1 hour.   Do not rest the cast on anything harder than a pillow for 24 hours.   Do not put weight on your injured limb. Do not put pressure on the cast. Wait for your doctor's approval.   Keep the cast or splint dry.   Cover the cast or splint with a plastic bag during baths or wet weather.   If you have a cast over your chest and belly  (trunk), take sponge baths until the cast is taken off.   Keep your cast or splint clean. Wash a dirty cast with a damp cloth.   Do not put any objects under your cast or splint. Do not scratch the skin under the cast with an object.   Do not take out the padding from inside your cast.   Exercise your joints near the cast as told by your doctor.   Raise (elevate) your injured limb on 1 or 2 pillows for the first 1 to 3 days.  GET HELP RIGHT AWAY IF:  Your cast or splint cracks.   Your cast or splint is too tight or too loose.   You itch badly under the cast.   Your cast gets wet or has a soft spot.   You have a bad smell coming from the cast.   You get an object stuck under the cast.   Your skin around the cast becomes red or raw.   You have new or more pain after the cast is put on.   You have fluid leaking through the cast.   You cannot move your fingers or toes.   Your fingers or toes turn colors or are cool, painful, or puffy (swollen).   You have tingling or lose feeling (numbness) around the injured area.   You have pain or pressure under the  cast.   You have trouble breathing or have shortness of breath.   You have chest pain.  MAKE SURE YOU:  Understand these instructions.   Will watch your condition.   Will get help right away if you are not doing well or get worse.  Document Released: 07/11/2010 Document Revised: 02/28/2011 Document Reviewed: 07/11/2010 [facility] Patient Information 2012 [place].

## 2011-07-24 ENCOUNTER — Ambulatory Visit (INDEPENDENT_AMBULATORY_CARE_PROVIDER_SITE_OTHER): Payer: Medicare Other | Admitting: Orthopedic Surgery

## 2011-07-24 ENCOUNTER — Encounter: Payer: Self-pay | Admitting: Orthopedic Surgery

## 2011-07-24 VITALS — BP 108/50 | Ht <= 58 in | Wt 150.0 lb

## 2011-07-24 DIAGNOSIS — S62319A Displaced fracture of base of unspecified metacarpal bone, initial encounter for closed fracture: Secondary | ICD-10-CM

## 2011-07-24 MED ORDER — OXYCODONE-ACETAMINOPHEN 5-325 MG PO TABS: 1.0000 | ORAL_TABLET | ORAL | Status: AC | PRN

## 2011-07-24 NOTE — Patient Instructions (Signed)
Keep  Cast dry   Do not get wet   If it gets wet dry with a hair dryer on low setting and call the office   

## 2011-07-24 NOTE — Progress Notes (Signed)
  Subjective:    Kristen Jones is a 61 y.o. female who presents for evaluation of RIGHT hand injury.  The patient twisted and fell, injured her RIGHT hand 2 weeks ago. X-ray shows a metacarpal fracture at the base of the 5th metacarpal. She complains of sharp 9 and 10 constant pain with bruising, tingling, swelling, and pain with movement. Date of injury April 16.  The following portions of the patient's history were reviewed and updated as appropriate: allergies, current medications, past family history, past medical history, past social history, past surgical history and problem list.    System review includes weight gain, blurred vision, shortness of breath, wheezing diarrhea heartburn, stiffness, muscle pain, itching, redness, tingling, nervousness, easy bruising excessive thirst, and seasonal allergies  Review of Systems Pertinent items are noted in HPI.   Objective:    BP 122/72  Ht 4\' 11"  (1.499 m)  Wt 61.236 kg (135 lb)  BMI 27.27 kg/m2        Vital signs are stable as recorded  General appearance is normal  The patient is alert and oriented x3  The patient's mood and affect are normal  Gait assessment: normal  The cardiovascular exam reveals normal pulses and temperature without edema swelling.  The lymphatic system is negative for palpable lymph nodes  The sensory exam is normal.  There are no pathologic reflexes.  Balance is normal.   Exam of the right hand  There is tenderness over the site of the fracture. There is swelling there. The range of motion at the wrist and metacarpophalangeal joints is normal. No atrophy is noted. Skin is intact.  No joint instability is noted.   X-rays done at the hospital, shows a fractured the base of the 5th metacarpal. There is no dorsal displacement.  Recommended short arm cast for a month and an x-ray out of plaster

## 2011-08-05 ENCOUNTER — Telehealth: Payer: Self-pay | Admitting: Radiology

## 2011-08-05 NOTE — Telephone Encounter (Signed)
Patient needs refill on percocet

## 2011-08-06 MED ORDER — HYDROCODONE-ACETAMINOPHEN 5-500 MG PO TABS: 1.0000 | ORAL_TABLET | ORAL | Status: DC | PRN

## 2011-08-06 NOTE — Telephone Encounter (Signed)
Declined  But vicodin ok  1 q 4 prn pain 60 nr

## 2011-08-27 ENCOUNTER — Ambulatory Visit (INDEPENDENT_AMBULATORY_CARE_PROVIDER_SITE_OTHER): Payer: Medicare Other | Admitting: Orthopedic Surgery

## 2011-08-27 ENCOUNTER — Ambulatory Visit (INDEPENDENT_AMBULATORY_CARE_PROVIDER_SITE_OTHER): Payer: Medicare Other

## 2011-08-27 ENCOUNTER — Encounter: Payer: Self-pay | Admitting: Orthopedic Surgery

## 2011-08-27 VITALS — BP 118/60 | Ht <= 58 in | Wt 150.0 lb

## 2011-08-27 DIAGNOSIS — S6290XA Unspecified fracture of unspecified wrist and hand, initial encounter for closed fracture: Secondary | ICD-10-CM

## 2011-08-27 DIAGNOSIS — S62309A Unspecified fracture of unspecified metacarpal bone, initial encounter for closed fracture: Secondary | ICD-10-CM

## 2011-08-27 MED ORDER — HYDROCODONE-ACETAMINOPHEN 5-500 MG PO TABS: 1.0000 | ORAL_TABLET | ORAL | Status: AC | PRN

## 2011-08-27 NOTE — Progress Notes (Signed)
Patient ID: Kristen Jones, female   DOB: 1950-06-14, 62 y.o.   MRN: [id number] Chief Complaint  Patient presents with  . Follow-up    follow up recheck right hand fracture, DOI 07/17/11    Followup for right fifth metacarpal fracture x-rays out of plaster  X-rays show fracture healing  Clinically there is some stiffness but the alignment of the hand is normal there is mild tenderness near the fracture and some inconsequential  Recommend follow up as needed I did give her some pain medicine for the last time no more medications no followup appointments needed  Patient has some drug-seeking behavior.

## 2011-08-27 NOTE — Patient Instructions (Signed)
activities as tolerated 

## 2012-08-31 ENCOUNTER — Emergency Department (HOSPITAL_COMMUNITY)
Admission: EM | Admit: 2012-08-31 | Discharge: 2012-08-31 | Disposition: A | Discharge status: Home / Self Care | Payer: Medicare Other | Attending: Emergency Medicine | Admitting: Emergency Medicine

## 2012-08-31 ENCOUNTER — Encounter (HOSPITAL_COMMUNITY): Payer: Self-pay

## 2012-08-31 ENCOUNTER — Emergency Department (HOSPITAL_COMMUNITY): Payer: Medicare Other

## 2012-08-31 DIAGNOSIS — J4489 Other specified chronic obstructive pulmonary disease: Secondary | ICD-10-CM | POA: Insufficient documentation

## 2012-08-31 DIAGNOSIS — G8929 Other chronic pain: Secondary | ICD-10-CM

## 2012-08-31 DIAGNOSIS — Z9889 Other specified postprocedural states: Secondary | ICD-10-CM | POA: Insufficient documentation

## 2012-08-31 DIAGNOSIS — Z8639 Personal history of other endocrine, nutritional and metabolic disease: Secondary | ICD-10-CM | POA: Insufficient documentation

## 2012-08-31 DIAGNOSIS — Z8781 Personal history of (healed) traumatic fracture: Secondary | ICD-10-CM | POA: Insufficient documentation

## 2012-08-31 DIAGNOSIS — J449 Chronic obstructive pulmonary disease, unspecified: Secondary | ICD-10-CM | POA: Insufficient documentation

## 2012-08-31 DIAGNOSIS — I10 Essential (primary) hypertension: Secondary | ICD-10-CM | POA: Insufficient documentation

## 2012-08-31 DIAGNOSIS — S93409A Sprain of unspecified ligament of unspecified ankle, initial encounter: Secondary | ICD-10-CM | POA: Insufficient documentation

## 2012-08-31 DIAGNOSIS — M129 Arthropathy, unspecified: Secondary | ICD-10-CM | POA: Insufficient documentation

## 2012-08-31 DIAGNOSIS — Y9289 Other specified places as the place of occurrence of the external cause: Secondary | ICD-10-CM | POA: Insufficient documentation

## 2012-08-31 DIAGNOSIS — E119 Type 2 diabetes mellitus without complications: Secondary | ICD-10-CM | POA: Insufficient documentation

## 2012-08-31 DIAGNOSIS — Y9301 Activity, walking, marching and hiking: Secondary | ICD-10-CM | POA: Insufficient documentation

## 2012-08-31 DIAGNOSIS — S93402A Sprain of unspecified ligament of left ankle, initial encounter: Secondary | ICD-10-CM

## 2012-08-31 DIAGNOSIS — Z87891 Personal history of nicotine dependence: Secondary | ICD-10-CM | POA: Insufficient documentation

## 2012-08-31 DIAGNOSIS — Z862 Personal history of diseases of the blood and blood-forming organs and certain disorders involving the immune mechanism: Secondary | ICD-10-CM | POA: Insufficient documentation

## 2012-08-31 DIAGNOSIS — X500XXA Overexertion from strenuous movement or load, initial encounter: Secondary | ICD-10-CM | POA: Insufficient documentation

## 2012-08-31 MED ORDER — HYDROCODONE-ACETAMINOPHEN 5-325 MG PO TABS: ORAL_TABLET | ORAL | Status: DC

## 2012-08-31 NOTE — ED Notes (Signed)
Pt reports left knee gave out yesterday and she fell.   C/O pain to left knee and ankle.

## 2012-08-31 NOTE — ED Notes (Signed)
Pt says she was on the way to [facility] and her lt knee "gave way".  Pain to knee and lt ankle.  Has had surgery on lt knee in past.

## 2012-08-31 NOTE — ED Provider Notes (Signed)
Medical screening examination/treatment/procedure(s) were performed by non-physician practitioner and as supervising physician I was immediately available for consultation/collaboration.   Ezequiel Essex, MD 08/31/12 [phone]

## 2012-08-31 NOTE — ED Provider Notes (Signed)
History     CSN: [id number]  Arrival date & time 08/31/12  1109   First MD Initiated Contact with Patient 08/31/12 1205      Chief Complaint  Patient presents with  . Ankle Pain    (Consider location/radiation/quality/duration/timing/severity/associated sxs/prior treatment) HPI Comments: Kristen Jones is a 62 y.o. female who presents to the Emergency Department complaining of left knee and ankle pain. She states that her left knee gave out while walking which caused her to" twist my ankle".  Patient reports history of same with chronic left knee pain secondary to previous fractures and surgery to the knee which she states did not heal well.  Pain to the ankle is worse with weightbearing and improves with rest. She denies redness, increased swelling, calf pain or hip pain.  Patient is a 62 y.o. female presenting with ankle pain.  Ankle Pain Associated symptoms: no fever     Past Medical History  Diagnosis Date  . Diabetes mellitus   . High cholesterol   . Hypertension   . Arthritis   . COPD (chronic obstructive pulmonary disease)     Past Surgical History  Procedure Laterality Date  . Knee surgery    . Cholecystectomy    . Multiple extractions with alveoloplasty  06/10/2011    Procedure: MULTIPLE EXTRACION WITH ALVEOLOPLASTY;  Surgeon: Gae Bon, DDS;  Location: [facility];  Service: Oral Surgery;  Laterality: Bilateral;  Extractions of one,six,eight,nine,eleven,twenty,twenty-one,twenty-two,twenty-four,twenty-five,twenty-six,twenty-seven,twenty-eight,twenty-nine    Family History  Problem Relation Age of Onset  . Anesthesia problems Neg Hx   . Hypotension Neg Hx   . Malignant hyperthermia Neg Hx   . Pseudochol deficiency Neg Hx     History  Substance Use Topics  . Smoking status: Former Research scientist (life sciences)  . Smokeless tobacco: Not on file  . Alcohol Use: No    OB History   Grav Para Term Preterm Abortions TAB SAB Ect Mult Living                  Review of Systems   Constitutional: Negative for fever and chills.  Genitourinary: Negative for dysuria and difficulty urinating.  Musculoskeletal: Positive for joint swelling and arthralgias.  Skin: Negative for color change and wound.  All other systems reviewed and are negative.    Allergies  Fluoxetine hcl; Methocarbamol; Metoclopramide hcl; Motrin; and Rabeprazole sodium  Home Medications   Current Outpatient Rx  Name  Route  Sig  Dispense  Refill  . albuterol-ipratropium (COMBIVENT) 18-103 MCG/ACT inhaler   Inhalation   Inhale 2 puffs into the lungs every 6 (six) hours as needed for wheezing.         Marland Kitchen amLODipine (NORVASC) 10 MG tablet   Oral   Take 10 mg by mouth daily.          Marland Kitchen ezetimibe (ZETIA) 10 MG tablet   Oral   Take 10 mg by mouth daily.          . fenofibrate 160 MG tablet   Oral   Take 160 mg by mouth daily.          . furosemide (LASIX) 20 MG tablet   Oral   Take 20 mg by mouth daily.          Marland Kitchen glyBURIDE-metformin (GLUCOVANCE) 2.5-500 MG per tablet   Oral   Take 1 tablet by mouth 2 (two) times daily with a meal.          . omeprazole (PRILOSEC) 20 MG capsule  Oral   Take 20 mg by mouth daily.          Marland Kitchen PARoxetine (PAXIL) 20 MG tablet   Oral   Take 20 mg by mouth every morning.          . potassium chloride (KLOR-CON) 10 MEQ CR tablet   Oral   Take 10 mEq by mouth daily.          Marland Kitchen triamcinolone (KENALOG) 0.1 % cream   Topical   Apply 1 application topically 2 (two) times daily.            BP 174/68  Pulse 82  Temp(Src) 98.1 F (36.7 C) (Oral)  Resp 17  Ht 4\' 10"  (1.473 m)  Wt 115 lb (52.164 kg)  BMI 24.04 kg/m2  SpO2 100%  Physical Exam  Nursing note and vitals reviewed. Constitutional: She is oriented to person, place, and time. She appears well-developed and well-nourished. No distress.  HENT:  Head: Normocephalic and atraumatic.  Cardiovascular: Normal rate, regular rhythm, normal heart sounds and intact distal pulses.    No murmur heard. Pulmonary/Chest: Effort normal and breath sounds normal. No respiratory distress.  Musculoskeletal: She exhibits tenderness.  Left lateral ankle is ttp, no edema.  ROM is preserved.  DP pulse is brisk, sensation intact.  No erythema, abrasion, bruising or bony deformity.  The left knee is diffusely tender to palpation, multiple old surgical scars are present. Patient has limited flexion of the knee. No excessive warmth or erythema is present. No calf pain. Compartments are soft.  Neurological: She is alert and oriented to person, place, and time. She exhibits normal muscle tone. Coordination normal.  Skin: Skin is warm and dry.    ED Course  Procedures (including critical care time)  Labs Reviewed - No data to display Dg Ankle Complete Left  08/31/2012   *RADIOLOGY REPORT*  Clinical Data: Fall.  Left ankle pain.  LEFT ANKLE COMPLETE - 3+ VIEW  Comparison: 10/09/2008  Findings: Bony demineralization observed.  Vascular calcifications are present.  No fracture identified.  IMPRESSION:  1.  No acute bony findings. 2.  Bony demineralization. 3.  Vascular calcifications.   Original Report Authenticated By: Van Clines, M.D.   Dg Knee Complete 4 Views Left  08/31/2012   *RADIOLOGY REPORT*  Clinical Data: Fall.  Knee pain.  Prior knee surgery.  LEFT KNEE - COMPLETE 4+ VIEW  Comparison: 01/22/2010  Findings: Lateral plate screw fixator in the distal femur noted. Screw fixators are observed in the proximal tibia.  There appears to be bony union of prior fractures sites. Patella not well seen, believed to be absent.  Irregularities in the intercondylar notch and tibial plateau are believed to relate to old fractures.  Possible free osteochondral fragment in the intercondylar notch.  IMPRESSION:  1.  No acute fracture observed.  Extensive deformity related to old proximal tibial and distal femoral fractures.  Bony bridging of these remote fractures is currently present. 2.  Suspected  small free osteochondral fragment in the intercondylar notch region.   Original Report Authenticated By: Van Clines, M.D.     ASO splint applied, pain improved, remains neurovascularly intact.   MDM  X-ray finding discussed with patient.    Patient has chronic left knee pain secondary to multiple old fractures. Chest tenderness to palpation of the lateral left malleolus. No edema. DP pulse is brisk and symmetrical no proximal tenderness. Likely sprain. She agrees to elevate apply ice and close followup with Dr. Aline Brochure.  The  patient appears reasonably screened and/or stabilized for discharge and I doubt any other medical condition or other [facility] requiring further screening, evaluation, or treatment in the ED at this time prior to discharge.     Evaluna Utke L. Uilani Sanville, PA-C 08/31/12 1249  Maxfield Gildersleeve L. Orel Cooler, PA-C 08/31/12 1300

## 2012-09-05 ENCOUNTER — Emergency Department (HOSPITAL_COMMUNITY)
Admission: EM | Admit: 2012-09-05 | Discharge: 2012-09-05 | Disposition: A | Discharge status: Home / Self Care | Payer: Medicare Other | Attending: Emergency Medicine | Admitting: Emergency Medicine

## 2012-09-05 DIAGNOSIS — Z23 Encounter for immunization: Secondary | ICD-10-CM | POA: Insufficient documentation

## 2012-09-05 DIAGNOSIS — S0101XA Laceration without foreign body of scalp, initial encounter: Secondary | ICD-10-CM

## 2012-09-05 DIAGNOSIS — W1809XA Striking against other object with subsequent fall, initial encounter: Secondary | ICD-10-CM | POA: Insufficient documentation

## 2012-09-05 DIAGNOSIS — J449 Chronic obstructive pulmonary disease, unspecified: Secondary | ICD-10-CM | POA: Insufficient documentation

## 2012-09-05 DIAGNOSIS — S0100XA Unspecified open wound of scalp, initial encounter: Secondary | ICD-10-CM | POA: Insufficient documentation

## 2012-09-05 DIAGNOSIS — S0990XA Unspecified injury of head, initial encounter: Secondary | ICD-10-CM

## 2012-09-05 DIAGNOSIS — Y9389 Activity, other specified: Secondary | ICD-10-CM | POA: Insufficient documentation

## 2012-09-05 DIAGNOSIS — Z8739 Personal history of other diseases of the musculoskeletal system and connective tissue: Secondary | ICD-10-CM | POA: Insufficient documentation

## 2012-09-05 DIAGNOSIS — J4489 Other specified chronic obstructive pulmonary disease: Secondary | ICD-10-CM | POA: Insufficient documentation

## 2012-09-05 DIAGNOSIS — I10 Essential (primary) hypertension: Secondary | ICD-10-CM | POA: Insufficient documentation

## 2012-09-05 DIAGNOSIS — Y929 Unspecified place or not applicable: Secondary | ICD-10-CM | POA: Insufficient documentation

## 2012-09-05 DIAGNOSIS — E119 Type 2 diabetes mellitus without complications: Secondary | ICD-10-CM | POA: Insufficient documentation

## 2012-09-05 DIAGNOSIS — E7800 Pure hypercholesterolemia, unspecified: Secondary | ICD-10-CM | POA: Insufficient documentation

## 2012-09-05 DIAGNOSIS — Z87891 Personal history of nicotine dependence: Secondary | ICD-10-CM | POA: Insufficient documentation

## 2012-09-05 DIAGNOSIS — Z79899 Other long term (current) drug therapy: Secondary | ICD-10-CM | POA: Insufficient documentation

## 2012-09-05 MED ORDER — TETANUS-DIPHTH-ACELL PERTUSSIS 5-2.5-18.5 LF-MCG/0.5 IM SUSP
0.5000 mL | Freq: Once | INTRAMUSCULAR | Status: AC
  Administered 2012-09-05: 0.5 mL via INTRAMUSCULAR
  Filled 2012-09-05: qty 0.5

## 2012-09-05 MED ORDER — LIDOCAINE-EPINEPHRINE-TETRACAINE (LET) SOLUTION
3.0000 mL | Freq: Once | NASAL | Status: AC
  Administered 2012-09-05: 3 mL via TOPICAL
  Filled 2012-09-05: qty 3

## 2012-09-05 NOTE — ED Provider Notes (Signed)
History  This chart was scribed for Kristen Cable, MD by Frederich Balding, ED Scribe. This patient was seen in room APA17/APA17 and the patient's care was started at 9:36 AM.  CSN: [id number]  Arrival date & time 09/05/12  [id number]    Chief Complaint  Patient presents with  . Fall    Patient is a 62 y.o. female presenting with fall. The history is provided by the patient. No language interpreter was used.  Fall This is a new problem. The current episode started 1 to 2 hours ago. The problem occurs rarely. Pertinent negatives include no chest pain, no abdominal pain, no headaches and no shortness of breath. Nothing aggravates the symptoms. Nothing relieves the symptoms. She has tried nothing for the symptoms.    HPI Comments: Kristen Jones is a 62 y.o. female who presents to the Emergency Department complaining of a fall that happened earlier this moring. She states her left knee gave out and she fell backwards and hit the back of her head. She denies any other injury. She states her left knee is always "bad". Pt denies LOC, fever, neck pain, sore throat, visual disturbance, CP, cough, SOB, abdominal pain, nausea, emesis, diarrhea, urinary symptoms, back pain, HA, weakness, dizziness, numbness and rash as associated symptoms. Pt states she does not know when her last tetanus shot was. She states her sugars were 124 this morning.   Past Medical History  Diagnosis Date  . Diabetes mellitus   . High cholesterol   . Hypertension   . Arthritis   . COPD (chronic obstructive pulmonary disease)     Past Surgical History  Procedure Laterality Date  . Knee surgery    . Cholecystectomy    . Multiple extractions with alveoloplasty  06/10/2011    Procedure: MULTIPLE EXTRACION WITH ALVEOLOPLASTY;  Surgeon: Gae Bon, DDS;  Location: [place];  Service: Oral Surgery;  Laterality: Bilateral;  Extractions of  one,six,eight,nine,eleven,twenty,twenty-one,twenty-two,twenty-four,twenty-five,twenty-six,twenty-seven,twenty-eight,twenty-nine    Family History  Problem Relation Age of Onset  . Anesthesia problems Neg Hx   . Hypotension Neg Hx   . Malignant hyperthermia Neg Hx   . Pseudochol deficiency Neg Hx     History  Substance Use Topics  . Smoking status: Former Research scientist (life sciences)  . Smokeless tobacco: Not on file  . Alcohol Use: No    OB History   Grav Para Term Preterm Abortions TAB SAB Ect Mult Living                  Review of Systems  HENT: Negative for neck pain.   Respiratory: Negative for shortness of breath.   Cardiovascular: Negative for chest pain.  Gastrointestinal: Negative for nausea, vomiting and abdominal pain.  Musculoskeletal: Negative for back pain.  Skin: Positive for wound (posterior scalp).  Neurological: Negative for dizziness, weakness, numbness and headaches.  All other systems reviewed and are negative.    Allergies  Fluoxetine hcl; Methocarbamol; Metoclopramide hcl; Motrin; and Rabeprazole sodium  Home Medications   Current Outpatient Rx  Name  Route  Sig  Dispense  Refill  . albuterol-ipratropium (COMBIVENT) 18-103 MCG/ACT inhaler   Inhalation   Inhale 2 puffs into the lungs every 6 (six) hours as needed for wheezing.         Marland Kitchen amLODipine (NORVASC) 10 MG tablet   Oral   Take 10 mg by mouth daily.          Marland Kitchen ezetimibe (ZETIA) 10 MG tablet   Oral   Take 10 mg  by mouth daily.          . fenofibrate 160 MG tablet   Oral   Take 160 mg by mouth daily.          . furosemide (LASIX) 20 MG tablet   Oral   Take 20 mg by mouth daily.          Marland Kitchen glyBURIDE-metformin (GLUCOVANCE) 2.5-500 MG per tablet   Oral   Take 1 tablet by mouth 2 (two) times daily with a meal.          . HYDROcodone-acetaminophen (NORCO/VICODIN) 5-325 MG per tablet      Take one-two tabs po q 4-6 hrs prn pain   15 tablet   0   . omeprazole (PRILOSEC) 20 MG capsule    Oral   Take 20 mg by mouth daily.          Marland Kitchen PARoxetine (PAXIL) 20 MG tablet   Oral   Take 20 mg by mouth every morning.          . potassium chloride (KLOR-CON) 10 MEQ CR tablet   Oral   Take 10 mEq by mouth daily.          Marland Kitchen triamcinolone (KENALOG) 0.1 % cream   Topical   Apply 1 application topically 2 (two) times daily.            BP 181/117  Pulse 83  Temp(Src) 98.1 F (36.7 C)  Resp 20  Wt 115 lb (52.164 kg)  BMI 24.04 kg/m2  SpO2 98%  Physical Exam  CONSTITUTIONAL: Well developed/well nourished HEAD:Posterior scalp laceration but no crepitance or stepoffs.  No other injury noted EYES: EOMI/PERRL ENMT: Mucous membranes moist,No evidence of facial/nasal trauma NECK: supple no meningeal signs SPINE:entire spine nontender, No bruising/crepitance/stepoffs noted to spine CV: S1/S2 noted, no murmurs/rubs/gallops noted LUNGS: Lungs are clear to auscultation bilaterally, no apparent distress ABDOMEN: soft, nontender, no rebound or guarding NEURO: Pt is awake/alert, moves all extremitiesx4, pt is able to ambulate on her own EXTREMITIES: pulses normal, full ROM, mild tenderness to left knee All extremities/joints palpated/ranged and nontender SKIN: warm, color normal, small laceration to posterior scalp PSYCH: no abnormalities of mood noted   ED Course  Procedures  LACERATION REPAIR Performed by: Kristen Jones Consent: Verbal consent obtained. Risks and benefits: risks, benefits and alternatives were discussed Patient identity confirmed: provided demographic data Time out performed prior to procedure Prepped and Draped in normal sterile fashion Wound explored Laceration Location: scalp Laceration Length: 0.5cm No Foreign Bodies seen or palpated LET applied  Amount of cleaning: standard Skin closure: simple Number of sutures or staples: 1 staple Technique: staples Patient tolerance: Patient tolerated the procedure well with no immediate  complications.   DIAGNOSTIC STUDIES: Oxygen Saturation is 98% on RA, normal by my interpretation.    COORDINATION OF CARE: 9:50 AM-Discussed treatment plan with pt at bedside and pt agreed to plan.   Pt not on anticoagulants.  She has no HA or nauseas/vomiting.  No LOC reported Do not feel neuroimaging necessary No other traumatic injury Denies weakness/dizziness, reports left knee always "gives out" Discussed head injury precautions   MDM  Nursing notes including past medical history and social history reviewed and considered in documentation       I personally performed the services described in this documentation, which was scribed in my presence. The recorded information has been reviewed and is accurate. {Add scribe attestation statement  Kristen Cable, MD 09/05/12 1045

## 2012-09-05 NOTE — ED Notes (Addendum)
Pt states her left knee gave out on her and she fell backwards and hit the back of her head. Small knot and hematoma on the back of her head. Some blood noted to the back of head. Very small break in the skin on her head noted.

## 2012-09-07 ENCOUNTER — Ambulatory Visit (HOSPITAL_COMMUNITY)
Admission: RE | Admit: 2012-09-07 | Discharge: 2012-09-07 | Disposition: A | Discharge status: Home / Self Care | Payer: Medicare Other | Source: Ambulatory Visit | Attending: Family Medicine | Admitting: Family Medicine

## 2012-09-07 ENCOUNTER — Other Ambulatory Visit (HOSPITAL_COMMUNITY): Payer: Self-pay | Admitting: Family Medicine

## 2012-09-07 DIAGNOSIS — IMO0002 Reserved for concepts with insufficient information to code with codable children: Secondary | ICD-10-CM

## 2012-09-07 DIAGNOSIS — M5137 Other intervertebral disc degeneration, lumbosacral region: Secondary | ICD-10-CM | POA: Insufficient documentation

## 2012-09-07 DIAGNOSIS — M51379 Other intervertebral disc degeneration, lumbosacral region without mention of lumbar back pain or lower extremity pain: Secondary | ICD-10-CM | POA: Insufficient documentation

## 2012-09-07 DIAGNOSIS — M545 Low back pain, unspecified: Secondary | ICD-10-CM | POA: Insufficient documentation

## 2012-12-03 ENCOUNTER — Encounter (HOSPITAL_COMMUNITY): Payer: Self-pay

## 2012-12-03 ENCOUNTER — Emergency Department (HOSPITAL_COMMUNITY)
Admission: EM | Admit: 2012-12-03 | Discharge: 2012-12-03 | Disposition: A | Discharge status: Home / Self Care | Payer: Medicare Other | Attending: Emergency Medicine | Admitting: Emergency Medicine

## 2012-12-03 ENCOUNTER — Emergency Department (HOSPITAL_COMMUNITY): Payer: Medicare Other

## 2012-12-03 DIAGNOSIS — J449 Chronic obstructive pulmonary disease, unspecified: Secondary | ICD-10-CM | POA: Insufficient documentation

## 2012-12-03 DIAGNOSIS — Z87891 Personal history of nicotine dependence: Secondary | ICD-10-CM | POA: Insufficient documentation

## 2012-12-03 DIAGNOSIS — J4489 Other specified chronic obstructive pulmonary disease: Secondary | ICD-10-CM | POA: Insufficient documentation

## 2012-12-03 DIAGNOSIS — I10 Essential (primary) hypertension: Secondary | ICD-10-CM | POA: Insufficient documentation

## 2012-12-03 DIAGNOSIS — Z8739 Personal history of other diseases of the musculoskeletal system and connective tissue: Secondary | ICD-10-CM | POA: Insufficient documentation

## 2012-12-03 DIAGNOSIS — E119 Type 2 diabetes mellitus without complications: Secondary | ICD-10-CM | POA: Insufficient documentation

## 2012-12-03 DIAGNOSIS — E7800 Pure hypercholesterolemia, unspecified: Secondary | ICD-10-CM | POA: Insufficient documentation

## 2012-12-03 DIAGNOSIS — Z79899 Other long term (current) drug therapy: Secondary | ICD-10-CM | POA: Insufficient documentation

## 2012-12-03 DIAGNOSIS — J069 Acute upper respiratory infection, unspecified: Secondary | ICD-10-CM

## 2012-12-03 NOTE — ED Notes (Signed)
Pt c/o non-productive cough x1-2 weeks. Pt also reports night sweats. Pt denies chest pain, dizziness. Pt states she has been seen previously for same symptoms and was given abx but denies relief.

## 2012-12-03 NOTE — ED Notes (Signed)
Pt reports nonproductive cough x 1 week.  Denies any pain or SOB.  Reports coughs until she "gags."  Unknown if has had fever.

## 2012-12-03 NOTE — ED Provider Notes (Signed)
CSN: [id number]     Arrival date & time 12/03/12  1643 History   First MD Initiated Contact with Patient 12/03/12 1751     Chief Complaint  Patient presents with  . Cough   (Consider location/radiation/quality/duration/timing/severity/associated sxs/prior Treatment) HPI  A history of COPD presents today with two-week history of coughing. She has been seen during this time by her primary care physician and placed on 7 days of antibiotics which she states did not improve the cough. She has a nonproductive cough and is at her baseline dyspnea. She uses her inhaler one time per day. She is on oxygen at night then this is her baseline. She has not had fever or chills. Past Medical History  Diagnosis Date  . Diabetes mellitus   . High cholesterol   . Hypertension   . Arthritis   . COPD (chronic obstructive pulmonary disease)    Past Surgical History  Procedure Laterality Date  . Knee surgery    . Cholecystectomy    . Multiple extractions with alveoloplasty  06/10/2011    Procedure: MULTIPLE EXTRACION WITH ALVEOLOPLASTY;  Surgeon: Gae Bon, DDS;  Location: [place];  Service: Oral Surgery;  Laterality: Bilateral;  Extractions of one,six,eight,nine,eleven,twenty,twenty-one,twenty-two,twenty-four,twenty-five,twenty-six,twenty-seven,twenty-eight,twenty-nine   Family History  Problem Relation Age of Onset  . Anesthesia problems Neg Hx   . Hypotension Neg Hx   . Malignant hyperthermia Neg Hx   . Pseudochol deficiency Neg Hx    History  Substance Use Topics  . Smoking status: Former Research scientist (life sciences)  . Smokeless tobacco: Not on file  . Alcohol Use: No   OB History   Grav Para Term Preterm Abortions TAB SAB Ect Mult Living                 Review of Systems  All other systems reviewed and are negative.    Allergies  Fluoxetine hcl; Methocarbamol; Metoclopramide hcl; Motrin; and Rabeprazole sodium  Home Medications   Current Outpatient Rx  Name  Route  Sig  Dispense  Refill  .  albuterol-ipratropium (COMBIVENT) 18-103 MCG/ACT inhaler   Inhalation   Inhale 2 puffs into the lungs every 6 (six) hours as needed for wheezing.         . benazepril (LOTENSIN) 10 MG tablet   Oral   Take 10 mg by mouth daily.         Marland Kitchen ezetimibe (ZETIA) 10 MG tablet   Oral   Take 10 mg by mouth daily.          . fenofibrate 160 MG tablet   Oral   Take 160 mg by mouth daily.          . furosemide (LASIX) 20 MG tablet   Oral   Take 20 mg by mouth daily.          Marland Kitchen glucose blood test strip   Other   1 each by Other route as needed for other. Use as instructed         . glyBURIDE-metformin (GLUCOVANCE) 2.5-500 MG per tablet   Oral   Take 1 tablet by mouth 2 (two) times daily with a meal.          . omeprazole (PRILOSEC) 20 MG capsule   Oral   Take 20 mg by mouth daily.          Marland Kitchen oxyCODONE-acetaminophen (PERCOCET/ROXICET) 5-325 MG per tablet   Oral   Take 1 tablet by mouth every 6 (six) hours as needed for pain.          Marland Kitchen  PARoxetine (PAXIL) 20 MG tablet   Oral   Take 20 mg by mouth every morning.          . potassium chloride (K-DUR) 10 MEQ tablet   Oral   Take 1 tablet by mouth daily.         Marland Kitchen PROAIR HFA 108 (90 BASE) MCG/ACT inhaler   Inhalation   Inhale 2 puffs into the lungs every 6 (six) hours as needed.          BP 129/75  Pulse 75  Temp(Src) 98 F (36.7 C) (Oral)  Resp 20  Ht 4\' 10"  (1.473 m)  Wt 134 lb (60.782 kg)  BMI 28.01 kg/m2  SpO2 98% Physical Exam  Nursing note and vitals reviewed. Constitutional: She is oriented to person, place, and time. She appears well-developed and well-nourished.  HENT:  Head: Normocephalic and atraumatic.  Eyes: Conjunctivae and EOM are normal. Pupils are equal, round, and reactive to light.  Neck: Normal range of motion. Neck supple.  Cardiovascular: Normal rate, regular rhythm, normal heart sounds and intact distal pulses.   Pulmonary/Chest: Effort normal and breath sounds normal.   Abdominal: Soft. Bowel sounds are normal.  Musculoskeletal: Normal range of motion.  Neurological: She is alert and oriented to person, place, and time. She has normal reflexes.  Skin: Skin is warm and dry.  Psychiatric: She has a normal mood and affect. Her behavior is normal. Judgment and thought content normal.    ED Course  Procedures (including critical care time) Labs Review Labs Reviewed  GLUCOSE, CAPILLARY - Abnormal; Notable for the following:    Glucose-Capillary 59 (*)    All other components within normal limits   Imaging Review Dg Chest 2 View  12/03/2012   CLINICAL DATA:  Cough and congestion for 1 week, history diabetes, COPD, hypertension, former smoker  EXAM: CHEST  2 VIEW  COMPARISON:  06/10/2011  FINDINGS: Enlargement of cardiac silhouette.  Mediastinal contours and pulmonary vascularity normal.  Minimal peribronchial thickening.  No pulmonary infiltrate, pleural effusion or pneumothorax.  Osseous structures unremarkable.  IMPRESSION: Minimal bronchitic changes.  No acute abnormalities.   Electronically Signed   By: Lavonia Dana M.D.   On: 12/03/2012 17:46    MDM  No diagnosis found. Patient with clear lungs and normal vitals and normal sats.  I do not feel patient would benefit from further antibiotics or steroids.  Patient advised.     Shaune Pollack, MD 12/03/12 [phone]

## 2013-05-12 ENCOUNTER — Emergency Department (HOSPITAL_COMMUNITY): Payer: Medicare Other

## 2013-05-12 ENCOUNTER — Emergency Department (HOSPITAL_COMMUNITY)
Admission: EM | Admit: 2013-05-12 | Discharge: 2013-05-12 | Disposition: A | Discharge status: Home / Self Care | Payer: Medicare Other | Attending: Emergency Medicine | Admitting: Emergency Medicine

## 2013-05-12 ENCOUNTER — Encounter (HOSPITAL_COMMUNITY): Payer: Self-pay | Admitting: Emergency Medicine

## 2013-05-12 DIAGNOSIS — Y9301 Activity, walking, marching and hiking: Secondary | ICD-10-CM | POA: Insufficient documentation

## 2013-05-12 DIAGNOSIS — E119 Type 2 diabetes mellitus without complications: Secondary | ICD-10-CM | POA: Insufficient documentation

## 2013-05-12 DIAGNOSIS — IMO0002 Reserved for concepts with insufficient information to code with codable children: Secondary | ICD-10-CM | POA: Insufficient documentation

## 2013-05-12 DIAGNOSIS — S8000XA Contusion of unspecified knee, initial encounter: Secondary | ICD-10-CM | POA: Insufficient documentation

## 2013-05-12 DIAGNOSIS — Z79899 Other long term (current) drug therapy: Secondary | ICD-10-CM | POA: Insufficient documentation

## 2013-05-12 DIAGNOSIS — M129 Arthropathy, unspecified: Secondary | ICD-10-CM | POA: Insufficient documentation

## 2013-05-12 DIAGNOSIS — R296 Repeated falls: Secondary | ICD-10-CM | POA: Insufficient documentation

## 2013-05-12 DIAGNOSIS — Z87891 Personal history of nicotine dependence: Secondary | ICD-10-CM | POA: Insufficient documentation

## 2013-05-12 DIAGNOSIS — E7800 Pure hypercholesterolemia, unspecified: Secondary | ICD-10-CM | POA: Insufficient documentation

## 2013-05-12 DIAGNOSIS — Z9889 Other specified postprocedural states: Secondary | ICD-10-CM | POA: Insufficient documentation

## 2013-05-12 DIAGNOSIS — I10 Essential (primary) hypertension: Secondary | ICD-10-CM | POA: Insufficient documentation

## 2013-05-12 DIAGNOSIS — J441 Chronic obstructive pulmonary disease with (acute) exacerbation: Secondary | ICD-10-CM | POA: Insufficient documentation

## 2013-05-12 DIAGNOSIS — Y92009 Unspecified place in unspecified non-institutional (private) residence as the place of occurrence of the external cause: Secondary | ICD-10-CM | POA: Insufficient documentation

## 2013-05-12 MED ORDER — ACETAMINOPHEN 500 MG PO TABS
1000.0000 mg | ORAL_TABLET | Freq: Once | ORAL | Status: AC
  Administered 2013-05-12: 1000 mg via ORAL
  Filled 2013-05-12: qty 2

## 2013-05-12 MED ORDER — PREDNISONE 50 MG PO TABS
60.0000 mg | ORAL_TABLET | Freq: Once | ORAL | Status: AC
  Administered 2013-05-12: 60 mg via ORAL
  Filled 2013-05-12 (×2): qty 1

## 2013-05-12 NOTE — Discharge Instructions (Signed)
the x-rays of your pelvis are negative for fracture or any dislocation. The x-rays of your knee are negative for any fracture or dislocation. The surgical hardware is in place. Please see Dr. Lorriane Shire for additional evaluation and management. Contusion A contusion is a deep bruise. Contusions happen when an injury causes bleeding under the skin. Signs of bruising include pain, puffiness (swelling), and discolored skin. The contusion may turn blue, purple, or yellow. HOME CARE   Put ice on the injured area.  Put ice in a plastic bag.  Place a towel between your skin and the bag.  Leave the ice on for 15-20 minutes, 03-04 times a day.  Only take medicine as told by your doctor.  Rest the injured area.  If possible, raise (elevate) the injured area to lessen puffiness. GET HELP RIGHT AWAY IF:   You have more bruising or puffiness.  You have pain that is getting worse.  Your puffiness or pain is not helped by medicine. MAKE SURE YOU:   Understand these instructions.  Will watch your condition.  Will get help right away if you are not doing well or get worse. Document Released: 08/28/2007 Document Revised: 06/03/2011 Document Reviewed: 01/14/2011 [facility] Patient Information 2014 [place], [place].

## 2013-05-12 NOTE — ED Provider Notes (Signed)
CSN: [id number]     Arrival date & time 05/12/13  1554 History   First MD Initiated Contact with Patient 05/12/13 1601     Chief Complaint  Patient presents with  . Knee Pain     (Consider location/radiation/quality/duration/timing/severity/associated sxs/prior Treatment) Patient is a 63 y.o. female presenting with knee pain. The history is provided by the patient.  Knee Pain Location:  Knee Time since incident:  6 hours Injury: no   Knee location:  L knee Pain details:    Quality:  Aching   Radiates to:  Does not radiate   Severity:  Moderate   Duration:  6 hours   Timing:  Intermittent   Progression:  Worsening Chronicity: acute on chronic pain. Dislocation: no   Prior injury to area:  Yes Relieved by:  Nothing Worsened by:  Bearing weight Associated symptoms: back pain, decreased ROM and stiffness   Associated symptoms: no neck pain   Risk factors comment:  Previous surgery reported involving the same knee.   Past Medical History  Diagnosis Date  . Diabetes mellitus   . High cholesterol   . Hypertension   . Arthritis   . COPD (chronic obstructive pulmonary disease)    Past Surgical History  Procedure Laterality Date  . Knee surgery    . Cholecystectomy    . Multiple extractions with alveoloplasty  06/10/2011    Procedure: MULTIPLE EXTRACION WITH ALVEOLOPLASTY;  Surgeon: Gae Bon, DDS;  Location: [place];  Service: Oral Surgery;  Laterality: Bilateral;  Extractions of one,six,eight,nine,eleven,twenty,twenty-one,twenty-two,twenty-four,twenty-five,twenty-six,twenty-seven,twenty-eight,twenty-nine   Family History  Problem Relation Age of Onset  . Anesthesia problems Neg Hx   . Hypotension Neg Hx   . Malignant hyperthermia Neg Hx   . Pseudochol deficiency Neg Hx    History  Substance Use Topics  . Smoking status: Former Research scientist (life sciences)  . Smokeless tobacco: Not on file  . Alcohol Use: No   OB History   Grav Para Term Preterm Abortions TAB SAB Ect Mult Living               Review of Systems  Constitutional: Negative for activity change.       All ROS Neg except as noted in HPI  HENT: Negative for nosebleeds.   Eyes: Negative for photophobia and discharge.  Respiratory: Positive for cough and wheezing. Negative for shortness of breath.   Cardiovascular: Negative for chest pain and palpitations.  Gastrointestinal: Negative for abdominal pain and blood in stool.  Genitourinary: Negative for dysuria, frequency and hematuria.  Musculoskeletal: Positive for arthralgias, back pain and stiffness. Negative for neck pain.  Skin: Negative.   Neurological: Negative for dizziness, seizures and speech difficulty.  Psychiatric/Behavioral: Negative for hallucinations and confusion.      Allergies  Fluoxetine hcl; Methocarbamol; Metoclopramide hcl; Motrin; and Rabeprazole sodium  Home Medications   Current Outpatient Rx  Name  Route  Sig  Dispense  Refill  . albuterol-ipratropium (COMBIVENT) 18-103 MCG/ACT inhaler   Inhalation   Inhale 2 puffs into the lungs every 6 (six) hours as needed for wheezing.         . benazepril (LOTENSIN) 10 MG tablet   Oral   Take 10 mg by mouth daily.         Marland Kitchen ezetimibe (ZETIA) 10 MG tablet   Oral   Take 10 mg by mouth daily.          . fenofibrate 160 MG tablet   Oral   Take 160 mg by mouth daily.          Marland Kitchen  furosemide (LASIX) 20 MG tablet   Oral   Take 20 mg by mouth daily.          Marland Kitchen glucose blood test strip   Other   1 each by Other route as needed for other. Use as instructed         . glyBURIDE-metformin (GLUCOVANCE) 2.5-500 MG per tablet   Oral   Take 1 tablet by mouth 2 (two) times daily with a meal.          . omeprazole (PRILOSEC) 20 MG capsule   Oral   Take 20 mg by mouth daily.          Marland Kitchen oxyCODONE-acetaminophen (PERCOCET/ROXICET) 5-325 MG per tablet   Oral   Take 1 tablet by mouth every 6 (six) hours as needed for pain.          Marland Kitchen PARoxetine (PAXIL) 20 MG tablet    Oral   Take 20 mg by mouth every morning.          . potassium chloride (K-DUR) 10 MEQ tablet   Oral   Take 1 tablet by mouth daily.         Marland Kitchen PROAIR HFA 108 (90 BASE) MCG/ACT inhaler   Inhalation   Inhale 2 puffs into the lungs every 6 (six) hours as needed.          BP 187/96  Pulse 82  Temp(Src) 98.2 F (36.8 C) (Oral)  Resp 18  Ht 4\' 10"  (1.473 m)  Wt 138 lb (62.596 kg)  BMI 28.85 kg/m2  SpO2 99% Physical Exam  Nursing note and vitals reviewed. Constitutional: She is oriented to person, place, and time. She appears well-developed and well-nourished.  Non-toxic appearance.  HENT:  Head: Normocephalic.  Right Ear: Tympanic membrane and external ear normal.  Left Ear: Tympanic membrane and external ear normal.  Eyes: EOM and lids are normal. Pupils are equal, round, and reactive to light.  Neck: Normal range of motion. Neck supple. Carotid bruit is not present.  Cardiovascular: Normal rate, regular rhythm, normal heart sounds, intact distal pulses and normal pulses.   Pulmonary/Chest: No respiratory distress. She has rhonchi.  A few scattered wheezes present.  Abdominal: Soft. Bowel sounds are normal. There is no tenderness. There is no guarding.  Musculoskeletal: Normal range of motion.  There is full range of motion of the right and left upper extremities.  There is decreased range of motion of the lower extremities. There are multiple surgical scars particularly at the knees of the right and left lower extremities. There is significant decrease in range of motion of the left knee. There no bruises appreciated. There is pain to attempted range of motion of the right and left knee, left greater than right. There's no deformity other than previous surgical deformity. The dorsalis pedis pulse is 2+ bilaterally. There is full range of motion of the toes of the right and left foot.  Lymphadenopathy:       Head (right side): No submandibular adenopathy present.       Head  (left side): No submandibular adenopathy present.    She has no cervical adenopathy.  Neurological: She is alert and oriented to person, place, and time. She has normal strength. No cranial nerve deficit or sensory deficit.  Skin: Skin is warm and dry.  Psychiatric: She has a normal mood and affect. Her speech is normal.    ED Course  Procedures (including critical care time) Labs Review Labs Reviewed - No data  to display Imaging Review No results found.  EKG Interpretation   None       MDM   Final diagnoses:  None    **I have reviewed nursing notes, vital signs, and all appropriate lab and imaging results for this patient.*  The patient uses a walker at home, turned the wrong way according to the patient and fell. She has had extensive surgery on her left knee. She's had surgery of the right knee as well. She has increasing pain, and was concerned about the hardware in her knee.  The x-ray of the left knee is negative for fracture or dislocation. The hardware appears to be intact. No hot areas appreciated. X-ray of the pelvis is negative for fracture or dislocation.  The patient is advised to use her walker. To use Tylenol every 4 hours for discomfort. To see Dr. Lorriane Shire in the office for additional evaluation and management of this issue.  Lenox Ahr, PA-C 05/12/13 1737

## 2013-05-12 NOTE — ED Notes (Signed)
EMS reports pt was walking in her home,  "turned wrong" and fell.  Pt usually walks with cane.  Has history of knee surgery.  Pt has been ambulatory since the fall.

## 2013-05-16 NOTE — ED Provider Notes (Signed)
Medical screening examination/treatment/procedure(s) were performed by non-physician practitioner and as supervising physician I was immediately available for consultation/collaboration.  EKG Interpretation   None        Nat Christen, MD 05/16/13 2055

## 2014-09-06 ENCOUNTER — Telehealth: Payer: Self-pay | Admitting: Gastroenterology

## 2014-09-06 NOTE — Telephone Encounter (Signed)
This patient is on July recall list for colonoscopy. I'm not seeing in epic when her last one was done. I'm thinking this maybe a 10 year repeat that needs to be triage. Can you check on this for me

## 2014-09-07 NOTE — Telephone Encounter (Signed)
Letter mailed for him to call and get scheduled

## 2015-06-28 ENCOUNTER — Other Ambulatory Visit (HOSPITAL_COMMUNITY): Payer: Self-pay | Admitting: Family Medicine

## 2015-06-28 DIAGNOSIS — M545 Low back pain: Secondary | ICD-10-CM

## 2015-07-05 ENCOUNTER — Ambulatory Visit (HOSPITAL_COMMUNITY)
Admission: RE | Admit: 2015-07-05 | Discharge: 2015-07-05 | Disposition: A | Discharge status: Home / Self Care | Payer: Medicare Other | Source: Ambulatory Visit | Attending: Family Medicine | Admitting: Family Medicine

## 2015-07-05 DIAGNOSIS — M4806 Spinal stenosis, lumbar region: Secondary | ICD-10-CM | POA: Insufficient documentation

## 2015-07-05 DIAGNOSIS — M5430 Sciatica, unspecified side: Secondary | ICD-10-CM | POA: Diagnosis not present

## 2015-07-05 DIAGNOSIS — M545 Low back pain: Secondary | ICD-10-CM | POA: Insufficient documentation

## 2015-07-05 DIAGNOSIS — M4856XA Collapsed vertebra, not elsewhere classified, lumbar region, initial encounter for fracture: Secondary | ICD-10-CM | POA: Insufficient documentation

## 2015-07-09 ENCOUNTER — Encounter (HOSPITAL_COMMUNITY): Payer: Self-pay | Admitting: Emergency Medicine

## 2015-07-09 ENCOUNTER — Emergency Department (HOSPITAL_COMMUNITY)
Admission: EM | Admit: 2015-07-09 | Discharge: 2015-07-09 | Disposition: A | Discharge status: Home / Self Care | Payer: Medicare Other | Attending: Emergency Medicine | Admitting: Emergency Medicine

## 2015-07-09 ENCOUNTER — Emergency Department (HOSPITAL_COMMUNITY): Payer: Medicare Other

## 2015-07-09 DIAGNOSIS — J449 Chronic obstructive pulmonary disease, unspecified: Secondary | ICD-10-CM | POA: Diagnosis not present

## 2015-07-09 DIAGNOSIS — Y939 Activity, unspecified: Secondary | ICD-10-CM | POA: Diagnosis not present

## 2015-07-09 DIAGNOSIS — Z7984 Long term (current) use of oral hypoglycemic drugs: Secondary | ICD-10-CM | POA: Insufficient documentation

## 2015-07-09 DIAGNOSIS — S93402A Sprain of unspecified ligament of left ankle, initial encounter: Secondary | ICD-10-CM | POA: Insufficient documentation

## 2015-07-09 DIAGNOSIS — E119 Type 2 diabetes mellitus without complications: Secondary | ICD-10-CM | POA: Diagnosis not present

## 2015-07-09 DIAGNOSIS — I10 Essential (primary) hypertension: Secondary | ICD-10-CM | POA: Diagnosis not present

## 2015-07-09 DIAGNOSIS — M1990 Unspecified osteoarthritis, unspecified site: Secondary | ICD-10-CM | POA: Insufficient documentation

## 2015-07-09 DIAGNOSIS — Y929 Unspecified place or not applicable: Secondary | ICD-10-CM | POA: Insufficient documentation

## 2015-07-09 DIAGNOSIS — Z79899 Other long term (current) drug therapy: Secondary | ICD-10-CM | POA: Insufficient documentation

## 2015-07-09 DIAGNOSIS — Z87891 Personal history of nicotine dependence: Secondary | ICD-10-CM | POA: Insufficient documentation

## 2015-07-09 DIAGNOSIS — S93422A Sprain of deltoid ligament of left ankle, initial encounter: Secondary | ICD-10-CM

## 2015-07-09 DIAGNOSIS — W19XXXA Unspecified fall, initial encounter: Secondary | ICD-10-CM | POA: Insufficient documentation

## 2015-07-09 DIAGNOSIS — L03116 Cellulitis of left lower limb: Secondary | ICD-10-CM | POA: Insufficient documentation

## 2015-07-09 DIAGNOSIS — S99912A Unspecified injury of left ankle, initial encounter: Secondary | ICD-10-CM | POA: Diagnosis present

## 2015-07-09 DIAGNOSIS — Y999 Unspecified external cause status: Secondary | ICD-10-CM | POA: Insufficient documentation

## 2015-07-09 MED ORDER — SULFAMETHOXAZOLE-TRIMETHOPRIM 800-160 MG PO TABS: 1.0000 | ORAL_TABLET | Freq: Two times a day (BID) | ORAL | Status: AC

## 2015-07-09 MED ORDER — HYDROCODONE-ACETAMINOPHEN 5-325 MG PO TABS: ORAL_TABLET | ORAL | Status: DC

## 2015-07-09 NOTE — ED Notes (Signed)
Pt reports pain in left ankle with redness to inner ankle x1 week.  Pt states she doesn't know how she hurt it but then mention that she fell this past week but does not remember what day.  Pt having difficulty with ambulation.

## 2015-07-09 NOTE — Discharge Instructions (Signed)
Ankle Sprain An ankle sprain is an injury to the strong, fibrous tissues (ligaments) that hold your ankle bones together.  HOME CARE   Put ice on your ankle for 1-2 days or as told by your doctor.  Put ice in a plastic bag.  Place a towel between your skin and the bag.  Leave the ice on for 15-20 minutes at a time, every 2 hours while you are awake.  Only take medicine as told by your doctor.  Raise (elevate) your injured ankle above the level of your heart as much as possible for 2-3 days.  Use crutches if your doctor tells you to. Slowly put your own weight on the affected ankle. Use the crutches until you can walk without pain.  If you have a plaster splint:  Do not rest it on anything harder than a pillow for 24 hours.  Do not put weight on it.  Do not get it wet.  Take it off to shower or bathe.  If given, use an elastic wrap or support stocking for support. Take the wrap off if your toes lose feeling (numb), tingle, or turn cold or blue.  If you have an air splint:  Add or let out air to make it comfortable.  Take it off at night and to shower and bathe.  Wiggle your toes and move your ankle up and down often while you are wearing it. GET HELP IF:  You have rapidly increasing bruising or puffiness (swelling).  Your toes feel very cold.  You lose feeling in your foot.  Your medicine does not help your pain. GET HELP RIGHT AWAY IF:   Your toes lose feeling (numb) or turn blue.  You have severe pain that is increasing. MAKE SURE YOU:   Understand these instructions.  Will watch your condition.  Will get help right away if you are not doing well or get worse.   This information is not intended to replace advice given to you by your health care provider. Make sure you discuss any questions you have with your health care provider.   Document Released: 08/28/2007 Document Revised: 04/01/2014 Document Reviewed: 09/23/2011 Elsevier Interactive Patient  Education 2016 Elsevier Inc.  Cellulitis Cellulitis is an infection of the skin and the tissue under the skin. The infected area is usually red and tender. This happens most often in the arms and lower legs. HOME CARE   Take your antibiotic medicine as told. Finish the medicine even if you start to feel better.  Keep the infected arm or leg raised (elevated).  Put a warm cloth on the area up to 4 times per day.  Only take medicines as told by your doctor.  Keep all doctor visits as told. GET HELP IF:  You see red streaks on the skin coming from the infected area.  Your red area gets bigger or turns a dark color.  Your bone or joint under the infected area is painful after the skin heals.  Your infection comes back in the same area or different area.  You have a puffy (swollen) bump in the infected area.  You have new symptoms.  You have a fever. GET HELP RIGHT AWAY IF:   You feel very sleepy.  You throw up (vomit) or have watery poop (diarrhea).  You feel sick and have muscle aches and pains.   This information is not intended to replace advice given to you by your health care provider. Make sure you discuss any questions  you have with your health care provider.   Document Released: 08/28/2007 Document Revised: 11/30/2014 Document Reviewed: 05/27/2011 Elsevier Interactive Patient Education [employer].

## 2015-07-09 NOTE — ED Provider Notes (Signed)
CSN: [id number]     Arrival date & time 07/09/15  1503 History  By signing my name below, I, Kristen Jones, attest that this documentation has been prepared under the direction and in the presence of Kristen Kareem, PA-C. Electronically Signed: Randa Jones, ED Scribe. 07/09/2015. 3:32 PM.    Chief Complaint  Patient presents with  . Ankle Pain    The history is provided by the patient. No language interpreter was used.   HPI Comments: Kristen Jones is a 65 y.o. female who presents to the Emergency Department complaining of left ankle pain onset 1 week prior. She reports a fall, but does not think the ankle pain is associated with it. Pt states she has associated redness to the medial aspect of her ankle, but she is unsure of onset.  Pt states that bearing weight makes the pain worse, improves at rest. Pt denies any treatments or medications PTA. Pt denies numbness or tingling, pain to the lower leg, fever or chills.     Past Medical History  Diagnosis Date  . Diabetes mellitus   . High cholesterol   . Hypertension   . Arthritis   . COPD (chronic obstructive pulmonary disease) [facility])    Past Surgical History  Procedure Laterality Date  . Knee surgery    . Cholecystectomy    . Multiple extractions with alveoloplasty  06/10/2011    Procedure: MULTIPLE EXTRACION WITH ALVEOLOPLASTY;  Surgeon: Gae Bon, DDS;  Location: [place];  Service: Oral Surgery;  Laterality: Bilateral;  Extractions of one,six,eight,nine,eleven,twenty,twenty-one,twenty-two,twenty-four,twenty-five,twenty-six,twenty-seven,twenty-eight,twenty-nine   Family History  Problem Relation Age of Onset  . Anesthesia problems Neg Hx   . Hypotension Neg Hx   . Malignant hyperthermia Neg Hx   . Pseudochol deficiency Neg Hx    Social History  Substance Use Topics  . Smoking status: Former Research scientist (life sciences)  . Smokeless tobacco: None  . Alcohol Use: No   OB History    No data available      Review of Systems   Constitutional: Negative for fever and chills.  Genitourinary: Negative for dysuria and difficulty urinating.  Musculoskeletal: Positive for arthralgias (left ankle pain). Negative for back pain, joint swelling and neck pain.  Skin: Positive for color change. Negative for wound.  Neurological: Negative for dizziness, syncope and numbness.  All other systems reviewed and are negative.    Allergies  Fluoxetine hcl; Methocarbamol; Metoclopramide hcl; Motrin; and Rabeprazole sodium  Home Medications   Prior to Admission medications   Medication Sig Start Date End Date Taking? Authorizing Provider  albuterol-ipratropium (COMBIVENT) 18-103 MCG/ACT inhaler Inhale 2 puffs into the lungs every 6 (six) hours as needed for wheezing.    Historical Provider, MD  benazepril (LOTENSIN) 10 MG tablet Take 10 mg by mouth daily.    Historical Provider, MD  ezetimibe (ZETIA) 10 MG tablet Take 10 mg by mouth daily.     Historical Provider, MD  fenofibrate 160 MG tablet Take 160 mg by mouth daily.     Historical Provider, MD  furosemide (LASIX) 20 MG tablet Take 20 mg by mouth daily.     Historical Provider, MD  glucose blood test strip 1 each by Other route as needed for other. Use as instructed    Historical Provider, MD  glyBURIDE-metformin (GLUCOVANCE) 2.5-500 MG per tablet Take 1 tablet by mouth 2 (two) times daily with a meal.     Historical Provider, MD  omeprazole (PRILOSEC) 20 MG capsule Take 20 mg by mouth daily.  Historical Provider, MD  oxyCODONE-acetaminophen (PERCOCET/ROXICET) 5-325 MG per tablet Take 1 tablet by mouth every 6 (six) hours as needed for pain.  11/12/12   Historical Provider, MD  PARoxetine (PAXIL) 20 MG tablet Take 20 mg by mouth every morning.     Historical Provider, MD  potassium chloride (K-DUR) 10 MEQ tablet Take 1 tablet by mouth daily. 10/09/12   Historical Provider, MD  PROAIR HFA 108 (90 BASE) MCG/ACT inhaler Inhale 2 puffs into the lungs every 6 (six) hours as needed.  11/25/12   Historical Provider, MD   BP 153/66 mmHg  Pulse 95  Temp(Src) 98.3 F (36.8 C) (Oral)  Resp 19  Ht 4\' 10"  (1.473 m)  Wt 129 lb (58.514 kg)  BMI 26.97 kg/m2  SpO2 100%   Physical Exam  Constitutional: She is oriented to person, place, and time. She appears well-developed and well-nourished. No distress.  HENT:  Head: Normocephalic and atraumatic.  Eyes: Conjunctivae are normal. Pupils are equal, round, and reactive to light.  Neck: Normal range of motion. Neck supple. No tracheal deviation present.  Cardiovascular: Normal rate, regular rhythm and intact distal pulses.   Pulmonary/Chest: Effort normal and breath sounds normal. No respiratory distress.  Musculoskeletal: Normal range of motion. She exhibits tenderness.  Mild edema and tenderness of the medial left ankle, slight erythema present, no open wounds, sensation and pulses intact, no proximal tenderness. Compartments soft  Neurological: She is alert and oriented to person, place, and time.  Skin: Skin is warm and dry.  Psychiatric: She has a normal mood and affect. Her behavior is normal.  Nursing note and vitals reviewed.   ED Course  Procedures (including critical care time) DIAGNOSTIC STUDIES: Oxygen Saturation is 100% on RA, normal by my interpretation.    COORDINATION OF CARE: 3:30 PM-Discussed treatment plan with pt at bedside and pt agreed to plan.     Labs Review Labs Reviewed - No data to display  Imaging Review Dg Ankle Complete Left  07/09/2015  CLINICAL DATA:  Patient with left ankle pain and redness for 1 week. Status post fall. Initial encounter. EXAM: LEFT ANKLE COMPLETE - 3+ VIEW COMPARISON:  Multiple prior exams including 10/09/2008 FINDINGS: Normal anatomic alignment. No evidence for acute fracture dislocation. Circumscribed round lucencies within the midshaft of the tibia likely post surgical in etiology. Regional soft tissues unremarkable. IMPRESSION: No acute osseous abnormality.  Electronically Signed   By: Lovey Newcomer M.D.   On: 07/09/2015 16:14      EKG Interpretation None      MDM   Final diagnoses:  Sprain, ankle joint, medial, left, initial encounter  Cellulitis of left lower extremity    Pt is well appearing.  NVI.  diffuse ttp of the left ankle.  No open wounds.  Localized 4 cm area of erythema, possible early cellulitis.  She agrees to bactrim, RICE therapy and close PMD or orthopedic f/u.  She appears stable for d/c  I personally performed the services described in this documentation, which was scribed in my presence. The recorded information has been reviewed and is accurate.      Kem Parkinson, PA-C 07/11/15 [id number]  Daleen Bo, MD 07/12/15 [phone]

## 2016-06-22 ENCOUNTER — Encounter (HOSPITAL_COMMUNITY): Payer: Self-pay | Admitting: *Deleted

## 2016-06-22 ENCOUNTER — Emergency Department (HOSPITAL_COMMUNITY)
Admission: EM | Admit: 2016-06-22 | Discharge: 2016-06-22 | Disposition: A | Discharge status: Home / Self Care | Payer: Medicare Other | Attending: Emergency Medicine | Admitting: Emergency Medicine

## 2016-06-22 DIAGNOSIS — E119 Type 2 diabetes mellitus without complications: Secondary | ICD-10-CM | POA: Insufficient documentation

## 2016-06-22 DIAGNOSIS — Z79899 Other long term (current) drug therapy: Secondary | ICD-10-CM | POA: Diagnosis not present

## 2016-06-22 DIAGNOSIS — J45909 Unspecified asthma, uncomplicated: Secondary | ICD-10-CM | POA: Insufficient documentation

## 2016-06-22 DIAGNOSIS — J449 Chronic obstructive pulmonary disease, unspecified: Secondary | ICD-10-CM | POA: Diagnosis not present

## 2016-06-22 DIAGNOSIS — R112 Nausea with vomiting, unspecified: Secondary | ICD-10-CM | POA: Diagnosis present

## 2016-06-22 DIAGNOSIS — N12 Tubulo-interstitial nephritis, not specified as acute or chronic: Secondary | ICD-10-CM | POA: Diagnosis not present

## 2016-06-22 DIAGNOSIS — I10 Essential (primary) hypertension: Secondary | ICD-10-CM | POA: Insufficient documentation

## 2016-06-22 DIAGNOSIS — Z87891 Personal history of nicotine dependence: Secondary | ICD-10-CM | POA: Insufficient documentation

## 2016-06-22 DIAGNOSIS — Z7984 Long term (current) use of oral hypoglycemic drugs: Secondary | ICD-10-CM | POA: Insufficient documentation

## 2016-06-22 LAB — URINALYSIS, ROUTINE W REFLEX MICROSCOPIC
Bilirubin Urine: NEGATIVE
GLUCOSE, UA: 50 mg/dL — AB
Ketones, ur: 5 mg/dL — AB
NITRITE: POSITIVE — AB
PH: 5 (ref 5.0–8.0)
Protein, ur: NEGATIVE mg/dL
SPECIFIC GRAVITY, URINE: 1.016 (ref 1.005–1.030)

## 2016-06-22 LAB — COMPREHENSIVE METABOLIC PANEL
ALT: 27 U/L (ref 14–54)
ANION GAP: 13 (ref 5–15)
AST: 38 U/L (ref 15–41)
Albumin: 4.1 g/dL (ref 3.5–5.0)
Alkaline Phosphatase: 35 U/L — ABNORMAL LOW (ref 38–126)
BUN: 35 mg/dL — ABNORMAL HIGH (ref 6–20)
CALCIUM: 9.7 mg/dL (ref 8.9–10.3)
CO2: 21 mmol/L — ABNORMAL LOW (ref 22–32)
CREATININE: 1.7 mg/dL — AB (ref 0.44–1.00)
Chloride: 106 mmol/L (ref 101–111)
GFR, EST AFRICAN AMERICAN: 36 mL/min — AB (ref >60)
GFR, EST NON AFRICAN AMERICAN: 31 mL/min — AB (ref >60)
Glucose, Bld: 139 mg/dL — ABNORMAL HIGH (ref 65–99)
Potassium: 4.1 mmol/L (ref 3.5–5.1)
SODIUM: 140 mmol/L (ref 135–145)
Total Bilirubin: 0.9 mg/dL (ref 0.3–1.2)
Total Protein: 7.7 g/dL (ref 6.5–8.1)

## 2016-06-22 LAB — CBC
HCT: 39.3 % (ref 36.0–46.0)
Hemoglobin: 13 g/dL (ref 12.0–15.0)
MCH: 30.2 pg (ref 26.0–34.0)
MCHC: 33.1 g/dL (ref 30.0–36.0)
MCV: 91.2 fL (ref 78.0–100.0)
PLATELETS: 199 10*3/uL (ref 150–400)
RBC: 4.31 MIL/uL (ref 3.87–5.11)
RDW: 15.2 % (ref 11.5–15.5)
WBC: 10.6 10*3/uL — AB (ref 4.0–10.5)

## 2016-06-22 LAB — LIPASE, BLOOD: Lipase: 14 U/L (ref 11–51)

## 2016-06-22 MED ORDER — SODIUM CHLORIDE 0.9 % IV SOLN
1000.0000 mL | INTRAVENOUS | Status: DC
  Administered 2016-06-22: 1000 mL via INTRAVENOUS

## 2016-06-22 MED ORDER — ACETAMINOPHEN 500 MG PO TABS
1000.0000 mg | ORAL_TABLET | Freq: Once | ORAL | Status: AC
  Administered 2016-06-22: 1000 mg via ORAL

## 2016-06-22 MED ORDER — PROCHLORPERAZINE EDISYLATE 5 MG/ML IJ SOLN
10.0000 mg | Freq: Once | INTRAMUSCULAR | Status: AC
  Administered 2016-06-22: 10 mg via INTRAVENOUS
  Filled 2016-06-22: qty 2

## 2016-06-22 MED ORDER — ONDANSETRON 4 MG PO TBDP
4.0000 mg | ORAL_TABLET | Freq: Once | ORAL | Status: AC | PRN
  Administered 2016-06-22: 4 mg via ORAL
  Filled 2016-06-22: qty 1

## 2016-06-22 MED ORDER — DEXTROSE 5 % IV SOLN
1.0000 g | Freq: Once | INTRAVENOUS | Status: AC
  Administered 2016-06-22: 1 g via INTRAVENOUS
  Filled 2016-06-22: qty 10

## 2016-06-22 MED ORDER — PROMETHAZINE HCL 25 MG RE SUPP
25.0000 mg | Freq: Four times a day (QID) | RECTAL | 0 refills | Status: DC | PRN
Start: 2016-06-22 — End: 2018-02-02

## 2016-06-22 MED ORDER — ACETAMINOPHEN 500 MG PO TABS
ORAL_TABLET | ORAL | Status: AC
  Filled 2016-06-22: qty 2

## 2016-06-22 MED ORDER — TRAMADOL HCL 50 MG PO TABS: ORAL_TABLET | ORAL | 0 refills | Status: DC

## 2016-06-22 MED ORDER — CEPHALEXIN 500 MG PO CAPS: 500.0000 mg | ORAL_CAPSULE | Freq: Four times a day (QID) | ORAL | 0 refills | Status: DC

## 2016-06-22 MED ORDER — SODIUM CHLORIDE 0.9 % IV SOLN
1000.0000 mL | Freq: Once | INTRAVENOUS | Status: AC
  Administered 2016-06-22: 1000 mL via INTRAVENOUS

## 2016-06-22 NOTE — ED Notes (Signed)
Two attempts made at establishing IV for patient. Both were in the left hand with 24G IV's.    Lab at bedside to attempt to get blood work

## 2016-06-22 NOTE — ED Triage Notes (Signed)
Given Sulfa Rx from Dr Benjie Karvonen on 3/30 Now complaining of N/V from med

## 2016-06-22 NOTE — Discharge Instructions (Signed)
Your examination suggest an infection that may be leading your bladder and entering toward your kidneys: Pyelonephritis. You were treated in the emergency department with intravenous Rocephin. Please use Keflex 4 times daily with food. Please use promethazine suppository every 6 hours as needed for nausea. May use Tylenol, for mild pain, may use Ultram for more severe pain. Please see Dr. Lorriane Shire for recheck of your urine in 7-10 days.

## 2016-06-22 NOTE — ED Provider Notes (Signed)
[facility] DEPT Provider Note   CSN: [id number] Arrival date & time: 06/22/16  1144     History   Chief Complaint Chief Complaint  Patient presents with  . Vomiting    HPI Kristen Jones is a 66 y.o. female.  Patient is a 66 year old female who presents to the emergency department with a complaint of nausea vomiting.  The patient states that she was seen on yesterday March 30 by her primary physician on. She told the physician about a foul-smelling urine, and some increased urine frequency. The patient was placed on Bactrim. The patient states that when she took the Bactrim she became ill with vomiting and generally not feeling well. The patient states that she has since that time it having problem with vomiting she been having back pain, and she continues to have urine frequency. She is not aware of any temperature elevation on. She's not seen any actual blood in her urine. She's not had any injury or trauma to the pelvis area. She denies any operations or procedures involving her pelvis or urinary area.      Past Medical History:  Diagnosis Date  . Arthritis   . COPD (chronic obstructive pulmonary disease) ([place])   . Diabetes mellitus   . High cholesterol   . Hypertension     Patient Active Problem List   Diagnosis Date Noted  . Fracture of metacarpal base of right hand, closed 07/24/2011  . High cholesterol   . ANKLE SPRAIN 11/01/2008  . FOOT SPRAIN, LEFT 11/01/2008  . HIGH BLOOD PRESSURE 11/01/2008  . Type II or unspecified type diabetes mellitus without mention of complication, not stated as uncontrolled 12/09/2007  . HYPERLIPIDEMIA 12/09/2007  . HYPERTENSION 12/09/2007  . ASTHMA 12/09/2007  . GASTROESOPHAGEAL REFLUX DISEASE, CHRONIC 12/09/2007  . IRRITABLE BOWEL SYNDROME 12/09/2007  . SCOLIOSIS 12/09/2007  . ABDOMINAL BLOATING 12/09/2007  . ABDOMINAL PAIN 12/09/2007  . CARPAL TUNNEL RELEASE, HX OF 12/09/2007    Past Surgical History:  Procedure  Laterality Date  . CHOLECYSTECTOMY    . KNEE SURGERY    . MULTIPLE EXTRACTIONS WITH ALVEOLOPLASTY  06/10/2011   Procedure: MULTIPLE EXTRACION WITH ALVEOLOPLASTY;  Surgeon: Gae Bon, DDS;  Location: [facility];  Service: Oral Surgery;  Laterality: Bilateral;  Extractions of one,six,eight,nine,eleven,twenty,twenty-one,twenty-two,twenty-four,twenty-five,twenty-six,twenty-seven,twenty-eight,twenty-nine    OB History    No data available       Home Medications    Prior to Admission medications   Medication Sig Start Date End Date Taking? Authorizing Provider  albuterol-ipratropium (COMBIVENT) 18-103 MCG/ACT inhaler Inhale 2 puffs into the lungs every 6 (six) hours as needed for wheezing.    Historical Provider, MD  benazepril (LOTENSIN) 10 MG tablet Take 10 mg by mouth daily.    Historical Provider, MD  ezetimibe (ZETIA) 10 MG tablet Take 10 mg by mouth daily.     Historical Provider, MD  fenofibrate 160 MG tablet Take 160 mg by mouth daily.     Historical Provider, MD  furosemide (LASIX) 20 MG tablet Take 20 mg by mouth daily.     Historical Provider, MD  glucose blood test strip 1 each by Other route as needed for other. Use as instructed    Historical Provider, MD  glyBURIDE-metformin (GLUCOVANCE) 2.5-500 MG per tablet Take 1 tablet by mouth 2 (two) times daily with a meal.     Historical Provider, MD  HYDROcodone-acetaminophen (NORCO/VICODIN) 5-325 MG tablet Take one tab po q 4-6 hrs prn pain 07/09/15   Tammy Triplett, PA-C  omeprazole (  PRILOSEC) 20 MG capsule Take 20 mg by mouth daily.     Historical Provider, MD  oxyCODONE-acetaminophen (PERCOCET/ROXICET) 5-325 MG per tablet Take 1 tablet by mouth every 6 (six) hours as needed for pain.  11/12/12   Historical Provider, MD  PARoxetine (PAXIL) 20 MG tablet Take 20 mg by mouth every morning.     Historical Provider, MD  potassium chloride (K-DUR) 10 MEQ tablet Take 1 tablet by mouth daily. 10/09/12   Historical Provider, MD  PROAIR HFA 108  (90 BASE) MCG/ACT inhaler Inhale 2 puffs into the lungs every 6 (six) hours as needed. 11/25/12   Historical Provider, MD    Family History Family History  Problem Relation Age of Onset  . Anesthesia problems Neg Hx   . Hypotension Neg Hx   . Malignant hyperthermia Neg Hx   . Pseudochol deficiency Neg Hx     Social History Social History  Substance Use Topics  . Smoking status: Former Research scientist (life sciences)  . Smokeless tobacco: Never Used  . Alcohol use No     Allergies   Fluoxetine hcl; Methocarbamol; Metoclopramide hcl; Motrin [ibuprofen]; and Rabeprazole sodium   Review of Systems Review of Systems  Constitutional: Positive for chills.  Genitourinary: Positive for frequency. Negative for hematuria.  Musculoskeletal: Positive for back pain and myalgias.  All other systems reviewed and are negative.    Physical Exam Updated Vital Signs BP (!) 138/56   Pulse 87   Temp 98.8 F (37.1 C) (Oral)   Resp 16   Ht 4\' 10"  (1.473 m)   Wt 66.2 kg   SpO2 100%   BMI 30.51 kg/m   Physical Exam  Constitutional: She is oriented to person, place, and time. She appears well-developed and well-nourished.  Non-toxic appearance.  HENT:  Head: Normocephalic.  Right Ear: Tympanic membrane and external ear normal.  Left Ear: Tympanic membrane and external ear normal.  Eyes: EOM and lids are normal. Pupils are equal, round, and reactive to light.  Neck: Normal range of motion. Neck supple. Carotid bruit is not present.  Cardiovascular: Normal rate, regular rhythm, normal heart sounds, intact distal pulses and normal pulses.   Pulmonary/Chest: Breath sounds normal. No respiratory distress.  Abdominal: Soft. Bowel sounds are normal. There is tenderness in the suprapubic area. There is CVA tenderness. There is no guarding.  Musculoskeletal: Normal range of motion.  Lymphadenopathy:       Head (right side): No submandibular adenopathy present.       Head (left side): No submandibular adenopathy  present.    She has no cervical adenopathy.  Neurological: She is alert and oriented to person, place, and time. She has normal strength. No cranial nerve deficit or sensory deficit.  Skin: Skin is warm and dry.  Psychiatric: She has a normal mood and affect. Her speech is normal.  Nursing note and vitals reviewed.    ED Treatments / Results  Labs (all labs ordered are listed, but only abnormal results are displayed) Labs Reviewed  COMPREHENSIVE METABOLIC PANEL - Abnormal; Notable for the following:       Result Value   CO2 21 (*)    Glucose, Bld 139 (*)    BUN 35 (*)    Creatinine, Ser 1.70 (*)    Alkaline Phosphatase 35 (*)    GFR calc non Af Amer 31 (*)    GFR calc Af Amer 36 (*)    All other components within normal limits  CBC - Abnormal; Notable for the following:  WBC 10.6 (*)    All other components within normal limits  URINALYSIS, ROUTINE W REFLEX MICROSCOPIC - Abnormal; Notable for the following:    APPearance CLOUDY (*)    Glucose, UA 50 (*)    Hgb urine dipstick SMALL (*)    Ketones, ur 5 (*)    Nitrite POSITIVE (*)    Leukocytes, UA LARGE (*)    Bacteria, UA FEW (*)    Squamous Epithelial / LPF 0-5 (*)    Non Squamous Epithelial 0-5 (*)    All other components within normal limits  URINE CULTURE  LIPASE, BLOOD    EKG  EKG Interpretation None       Radiology No results found.  Procedures Procedures (including critical care time)  Medications Ordered in ED Medications  0.9 %  sodium chloride infusion (1,000 mLs Intravenous New Bag/Given 06/22/16 1432)    Followed by  0.9 %  sodium chloride infusion (1,000 mLs Intravenous New Bag/Given 06/22/16 1433)  cefTRIAXone (ROCEPHIN) 1 g in dextrose 5 % 50 mL IVPB (not administered)  ondansetron (ZOFRAN-ODT) disintegrating tablet 4 mg (4 mg Oral Given 06/22/16 1159)  prochlorperazine (COMPAZINE) injection 10 mg (10 mg Intravenous Given 06/22/16 1433)     Initial Impression / Assessment and Plan / ED  Course  I have reviewed the triage vital signs and the nursing notes.  Pertinent labs & imaging results that were available during my care of the patient were reviewed by me and considered in my medical decision making (see chart for details).     **I have reviewed nursing notes, vital signs, and all appropriate lab and imaging results for this patient.*  Final Clinical Impressions(s) / ED Diagnoses MDM Vital signs within normal limits. Patient having active vomiting in tree showed an initially in the examination room. IV Compazine was given to the patient, as Zofran ODT did not help her vomiting in the waiting area. I have also ordered IV fluids for the patient.  The lipase returns normal. The competence of metabolic panel shows carbon dioxide to be slightly low at 21. The BUN is elevated at 35, the creatinine is elevated at 1.70. Patient has had elevations in her creatinine in the past. The anion gap is 13. The glomerular filtration rate is low at 31.  The complete blood count is slightly elevated at 10.6. Otherwise within normal limits. Urinalysis shows a cloudy yellow specimen with a specific gravity 1.016. There is 50 mg/daL of glucose. Nitrates and leukocyte esterase are present. There is actually large leukocyte esterase present. This too many to count white blood cells present. The white blood cells are present in clumps on.  Given the problem with back pain, nausea/vomiting, urine with a foul smell, and abnormal urinalysis, patient will be treated for possible early pyelonephritis. IV Rocephin has been given. IV fluids have been given. Medication for her nausea has also been given. Patient states she does not need medication for pain at this time.  Patient tolerated Rocephin without problem. Patient states she feels much better. His been no further vomiting since IV antibiotics.  We'll stop the Bactrim. Patient will be placed on Keflex. Prescription also given for promethazine  suppositories and Ultram for pain. Patient will follow-up with her primary physician for  recheck of the urine next week.    Final diagnoses:  Pyelonephritis    New Prescriptions New Prescriptions   No medications on file     Lily Kocher, PA-C 06/22/16 [place], MD  06/23/16 0902  

## 2016-06-24 LAB — URINE CULTURE: Culture: >=100000 — AB

## 2016-06-25 ENCOUNTER — Telehealth: Payer: Self-pay | Admitting: Emergency Medicine

## 2016-06-25 NOTE — Telephone Encounter (Signed)
Post ED Visit - Positive Culture Follow-up  Culture report reviewed by antimicrobial stewardship pharmacist:  []  Elenor Quinones, Pharm.D. []  Heide Guile, Pharm.D., BCPS AQ-ID []  Parks Neptune, Pharm.D., BCPS []  Alycia Rossetti, Pharm.D., BCPS []  [place], Pharm.D., BCPS, AAHIVP []  Legrand Como, Pharm.D., BCPS, AAHIVP []  Salome Arnt, PharmD, BCPS []  Dimitri Ped, PharmD, BCPS []  Vincenza Hews, PharmD, BCPS Maylon Cos PharmD   Positive URINE culture Treated with cephalexin, organism sensitive to the same and no further patient follow-up is required at this time.  Hazle Nordmann 06/25/2016, 1:31 PM

## 2016-10-31 ENCOUNTER — Other Ambulatory Visit: Payer: Self-pay | Admitting: Women's Health

## 2016-11-18 ENCOUNTER — Other Ambulatory Visit (HOSPITAL_COMMUNITY)
Admission: RE | Admit: 2016-11-18 | Discharge: 2016-11-18 | Disposition: A | Discharge status: Home / Self Care | Payer: Medicare Other | Source: Ambulatory Visit | Attending: Adult Health | Admitting: Adult Health

## 2016-11-18 ENCOUNTER — Encounter: Payer: Self-pay | Admitting: Adult Health

## 2016-11-18 ENCOUNTER — Other Ambulatory Visit: Payer: Self-pay | Admitting: Adult Health

## 2016-11-18 ENCOUNTER — Ambulatory Visit (INDEPENDENT_AMBULATORY_CARE_PROVIDER_SITE_OTHER): Payer: Medicare Other | Admitting: Adult Health

## 2016-11-18 VITALS — BP 110/60 | HR 74 | Ht <= 58 in | Wt 143.0 lb

## 2016-11-18 DIAGNOSIS — Z1212 Encounter for screening for malignant neoplasm of rectum: Secondary | ICD-10-CM

## 2016-11-18 DIAGNOSIS — Z124 Encounter for screening for malignant neoplasm of cervix: Secondary | ICD-10-CM | POA: Diagnosis not present

## 2016-11-18 DIAGNOSIS — Z1231 Encounter for screening mammogram for malignant neoplasm of breast: Secondary | ICD-10-CM

## 2016-11-18 DIAGNOSIS — Z1211 Encounter for screening for malignant neoplasm of colon: Secondary | ICD-10-CM | POA: Diagnosis not present

## 2016-11-18 DIAGNOSIS — Z01419 Encounter for gynecological examination (general) (routine) without abnormal findings: Secondary | ICD-10-CM

## 2016-11-18 LAB — HEMOCCULT GUIAC POC 1CARD (OFFICE): FECAL OCCULT BLD: NEGATIVE

## 2016-11-18 NOTE — Patient Instructions (Addendum)
Physical in 1 year Pap in 3 if normal Mammogram 9/10 at 4 pm at [facility] with PCP Colonoscopy per GI

## 2016-11-18 NOTE — Progress Notes (Signed)
Patient ID: Kristen Jones, female   DOB: 01-07-1951, 66 y.o.   MRN: [id number] History of Present Illness:  Kristen Jones is a 66 year old white female, widowed, new to this practice, in for well woman gyn exam and pap. PCP is Dr Cindie Laroche.  Current Medications, Allergies, Past Medical History, Past Surgical History, Family History and Social History were reviewed in [employer] record.     Review of Systems:  Patient denies any headaches, hearing loss, fatigue, blurred vision, shortness of breath, chest pain, abdominal pain, problems with bowel movements, urination, or intercourse(not having sex). No joint pain or mood swings. +vaginal odor  Physical Exam:BP 110/60 (BP Location: Left Arm, Patient Position: Sitting, Cuff Size: Small)   Pulse 74   Ht 4\' 9"  (1.448 m)   Wt 143 lb (64.9 kg)   BMI 30.94 kg/m  General:  Well developed, well nourished, no acute distress Skin:  Warm and dry Neck:  Midline trachea, normal thyroid, good ROM, no lymphadenopathy,no carotid bruits heard Lungs; Clear to auscultation bilaterally Breast:  No dominant palpable mass, retraction, or nipple discharge Cardiovascular: Regular rate and rhythm Abdomen:  Soft, non tender, no hepatosplenomegaly Pelvic:  External genitalia is normal in appearance, no lesions.  The vagina has loss of color, moisture and rugae, no odor. Marland Kitchen Urethra has no lesions or masses. The cervix is smooth, pap with HPV performed.  Uterus is felt to be normal size, shape, and contour.  No adnexal masses or tenderness noted.Bladder is non tender, no masses felt. Rectal: Good sphincter tone, no polyps, or hemorrhoids felt.  Hemoccult negative. Extremities/musculoskeletal:  No swelling or varicosities noted, no clubbing or cyanosis,has had bilateral knee and leg surgery, walks with limp Psych:  No mood changes, alert and cooperative,seems happy PHQ 2 score 0.Has been 8 years since mammogram will set up for her.   Impression: 1.  Encounter for routine gynecological examination with Papanicolaou smear of cervix   2. Screening for colorectal cancer       Plan: Physical in 1 year Pap in 3 if normal Mammogram 9/10 at 4 pm at [facility] with PCP Colonoscopy per GI

## 2016-11-20 LAB — CYTOLOGY - PAP
Diagnosis: NEGATIVE
HPV (WINDOPATH): NOT DETECTED

## 2016-12-02 ENCOUNTER — Ambulatory Visit (HOSPITAL_COMMUNITY): Payer: Medicare Other

## 2018-02-02 ENCOUNTER — Inpatient Hospital Stay (HOSPITAL_COMMUNITY)
Admission: EM | Admit: 2018-02-02 | Discharge: 2018-02-06 | DRG: 286 | Disposition: A | Discharge status: Home / Self Care | Payer: Medicare Other | Attending: Internal Medicine | Admitting: Internal Medicine

## 2018-02-02 ENCOUNTER — Emergency Department (HOSPITAL_COMMUNITY): Payer: Medicare Other

## 2018-02-02 ENCOUNTER — Inpatient Hospital Stay (HOSPITAL_COMMUNITY): Payer: Medicare Other

## 2018-02-02 ENCOUNTER — Encounter (HOSPITAL_COMMUNITY): Payer: Self-pay | Admitting: *Deleted

## 2018-02-02 ENCOUNTER — Other Ambulatory Visit: Payer: Self-pay

## 2018-02-02 DIAGNOSIS — Z7951 Long term (current) use of inhaled steroids: Secondary | ICD-10-CM | POA: Diagnosis not present

## 2018-02-02 DIAGNOSIS — F418 Other specified anxiety disorders: Secondary | ICD-10-CM

## 2018-02-02 DIAGNOSIS — Z7984 Long term (current) use of oral hypoglycemic drugs: Secondary | ICD-10-CM

## 2018-02-02 DIAGNOSIS — R9431 Abnormal electrocardiogram [ECG] [EKG]: Secondary | ICD-10-CM | POA: Diagnosis not present

## 2018-02-02 DIAGNOSIS — Z23 Encounter for immunization: Secondary | ICD-10-CM | POA: Diagnosis present

## 2018-02-02 DIAGNOSIS — E781 Pure hyperglyceridemia: Secondary | ICD-10-CM | POA: Diagnosis present

## 2018-02-02 DIAGNOSIS — I130 Hypertensive heart and chronic kidney disease with heart failure and stage 1 through stage 4 chronic kidney disease, or unspecified chronic kidney disease: Secondary | ICD-10-CM | POA: Diagnosis present

## 2018-02-02 DIAGNOSIS — E1122 Type 2 diabetes mellitus with diabetic chronic kidney disease: Secondary | ICD-10-CM | POA: Diagnosis present

## 2018-02-02 DIAGNOSIS — N179 Acute kidney failure, unspecified: Secondary | ICD-10-CM

## 2018-02-02 DIAGNOSIS — I161 Hypertensive emergency: Secondary | ICD-10-CM | POA: Diagnosis present

## 2018-02-02 DIAGNOSIS — E119 Type 2 diabetes mellitus without complications: Secondary | ICD-10-CM

## 2018-02-02 DIAGNOSIS — E785 Hyperlipidemia, unspecified: Secondary | ICD-10-CM | POA: Diagnosis not present

## 2018-02-02 DIAGNOSIS — D631 Anemia in chronic kidney disease: Secondary | ICD-10-CM | POA: Diagnosis present

## 2018-02-02 DIAGNOSIS — E7800 Pure hypercholesterolemia, unspecified: Secondary | ICD-10-CM | POA: Diagnosis present

## 2018-02-02 DIAGNOSIS — I351 Nonrheumatic aortic (valve) insufficiency: Secondary | ICD-10-CM

## 2018-02-02 DIAGNOSIS — Z7982 Long term (current) use of aspirin: Secondary | ICD-10-CM | POA: Diagnosis not present

## 2018-02-02 DIAGNOSIS — J9601 Acute respiratory failure with hypoxia: Secondary | ICD-10-CM | POA: Diagnosis present

## 2018-02-02 DIAGNOSIS — I5043 Acute on chronic combined systolic (congestive) and diastolic (congestive) heart failure: Secondary | ICD-10-CM | POA: Diagnosis present

## 2018-02-02 DIAGNOSIS — J45909 Unspecified asthma, uncomplicated: Secondary | ICD-10-CM | POA: Diagnosis present

## 2018-02-02 DIAGNOSIS — E1169 Type 2 diabetes mellitus with other specified complication: Secondary | ICD-10-CM | POA: Diagnosis not present

## 2018-02-02 DIAGNOSIS — Z79899 Other long term (current) drug therapy: Secondary | ICD-10-CM

## 2018-02-02 DIAGNOSIS — J810 Acute pulmonary edema: Secondary | ICD-10-CM | POA: Diagnosis present

## 2018-02-02 DIAGNOSIS — Z886 Allergy status to analgesic agent status: Secondary | ICD-10-CM

## 2018-02-02 DIAGNOSIS — Z888 Allergy status to other drugs, medicaments and biological substances status: Secondary | ICD-10-CM | POA: Diagnosis not present

## 2018-02-02 DIAGNOSIS — I509 Heart failure, unspecified: Secondary | ICD-10-CM

## 2018-02-02 DIAGNOSIS — J449 Chronic obstructive pulmonary disease, unspecified: Secondary | ICD-10-CM | POA: Diagnosis present

## 2018-02-02 DIAGNOSIS — R7989 Other specified abnormal findings of blood chemistry: Secondary | ICD-10-CM | POA: Diagnosis present

## 2018-02-02 DIAGNOSIS — I10 Essential (primary) hypertension: Secondary | ICD-10-CM | POA: Diagnosis present

## 2018-02-02 DIAGNOSIS — N183 Chronic kidney disease, stage 3 (moderate): Secondary | ICD-10-CM | POA: Diagnosis present

## 2018-02-02 DIAGNOSIS — I214 Non-ST elevation (NSTEMI) myocardial infarction: Secondary | ICD-10-CM | POA: Diagnosis not present

## 2018-02-02 DIAGNOSIS — N189 Chronic kidney disease, unspecified: Secondary | ICD-10-CM

## 2018-02-02 DIAGNOSIS — R778 Other specified abnormalities of plasma proteins: Secondary | ICD-10-CM

## 2018-02-02 DIAGNOSIS — K219 Gastro-esophageal reflux disease without esophagitis: Secondary | ICD-10-CM | POA: Diagnosis present

## 2018-02-02 DIAGNOSIS — Z87891 Personal history of nicotine dependence: Secondary | ICD-10-CM | POA: Diagnosis not present

## 2018-02-02 DIAGNOSIS — I5041 Acute combined systolic (congestive) and diastolic (congestive) heart failure: Secondary | ICD-10-CM | POA: Diagnosis not present

## 2018-02-02 DIAGNOSIS — I2510 Atherosclerotic heart disease of native coronary artery without angina pectoris: Secondary | ICD-10-CM | POA: Diagnosis present

## 2018-02-02 HISTORY — DX: Other specified anxiety disorders: F41.8

## 2018-02-02 LAB — BASIC METABOLIC PANEL
Anion gap: 7 (ref 5–15)
BUN: 18 mg/dL (ref 8–23)
CALCIUM: 8.9 mg/dL (ref 8.9–10.3)
CO2: 25 mmol/L (ref 22–32)
CREATININE: 1.2 mg/dL — AB (ref 0.44–1.00)
Chloride: 107 mmol/L (ref 98–111)
GFR calc Af Amer: 54 mL/min — ABNORMAL LOW (ref >60)
GFR, EST NON AFRICAN AMERICAN: 46 mL/min — AB (ref >60)
Glucose, Bld: 238 mg/dL — ABNORMAL HIGH (ref 70–99)
Potassium: 4.8 mmol/L (ref 3.5–5.1)
SODIUM: 139 mmol/L (ref 135–145)

## 2018-02-02 LAB — CBC
HCT: 37.9 % (ref 36.0–46.0)
Hemoglobin: 11.6 g/dL — ABNORMAL LOW (ref 12.0–15.0)
MCH: 28.9 pg (ref 26.0–34.0)
MCHC: 30.6 g/dL (ref 30.0–36.0)
MCV: 94.5 fL (ref 80.0–100.0)
PLATELETS: 232 10*3/uL (ref 150–400)
RBC: 4.01 MIL/uL (ref 3.87–5.11)
RDW: 15 % (ref 11.5–15.5)
WBC: 11.9 10*3/uL — AB (ref 4.0–10.5)
nRBC: 0 % (ref 0.0–0.2)

## 2018-02-02 LAB — ECHOCARDIOGRAM COMPLETE
Height: 58 in
Weight: 2240 oz

## 2018-02-02 LAB — GLUCOSE, CAPILLARY: Glucose-Capillary: 154 mg/dL — ABNORMAL HIGH (ref 70–99)

## 2018-02-02 LAB — PROTIME-INR
INR: 1.01
Prothrombin Time: 13.2 seconds (ref 11.4–15.2)

## 2018-02-02 LAB — MAGNESIUM: MAGNESIUM: 1.6 mg/dL — AB (ref 1.7–2.4)

## 2018-02-02 LAB — I-STAT TROPONIN, ED: Troponin i, poc: 0.02 ng/mL (ref 0.00–0.08)

## 2018-02-02 LAB — BRAIN NATRIURETIC PEPTIDE: B NATRIURETIC PEPTIDE 5: 579 pg/mL — AB (ref 0.0–100.0)

## 2018-02-02 LAB — HEPARIN LEVEL (UNFRACTIONATED): Heparin Unfractionated: 0.36 IU/mL (ref 0.30–0.70)

## 2018-02-02 LAB — TROPONIN I
Troponin I: 0.05 ng/mL (ref <0.03)
Troponin I: 0.79 ng/mL (ref <0.03)
Troponin I: 1.13 ng/mL (ref <0.03)

## 2018-02-02 LAB — CBG MONITORING, ED: GLUCOSE-CAPILLARY: 202 mg/dL — AB (ref 70–99)

## 2018-02-02 LAB — PROCALCITONIN: Procalcitonin: 0.5 ng/mL

## 2018-02-02 LAB — TSH: TSH: 3.143 u[IU]/mL (ref 0.350–4.500)

## 2018-02-02 MED ORDER — FUROSEMIDE 10 MG/ML IJ SOLN
40.0000 mg | Freq: Two times a day (BID) | INTRAMUSCULAR | Status: DC
  Administered 2018-02-02: 40 mg via INTRAVENOUS
  Filled 2018-02-02: qty 4

## 2018-02-02 MED ORDER — ASPIRIN 81 MG PO CHEW
324.0000 mg | CHEWABLE_TABLET | Freq: Once | ORAL | Status: AC
  Administered 2018-02-02: 324 mg via ORAL
  Filled 2018-02-02: qty 4

## 2018-02-02 MED ORDER — ONDANSETRON HCL 4 MG/2ML IJ SOLN: 4.0000 mg | Freq: Four times a day (QID) | INTRAMUSCULAR | Status: DC | PRN

## 2018-02-02 MED ORDER — IPRATROPIUM BROMIDE 0.02 % IN SOLN
RESPIRATORY_TRACT | Status: AC
  Filled 2018-02-02: qty 2.5

## 2018-02-02 MED ORDER — PNEUMOCOCCAL VAC POLYVALENT 25 MCG/0.5ML IJ INJ
0.5000 mL | INJECTION | INTRAMUSCULAR | Status: AC
  Administered 2018-02-03: 0.5 mL via INTRAMUSCULAR
  Filled 2018-02-02: qty 0.5

## 2018-02-02 MED ORDER — ALBUTEROL (5 MG/ML) CONTINUOUS INHALATION SOLN
10.0000 mg/h | INHALATION_SOLUTION | Freq: Once | RESPIRATORY_TRACT | Status: AC
  Administered 2018-02-02: 10 mg/h via RESPIRATORY_TRACT

## 2018-02-02 MED ORDER — ENOXAPARIN SODIUM 40 MG/0.4ML ~~LOC~~ SOLN
40.0000 mg | SUBCUTANEOUS | Status: DC
  Administered 2018-02-02: 40 mg via SUBCUTANEOUS
  Filled 2018-02-02: qty 0.4

## 2018-02-02 MED ORDER — SODIUM CHLORIDE 0.9% FLUSH: 3.0000 mL | INTRAVENOUS | Status: DC | PRN

## 2018-02-02 MED ORDER — HEPARIN BOLUS VIA INFUSION
3000.0000 [IU] | Freq: Once | INTRAVENOUS | Status: AC
  Administered 2018-02-02: 3000 [IU] via INTRAVENOUS

## 2018-02-02 MED ORDER — ALBUTEROL (5 MG/ML) CONTINUOUS INHALATION SOLN
INHALATION_SOLUTION | RESPIRATORY_TRACT | Status: AC
  Filled 2018-02-02: qty 20

## 2018-02-02 MED ORDER — ORAL CARE MOUTH RINSE
15.0000 mL | Freq: Two times a day (BID) | OROMUCOSAL | Status: DC
  Administered 2018-02-02 – 2018-02-05 (×5): 15 mL via OROMUCOSAL

## 2018-02-02 MED ORDER — NITROGLYCERIN 2 % TD OINT
1.0000 [in_us] | TOPICAL_OINTMENT | Freq: Four times a day (QID) | TRANSDERMAL | Status: DC
  Administered 2018-02-02: 1 [in_us] via TOPICAL
  Filled 2018-02-02: qty 1

## 2018-02-02 MED ORDER — FUROSEMIDE 10 MG/ML IJ SOLN
40.0000 mg | Freq: Once | INTRAMUSCULAR | Status: AC
  Administered 2018-02-02: 40 mg via INTRAVENOUS
  Filled 2018-02-02: qty 4

## 2018-02-02 MED ORDER — LABETALOL HCL 200 MG PO TABS
200.0000 mg | ORAL_TABLET | Freq: Two times a day (BID) | ORAL | Status: DC
  Administered 2018-02-02 – 2018-02-03 (×3): 200 mg via ORAL
  Filled 2018-02-02 (×3): qty 1

## 2018-02-02 MED ORDER — NITROGLYCERIN IN D5W 200-5 MCG/ML-% IV SOLN
5.0000 ug/min | Freq: Once | INTRAVENOUS | Status: AC
  Administered 2018-02-02: 5 ug/min via INTRAVENOUS
  Filled 2018-02-02: qty 250

## 2018-02-02 MED ORDER — ALBUTEROL SULFATE (2.5 MG/3ML) 0.083% IN NEBU: 5.0000 mg | INHALATION_SOLUTION | Freq: Once | RESPIRATORY_TRACT | Status: DC

## 2018-02-02 MED ORDER — PANTOPRAZOLE SODIUM 40 MG PO TBEC
40.0000 mg | DELAYED_RELEASE_TABLET | Freq: Every day | ORAL | Status: DC
  Administered 2018-02-02 – 2018-02-06 (×5): 40 mg via ORAL
  Filled 2018-02-02 (×6): qty 1

## 2018-02-02 MED ORDER — LISINOPRIL 10 MG PO TABS
20.0000 mg | ORAL_TABLET | Freq: Every day | ORAL | Status: DC
  Administered 2018-02-02: 20 mg via ORAL
  Filled 2018-02-02: qty 2

## 2018-02-02 MED ORDER — MAGNESIUM SULFATE 2 GM/50ML IV SOLN
2.0000 g | Freq: Once | INTRAVENOUS | Status: AC
  Administered 2018-02-02: 2 g via INTRAVENOUS
  Filled 2018-02-02: qty 50

## 2018-02-02 MED ORDER — HEPARIN (PORCINE) 25000 UT/250ML-% IV SOLN
1150.0000 [IU]/h | INTRAVENOUS | Status: DC
  Administered 2018-02-02 – 2018-02-04 (×2): 700 [IU]/h via INTRAVENOUS
  Administered 2018-02-05: 1150 [IU]/h via INTRAVENOUS
  Filled 2018-02-02 (×3): qty 250

## 2018-02-02 MED ORDER — SODIUM CHLORIDE 0.9 % IV SOLN: 250.0000 mL | INTRAVENOUS | Status: DC | PRN

## 2018-02-02 MED ORDER — INSULIN ASPART 100 UNIT/ML ~~LOC~~ SOLN
0.0000 [IU] | Freq: Three times a day (TID) | SUBCUTANEOUS | Status: DC
  Administered 2018-02-02: 3 [IU] via SUBCUTANEOUS
  Administered 2018-02-03: 2 [IU] via SUBCUTANEOUS
  Administered 2018-02-03: 5 [IU] via SUBCUTANEOUS
  Administered 2018-02-03 – 2018-02-04 (×3): 3 [IU] via SUBCUTANEOUS
  Administered 2018-02-04: 2 [IU] via SUBCUTANEOUS
  Administered 2018-02-05 (×2): 1 [IU] via SUBCUTANEOUS
  Administered 2018-02-05: 2 [IU] via SUBCUTANEOUS
  Administered 2018-02-06: 1 [IU] via SUBCUTANEOUS
  Administered 2018-02-06: 5 [IU] via SUBCUTANEOUS
  Administered 2018-02-06: 2 [IU] via SUBCUTANEOUS
  Filled 2018-02-02: qty 1

## 2018-02-02 MED ORDER — INSULIN ASPART 100 UNIT/ML ~~LOC~~ SOLN
0.0000 [IU] | Freq: Every day | SUBCUTANEOUS | Status: DC
  Administered 2018-02-03: 2 [IU] via SUBCUTANEOUS

## 2018-02-02 MED ORDER — ACETAMINOPHEN 325 MG PO TABS: 650.0000 mg | ORAL_TABLET | ORAL | Status: DC | PRN

## 2018-02-02 MED ORDER — EZETIMIBE 10 MG PO TABS
10.0000 mg | ORAL_TABLET | Freq: Every day | ORAL | Status: DC
  Administered 2018-02-02 – 2018-02-06 (×5): 10 mg via ORAL
  Filled 2018-02-02 (×8): qty 1

## 2018-02-02 MED ORDER — PAROXETINE HCL 20 MG PO TABS
20.0000 mg | ORAL_TABLET | ORAL | Status: DC
  Administered 2018-02-03 – 2018-02-06 (×4): 20 mg via ORAL
  Filled 2018-02-02 (×6): qty 1

## 2018-02-02 MED ORDER — ACETAMINOPHEN 325 MG PO TABS: 650.0000 mg | ORAL_TABLET | Freq: Once | ORAL | Status: DC | PRN

## 2018-02-02 MED ORDER — SODIUM CHLORIDE 0.9% FLUSH
3.0000 mL | Freq: Two times a day (BID) | INTRAVENOUS | Status: DC
  Administered 2018-02-02 – 2018-02-06 (×6): 3 mL via INTRAVENOUS

## 2018-02-02 MED ORDER — FENOFIBRATE 160 MG PO TABS
160.0000 mg | ORAL_TABLET | Freq: Every day | ORAL | Status: DC
  Administered 2018-02-02 – 2018-02-06 (×4): 160 mg via ORAL
  Filled 2018-02-02 (×7): qty 1

## 2018-02-02 MED ORDER — NITROGLYCERIN IN D5W 200-5 MCG/ML-% IV SOLN
0.0000 ug/min | INTRAVENOUS | Status: DC
  Administered 2018-02-02 – 2018-02-05 (×2): 25 ug/min via INTRAVENOUS
  Filled 2018-02-02 (×2): qty 250

## 2018-02-02 MED ORDER — INFLUENZA VAC SPLIT HIGH-DOSE 0.5 ML IM SUSY
0.5000 mL | PREFILLED_SYRINGE | INTRAMUSCULAR | Status: AC
  Administered 2018-02-03: 0.5 mL via INTRAMUSCULAR
  Filled 2018-02-02: qty 0.5

## 2018-02-02 MED ORDER — OXYCODONE HCL 5 MG PO TABS
5.0000 mg | ORAL_TABLET | Freq: Three times a day (TID) | ORAL | Status: DC
  Administered 2018-02-02 – 2018-02-06 (×13): 5 mg via ORAL
  Filled 2018-02-02 (×13): qty 1

## 2018-02-02 MED ORDER — IPRATROPIUM BROMIDE 0.02 % IN SOLN
0.5000 mg | Freq: Once | RESPIRATORY_TRACT | Status: AC
Start: 2018-02-02 — End: 2018-02-02
  Administered 2018-02-02: 0.5 mg via RESPIRATORY_TRACT

## 2018-02-02 NOTE — H&P (Signed)
History and Physical    Kristen Jones VOP:[id number] DOB: 04/16/1950 DOA: 02/02/2018  PCP: Lucia Gaskins, MD  Patient coming from: home  Chief Complaint:  SOB  HPI: Kristen Jones is a 68 y.o. female with medical history significant of 67 year old with past medical history relevant for depression/anxiety, hypertension, type 2 diabetes, asthma/COPD, hyperlipidemia who presents with acute hypoxic respiratory failure secondary to hypertensive emergency.  Patient reports she was doing fairly well until this morning when she began to have severe shortness of breath.  She did not have chest pain but did have chest tightness.  She did not have any nausea or vomiting or abdominal pain.  She noted both dyspnea on exertion and increased shortness of breath at rest.  She has not had any lower extreme any swelling orthopnea.  She has not been checking her blood pressures as she does not have a reliable blood pressure cuff.  Reports compliance with her antihypertensives.  She has not had any fevers, cough, congestion, rhinorrhea.  Patient tried a breathing treatment as she attributed initially her shortness of breath to COPD but it did not improve and so she called EMS.  Of note her blood sugars been running in the 100s.   ED Course: In the emergency department patient was noted to be hypertensive to the 638T systolic.  Chest x-ray showed asymmetric edema versus pneumonia.  Patient's vital signs were otherwise notable for mild hypoxia.  Due to work of breathing patient was placed on BiPAP.  She was started on a nitro drip for hypertensive emergency.  She was given 1 dose of IV furosemide.  Labs are notable for an elevated BNP and a troponin of 0.05.  EKG did not show any ischemic  Review of Systems: As per HPI otherwise 10 point review of systems negative.    Past Medical History:  Diagnosis Date  . Arthritis   . COPD (chronic obstructive pulmonary disease) ([place])   . Depression with anxiety  02/02/2018  . Diabetes mellitus   . High cholesterol   . Hypertension   . Mental disorder     Past Surgical History:  Procedure Laterality Date  . CHOLECYSTECTOMY    . KNEE SURGERY    . MULTIPLE EXTRACTIONS WITH ALVEOLOPLASTY  06/10/2011   Procedure: MULTIPLE EXTRACION WITH ALVEOLOPLASTY;  Surgeon: Gae Bon, DDS;  Location: [facility];  Service: Oral Surgery;  Laterality: Bilateral;  Extractions of one,six,eight,nine,eleven,twenty,twenty-one,twenty-two,twenty-four,twenty-five,twenty-six,twenty-seven,twenty-eight,twenty-nine     reports that she has quit smoking. She has never used smokeless tobacco. She reports that she does not drink alcohol or use drugs.  Allergies  Allergen Reactions  . Fluoxetine Hcl     REACTION: UNKNOWN REACTION  . Methocarbamol Nausea And Vomiting  . Metoclopramide Hcl     REACTION: UNKNOWN REACTION  . Motrin [Ibuprofen] Other (See Comments)    REACTION UNKNOWN  . Rabeprazole Sodium     REACTION: UNKNOWN REACTION    Family History  Problem Relation Age of Onset  . Anesthesia problems Neg Hx   . Hypotension Neg Hx   . Malignant hyperthermia Neg Hx   . Pseudochol deficiency Neg Hx      Prior to Admission medications   Medication Sig Start Date End Date Taking? Authorizing Provider  albuterol (PROVENTIL HFA;VENTOLIN HFA) 108 (90 Base) MCG/ACT inhaler Inhale 1 puff into the lungs every 6 (six) hours as needed for wheezing or shortness of breath.   Yes [provider]  dapagliflozin propanediol (FARXIGA) 10 MG TABS tablet Take 10  mg by mouth daily.   Yes [provider]  ezetimibe (ZETIA) 10 MG tablet Take 10 mg by mouth daily.    Yes [provider]  furosemide (LASIX) 20 MG tablet Take 20 mg by mouth daily.    Yes [provider]  labetalol (NORMODYNE) 200 MG tablet Take 200 mg by mouth 2 (two) times daily.   Yes [provider]  lisinopril (PRINIVIL,ZESTRIL) 20 MG tablet Take 20 mg by mouth daily.   Yes  [provider]  metFORMIN (GLUCOPHAGE) 500 MG tablet Take by mouth 2 (two) times daily with a meal.   Yes [provider]  naproxen (NAPROSYN) 500 MG tablet Take 500 mg by mouth 2 (two) times daily with a meal.   Yes [provider]  oxyCODONE (OXY IR/ROXICODONE) 5 MG immediate release tablet Take 5 mg by mouth 3 (three) times daily.   Yes [provider]  pantoprazole (PROTONIX) 40 MG tablet Take 40 mg by mouth daily.   Yes [provider]  PARoxetine (PAXIL) 20 MG tablet Take 20 mg by mouth every morning.    Yes [provider]  albuterol-ipratropium (COMBIVENT) 18-103 MCG/ACT inhaler Inhale 2 puffs into the lungs every 6 (six) hours as needed for wheezing.    [provider]  benazepril (LOTENSIN) 10 MG tablet Take 10 mg by mouth daily.    [provider]  cephALEXin (KEFLEX) 500 MG capsule Take 1 capsule (500 mg total) by mouth 4 (four) times daily. Patient not taking: Reported on 11/18/2016 06/22/16   Lily Kocher, PA-C  fenofibrate 160 MG tablet Take 160 mg by mouth daily.     [provider]  potassium chloride (K-DUR) 10 MEQ tablet Take 1 tablet by mouth daily. 10/09/12   [provider]  promethazine (PHENERGAN) 25 MG suppository Place 1 suppository (25 mg total) rectally every 6 (six) hours as needed for nausea or vomiting. Patient not taking: Reported on 11/18/2016 06/22/16   Lily Kocher, PA-C  traMADol Veatrice Bourbon) 50 MG tablet 1 or 2 po q6h prn pain Patient not taking: Reported on 11/18/2016 06/22/16   Lily Kocher, PA-C    Physical Exam: Vitals:   02/02/18 1030 02/02/18 1045 02/02/18 1100 02/02/18 1115  BP: 130/68 138/78 137/76 132/76  Pulse: (!) 102 (!) 102 100 99  Resp: (!) 23 19 (!) 21 19  Temp:      SpO2: 98% 99% 99% 99%  Weight:      Height:        Constitutional: NAD, calm, comfortable Vitals:   02/02/18 1030 02/02/18 1045 02/02/18 1100 02/02/18 1115  BP: 130/68 138/78 137/76  132/76  Pulse: (!) 102 (!) 102 100 99  Resp: (!) 23 19 (!) 21 19  Temp:      SpO2: 98% 99% 99% 99%  Weight:      Height:       Eyes: Anicteric sclera ENMT: Moist mucous membranes, edentulous Neck: normal, supple Respiratory: No increased work of breathing, bibasilar crackles worse on right, no wheezes or rhonchi.  Cardiovascular: Distant heart sounds, regular rate and rhythm, no murmurs Abdomen: no tenderness, no masses palpated. No hepatosplenomegaly. Bowel sounds positive.  Musculoskeletal: No lower extremity edema Skin: no rashes over visible skin Neurologic: Grossly intact, moving all extremities Psychiatric: Normal judgment and insight. Alert and oriented x 3. Normal mood.     Labs on Admission: I have personally reviewed following labs and imaging studies  CBC: Recent Labs  Lab 02/02/18 0721  WBC  11.9*  HGB 11.6*  HCT 37.9  MCV 94.5  PLT 811   Basic Metabolic Panel: Recent Labs  Lab 02/02/18 0721  NA 139  K 4.8  CL 107  CO2 25  GLUCOSE 238*  BUN 18  CREATININE 1.20*  CALCIUM 8.9   GFR: Estimated Creatinine Clearance: 36.8 mL/min (A) (by C-G formula based on SCr of 1.2 mg/dL (H)). Liver Function Tests: No results for input(s): AST, ALT, ALKPHOS, BILITOT, PROT, ALBUMIN in the last 168 hours. No results for input(s): LIPASE, AMYLASE in the last 168 hours. No results for input(s): AMMONIA in the last 168 hours. Coagulation Profile: No results for input(s): INR, PROTIME in the last 168 hours. Cardiac Enzymes: Recent Labs  Lab 02/02/18 0721  TROPONINI 0.05*   BNP (last 3 results) No results for input(s): PROBNP in the last 8760 hours. HbA1C: No results for input(s): HGBA1C in the last 72 hours. CBG: No results for input(s): GLUCAP in the last 168 hours. Lipid Profile: No results for input(s): CHOL, HDL, LDLCALC, TRIG, CHOLHDL, LDLDIRECT in the last 72 hours. Thyroid Function Tests: Recent Labs    02/02/18 0721  TSH 3.143   Anemia Panel: No  results for input(s): VITAMINB12, FOLATE, FERRITIN, TIBC, IRON, RETICCTPCT in the last 72 hours. Urine analysis:    Component Value Date/Time   COLORURINE YELLOW 06/22/2016 1200   APPEARANCEUR CLOUDY (A) 06/22/2016 1200   LABSPEC 1.016 06/22/2016 1200   PHURINE 5.0 06/22/2016 1200   GLUCOSEU 50 (A) 06/22/2016 1200   HGBUR SMALL (A) 06/22/2016 1200   BILIRUBINUR NEGATIVE 06/22/2016 1200   KETONESUR 5 (A) 06/22/2016 1200   PROTEINUR NEGATIVE 06/22/2016 1200   UROBILINOGEN 0.2 12/09/2009 0202   NITRITE POSITIVE (A) 06/22/2016 1200   LEUKOCYTESUR LARGE (A) 06/22/2016 1200    Radiological Exams on Admission: Dg Chest Portable 1 View  Result Date: 02/02/2018 CLINICAL DATA:  Productive cough, wheezing, shortness of breath. History of asthma-COPD, former smoker. EXAM: PORTABLE CHEST 1 VIEW COMPARISON:  PA and lateral chest x-ray of December 03, 2012 FINDINGS: The right lung is well-expanded. The interstitial markings are minimally prominent. On the left the interstitial markings are increased diffusely. The lung is adequately inflated. No alveolar infiltrate is observed. The cardiac silhouette is enlarged and the pulmonary vascularity is mildly engorged. There is calcification in the wall of the aortic arch. The bony thorax exhibits no acute abnormality. IMPRESSION: The findings are worrisome for asymmetric pulmonary edema or interstitial pneumonia greatest on the left. Correlation with clinical and laboratory values is needed. If pneumonia is felt most likely on clinical grounds, followup PA and lateral chest X-ray is recommended in 3-4 weeks following trial of antibiotic therapy to ensure resolution and exclude underlying malignancy. Electronically Signed   By: David  Martinique M.D.   On: 02/02/2018 07:30    EKG: Independently reviewed.  Sinus rhythm, no acute ST segment changes, very poor baseline, relatively unchanged from prior  Assessment/Plan Principal Problem:   Flash pulmonary edema  (HCC) Active Problems:   T2DM (type 2 diabetes mellitus) (HCC)   HLD (hyperlipidemia)   Essential hypertension   Asthma   GASTROESOPHAGEAL REFLUX DISEASE, CHRONIC   Depression with anxiety   Hypertensive emergency    #) Acute hypoxic respiratory failure due to hypertensive emergency: Suspect patient most likely had acute heart failure due to hypertensive emergency.  She does have some asymmetric edema on chest x-ray but no other signs or symptoms of a pneumonia.  She does not appear to be grossly fluid  overloaded suggesting that this is likely in the setting of uncontrolled hypertension.  Have low suspicion for ischemia or acute systolic heart failure. -IV furosemide 40 mill grams twice daily -Hold home oral furosemide next line-echo ordered -Cycle troponins - Continue nitro drip, cross titrate with home antihypertensives -Start home labetalol 200 mg twice daily, lisinopril 20 mg daily, will uptitrate as nitro drip was decreased -We will order procalcitonin to rule out pneumonia  #) Type 2 diabetes: -Hold dapagliflozin 10 mg daily -Hold metformin 500 mg twice daily -Sliding scale insulin, AC at bedtime  #) Asthma/COPD: -Continue PRN bronchodilators -We will hold on steroids as this does not seem likely to be a asthma exacerbation.  #) Hyperlipidemia: -Continue Zetia 10 mg daily -Continue fenofibrate 160 mg daily  #) Pain/psych: -Continue paroxetine 20 mg daily -Continue PRN oxycodone  Fluids: Restricted Electrolytes: Monitor and supplement Nutrition: Heart healthy/carb restricted diet  Prophylaxis: Enoxaparin  Disposition: Pending resolution of pulmonary edema and control of hypertension  Full code  Cristy Folks MD [facility]  If 7PM-7AM, please contact night-coverage [email or web address] Password [facility]  02/02/2018, 12:25 PM

## 2018-02-02 NOTE — Progress Notes (Signed)
Night shift ICU coverage note.   The case was discussed with cardiology on call Dr. Emilio Aspen who agreed with aspirin and heparin treatment. She is also on NTG infusion. She has known T-wave changes on her EKG. We will continue treatment for know, continue heparin and have cardiology at [facility] see her in AM. She might still need to be transferred tomorrow, but will continue medical therapy for now.   Tennis Must, MD

## 2018-02-02 NOTE — ED Triage Notes (Signed)
Pt c/o sob and wheezing with cough that is productive with no relief from her inhaler, pt given breathing treatment by ems with little improvement of symptoms, room air pulse ox was upper 80:s on RA,

## 2018-02-02 NOTE — ED Notes (Addendum)
Note entered in error

## 2018-02-02 NOTE — Progress Notes (Signed)
ANTICOAGULATION CONSULT NOTE - Initial Consult  Pharmacy Consult for heparin dosing Indication: chest pain/ACS  Allergies  Allergen Reactions  . Fluoxetine Hcl     REACTION: UNKNOWN REACTION  . Methocarbamol Nausea And Vomiting  . Metoclopramide Hcl     REACTION: UNKNOWN REACTION  . Motrin [Ibuprofen] Other (See Comments)    REACTION UNKNOWN  . Rabeprazole Sodium     REACTION: UNKNOWN REACTION    Patient Measurements: Height: 4\' 10"  (147.3 cm) Weight: 140 lb (63.5 kg) IBW/kg (Calculated) : 40.9 Heparin Dosing Weight:  HEPARIN DW (KG): 54.8    Vital Signs: BP: 133/69 (11/11 1945) Pulse Rate: 78 (11/11 1945)  Labs: Recent Labs    02/02/18 0721 02/02/18 1306 02/02/18 1853  HGB 11.6*  --   --   HCT 37.9  --   --   PLT 232  --   --   CREATININE 1.20*  --   --   TROPONINI 0.05* 0.79* 1.13*    Estimated Creatinine Clearance: 36.8 mL/min (A) (by C-G formula based on SCr of 1.2 mg/dL (H)).   Medical History: Past Medical History:  Diagnosis Date  . Arthritis   . COPD (chronic obstructive pulmonary disease) ([place])   . Depression with anxiety 02/02/2018  . Diabetes mellitus   . High cholesterol   . Hypertension   . Mental disorder    Assessment:   Pharmacy consulted to dose heparin for this 89 yof with chest pain/ACS.   Troponins are rising.   Goal of Therapy:  Heparin level 0.3-0.7 units/ml Monitor platelets by anticoagulation protocol: Yes   Plan:  Give 3000 units bolus x 1 Start heparin infusion at 700 units/hr Check anti-Xa level in 6-8 hours and daily while on heparin Continue to monitor H&H and platelets  Despina Pole 02/02/2018,8:29 PM

## 2018-02-02 NOTE — ED Notes (Signed)
Dr. Tomi Bamberger gave RN verbal order to stop titrating Nitro drip at this time and continue with the rate that we are at currently.

## 2018-02-02 NOTE — ED Provider Notes (Signed)
[facility] EMERGENCY DEPARTMENT Provider Note   CSN: [id number] Arrival date & time: 02/02/18  0645     History   Chief Complaint Chief Complaint  Patient presents with  . Shortness of Breath    HPI Kristen Jones is a 67 y.o. female.  HPI Patient presents to the emergency room for evaluation of shortness of breath.  Patient states the symptoms started suddenly this morning.  She woke up feeling short of breath.  Yesterday she was feeling fine.  Patient has a history of COPD.  She is a former smoker.  When she started to feel short of breath this morning she tried her inhaler but did not feel any better.  She had to call 911.  When EMS arrived they noticed that she was wheezing and they gave her a breathing treatment.  Patient denies any trouble with chest pain.  She denies any trouble with leg swelling.  She denies any fevers or chills.  Patient does feel sweaty.  She says this is very common for her and not just associated with this illness.  She often sweats today at night.  Patient is not sure if she is had fevers.  She has asked her doctor about this and was told that everything was okay. Past Medical History:  Diagnosis Date  . Arthritis   . COPD (chronic obstructive pulmonary disease) ([place])   . Diabetes mellitus   . High cholesterol   . Hypertension   . Mental disorder     Patient Active Problem List   Diagnosis Date Noted  . Encounter for routine gynecological examination with Papanicolaou smear of cervix 11/18/2016  . Fracture of metacarpal base of right hand, closed 07/24/2011  . High cholesterol   . ANKLE SPRAIN 11/01/2008  . FOOT SPRAIN, LEFT 11/01/2008  . HIGH BLOOD PRESSURE 11/01/2008  . Type II or unspecified type diabetes mellitus without mention of complication, not stated as uncontrolled 12/09/2007  . HYPERLIPIDEMIA 12/09/2007  . HYPERTENSION 12/09/2007  . ASTHMA 12/09/2007  . GASTROESOPHAGEAL REFLUX DISEASE, CHRONIC 12/09/2007  . IRRITABLE BOWEL  SYNDROME 12/09/2007  . SCOLIOSIS 12/09/2007  . ABDOMINAL BLOATING 12/09/2007  . ABDOMINAL PAIN 12/09/2007  . CARPAL TUNNEL RELEASE, HX OF 12/09/2007    Past Surgical History:  Procedure Laterality Date  . CHOLECYSTECTOMY    . KNEE SURGERY    . MULTIPLE EXTRACTIONS WITH ALVEOLOPLASTY  06/10/2011   Procedure: MULTIPLE EXTRACION WITH ALVEOLOPLASTY;  Surgeon: Gae Bon, DDS;  Location: [facility];  Service: Oral Surgery;  Laterality: Bilateral;  Extractions of one,six,eight,nine,eleven,twenty,twenty-one,twenty-two,twenty-four,twenty-five,twenty-six,twenty-seven,twenty-eight,twenty-nine     OB History    Gravida  1   Para  1   Term      Preterm      AB      Living  1     SAB      TAB      Ectopic      Multiple      Live Births               Home Medications    Prior to Admission medications   Medication Sig Start Date End Date Taking? Authorizing Provider  albuterol-ipratropium (COMBIVENT) 18-103 MCG/ACT inhaler Inhale 2 puffs into the lungs every 6 (six) hours as needed for wheezing.    [provider]  benazepril (LOTENSIN) 10 MG tablet Take 10 mg by mouth daily.    [provider]  cephALEXin (KEFLEX) 500 MG capsule Take 1 capsule (500 mg total) by mouth  4 (four) times daily. Patient not taking: Reported on 11/18/2016 06/22/16   Lily Kocher, PA-C  ezetimibe (ZETIA) 10 MG tablet Take 10 mg by mouth daily.     [provider]  fenofibrate 160 MG tablet Take 160 mg by mouth daily.     [provider]  furosemide (LASIX) 20 MG tablet Take 20 mg by mouth daily.     [provider]  glucose blood test strip 1 each by Other route as needed for other. Use as instructed    [provider]  glyBURIDE-metformin (GLUCOVANCE) 2.5-500 MG per tablet Take 1 tablet by mouth 2 (two) times daily with a meal.     [provider]  HYDROcodone-acetaminophen (NORCO/VICODIN) 5-325 MG tablet Take one tab po q 4-6 hrs prn  pain Patient not taking: Reported on 11/18/2016 07/09/15   Triplett, Tammy, PA-C  omeprazole (PRILOSEC) 20 MG capsule Take 20 mg by mouth daily.     [provider]  oxyCODONE-acetaminophen (PERCOCET/ROXICET) 5-325 MG per tablet Take 1 tablet by mouth every 6 (six) hours as needed for pain.  11/12/12   [provider]  PARoxetine (PAXIL) 20 MG tablet Take 20 mg by mouth every morning.     [provider]  potassium chloride (K-DUR) 10 MEQ tablet Take 1 tablet by mouth daily. 10/09/12   [provider]  PROAIR HFA 108 (90 BASE) MCG/ACT inhaler Inhale 2 puffs into the lungs every 6 (six) hours as needed. 11/25/12   [provider]  promethazine (PHENERGAN) 25 MG suppository Place 1 suppository (25 mg total) rectally every 6 (six) hours as needed for nausea or vomiting. Patient not taking: Reported on 11/18/2016 06/22/16   Lily Kocher, PA-C  traMADol Veatrice Bourbon) 50 MG tablet 1 or 2 po q6h prn pain Patient not taking: Reported on 11/18/2016 06/22/16   Lily Kocher, PA-C    Family History Family History  Problem Relation Age of Onset  . Anesthesia problems Neg Hx   . Hypotension Neg Hx   . Malignant hyperthermia Neg Hx   . Pseudochol deficiency Neg Hx     Social History Social History   Tobacco Use  . Smoking status: Former Research scientist (life sciences)  . Smokeless tobacco: Never Used  Substance Use Topics  . Alcohol use: No  . Drug use: No     Allergies   Fluoxetine hcl; Methocarbamol; Metoclopramide hcl; Motrin [ibuprofen]; and Rabeprazole sodium   Review of Systems Review of Systems  All other systems reviewed and are negative.    Physical Exam Updated Vital Signs BP (!) 169/107   Pulse (!) 114   Temp 98.1 F (36.7 C)   Resp (!) 26   Ht 1.473 m (4\' 10" )   Wt 63.5 kg   SpO2 95%   BMI 29.26 kg/m   Physical Exam  Constitutional:  Non-toxic appearance.  HENT:  Head: Normocephalic and atraumatic.  Right Ear: External ear normal.  Left Ear:  External ear normal.  Eyes: Conjunctivae are normal. Right eye exhibits no discharge. Left eye exhibits no discharge. No scleral icterus.  Neck: Neck supple. No tracheal deviation present.  Cardiovascular: Normal rate, regular rhythm and intact distal pulses.  Pulmonary/Chest: Effort normal and breath sounds normal. No stridor. No respiratory distress. She has no wheezes. She has no rales.  Abdominal: Soft. Bowel sounds are normal. She exhibits no distension. There is no tenderness. There is no rebound and no guarding.  Musculoskeletal: She exhibits no edema or tenderness.  Neurological: She is alert.  She has normal strength. No cranial nerve deficit (no facial droop, extraocular movements intact, no slurred speech) or sensory deficit. She exhibits normal muscle tone. She displays no seizure activity. Coordination normal.  Skin: Skin is warm. No rash noted. She is diaphoretic.  Psychiatric: She has a normal mood and affect.  Nursing note and vitals reviewed.    ED Treatments / Results  Labs (all labs ordered are listed, but only abnormal results are displayed) Labs Reviewed  BASIC METABOLIC PANEL - Abnormal; Notable for the following components:      Result Value   Glucose, Bld 238 (*)    Creatinine, Ser 1.20 (*)    GFR calc non Af Amer 46 (*)    GFR calc Af Amer 54 (*)    All other components within normal limits  CBC - Abnormal; Notable for the following components:   WBC 11.9 (*)    Hemoglobin 11.6 (*)    All other components within normal limits  BRAIN NATRIURETIC PEPTIDE - Abnormal; Notable for the following components:   B Natriuretic Peptide 579.0 (*)    All other components within normal limits  TROPONIN I - Abnormal; Notable for the following components:   Troponin I 0.05 (*)    All other components within normal limits  TSH  I-STAT TROPONIN, ED    EKG EKG Interpretation  Date/Time:  Monday February 02 2018 07:31:06 EST Ventricular Rate:  126 PR Interval:    QRS  Duration: 79 QT Interval:  295 QTC Calculation: 427 R Axis:   62 Text Interpretation:  Sinus tachycardia Nonspecific T abnormalities, lateral leads No significant change since last tracing Confirmed by Dorie Rank [phone]) on 02/02/2018 7:40:45 AM   Radiology Dg Chest Portable 1 View  Result Date: 02/02/2018 CLINICAL DATA:  Productive cough, wheezing, shortness of breath. History of asthma-COPD, former smoker. EXAM: PORTABLE CHEST 1 VIEW COMPARISON:  PA and lateral chest x-ray of December 03, 2012 FINDINGS: The right lung is well-expanded. The interstitial markings are minimally prominent. On the left the interstitial markings are increased diffusely. The lung is adequately inflated. No alveolar infiltrate is observed. The cardiac silhouette is enlarged and the pulmonary vascularity is mildly engorged. There is calcification in the wall of the aortic arch. The bony thorax exhibits no acute abnormality. IMPRESSION: The findings are worrisome for asymmetric pulmonary edema or interstitial pneumonia greatest on the left. Correlation with clinical and laboratory values is needed. If pneumonia is felt most likely on clinical grounds, followup PA and lateral chest X-ray is recommended in 3-4 weeks following trial of antibiotic therapy to ensure resolution and exclude underlying malignancy. Electronically Signed   By: David  Martinique M.D.   On: 02/02/2018 07:30    Procedures .Critical Care Performed by: Dorie Rank, MD Authorized by: Dorie Rank, MD   Critical care provider statement:    Critical care time (minutes):  35   Critical care was time spent personally by me on the following activities:  Discussions with consultants, evaluation of patient's response to treatment, examination of patient, ordering and performing treatments and interventions, ordering and review of laboratory studies, ordering and review of radiographic studies, pulse oximetry, re-evaluation of patient's condition, obtaining history  from patient or surrogate and review of old charts   (including critical care time)  Medications Ordered in ED Medications  ipratropium (ATROVENT) 0.02 % nebulizer solution (  Canceled Entry 02/02/18 0700)  albuterol (PROVENTIL, VENTOLIN) (5 MG/ML) 0.5% continuous inhalation solution (  Canceled Entry 02/02/18 0700)  acetaminophen (  TYLENOL) tablet 650 mg (has no administration in time range)  aspirin chewable tablet 324 mg (has no administration in time range)  ipratropium (ATROVENT) nebulizer solution 0.5 mg (0.5 mg Nebulization Given 02/02/18 0659)  albuterol (PROVENTIL,VENTOLIN) solution continuous neb (10 mg/hr Nebulization Given 02/02/18 0659)  furosemide (LASIX) injection 40 mg (40 mg Intravenous Given 02/02/18 0805)  nitroGLYCERIN 50 mg in dextrose 5 % 250 mL (0.2 mg/mL) infusion (35 mcg/min Intravenous Rate/Dose Change 02/02/18 0844)     Initial Impression / Assessment and Plan / ED Course  I have reviewed the triage vital signs and the nursing notes.  Pertinent labs & imaging results that were available during my care of the patient were reviewed by me and considered in my medical decision making (see chart for details).  Clinical Course as of Feb 03 852  [facility] Feb 02, 2018  0750 Patient's chest x-ray is suggestive of congestive heart failure exacerbation.  Patient remains hypertensive and tachycardic.  I have ordered diuretics and will start on a nitroglycerin drip   [JK]  0825 Pressures improving with nitroglycerin drip.  Patient still has increased work of breathing.  I will start her on BiPAP   [JK]    Clinical Course User Index [JK] Dorie Rank, MD    Patient presented to the emergency room for evaluation of acute shortness of breath.  Patient thought she was having a COPD exacerbation.  Her ED work-up suggests CHF exacerbation.  Patient was noted to be significantly hypertensive and diaphoretic.  She denied any chest pain.  Chest x-ray shows pulmonary edema and she has  an elevated BNP.  Patient was started on nitroglycerin drip and her blood pressure is improving.  She was also given a dose of diuretics.  Patient continues to still have some tachypnea although she was feeling better.  BiPAP was ordered to decrease her work of breathing.  Plan on admission to the hospital for further treatment and evaluation.  Final Clinical Impressions(s) / ED Diagnoses   Final diagnoses:  Acute congestive heart failure, unspecified heart failure type ([place])  Hypertension, unspecified type      Dorie Rank, MD 02/02/18 [phone]

## 2018-02-02 NOTE — ED Notes (Signed)
CRITICAL VALUE ALERT  Critical Value:  Troponin 0.05  Date & Time Notied:  02/02/18 0817  Provider Notified: Dr. Tomi Bamberger  Orders Received/Actions taken: EDP notified, no further orders given

## 2018-02-02 NOTE — ED Notes (Signed)
CRITICAL VALUE ALERT  Critical Value:  Troponin - 0.79  Date & Time Notied:  02/02/18   1408  Provider Notified: Dr Herbert Moors  Orders Received/Actions taken: No further orders at this time.

## 2018-02-02 NOTE — Progress Notes (Signed)
*  PRELIMINARY RESULTS* Echocardiogram 2D Echocardiogram has been performed.  Kristen Jones 02/02/2018, 4:43 PM

## 2018-02-02 NOTE — ED Notes (Signed)
Date and time results received: 02/02/18 0815 (use smartphrase ".now" to insert current time)  Test: trop Critical Value: 0.05  Name of Provider Notified: john knapp  Orders Received? Or Actions Taken?: see chart

## 2018-02-02 NOTE — ED Notes (Signed)
Nitroglycerin ointment removed at this time per MD order to discontinue. Unable to document on MAR.

## 2018-02-02 NOTE — ED Notes (Signed)
Date and time results received: 02/02/18 8:09 PM  (use smartphrase ".now" to insert current time)  Test: trop Critical Value: 1.13  Name of Provider Notified: Olevia Bowens  Orders Received? Or Actions Taken?: see orders

## 2018-02-03 ENCOUNTER — Inpatient Hospital Stay (HOSPITAL_COMMUNITY): Payer: Medicare Other

## 2018-02-03 DIAGNOSIS — I10 Essential (primary) hypertension: Secondary | ICD-10-CM

## 2018-02-03 DIAGNOSIS — J810 Acute pulmonary edema: Secondary | ICD-10-CM

## 2018-02-03 DIAGNOSIS — I214 Non-ST elevation (NSTEMI) myocardial infarction: Secondary | ICD-10-CM

## 2018-02-03 DIAGNOSIS — N179 Acute kidney failure, unspecified: Secondary | ICD-10-CM

## 2018-02-03 DIAGNOSIS — I161 Hypertensive emergency: Secondary | ICD-10-CM

## 2018-02-03 DIAGNOSIS — R9431 Abnormal electrocardiogram [ECG] [EKG]: Secondary | ICD-10-CM

## 2018-02-03 DIAGNOSIS — E119 Type 2 diabetes mellitus without complications: Secondary | ICD-10-CM

## 2018-02-03 DIAGNOSIS — F418 Other specified anxiety disorders: Secondary | ICD-10-CM

## 2018-02-03 DIAGNOSIS — K219 Gastro-esophageal reflux disease without esophagitis: Secondary | ICD-10-CM

## 2018-02-03 DIAGNOSIS — E785 Hyperlipidemia, unspecified: Secondary | ICD-10-CM

## 2018-02-03 LAB — BASIC METABOLIC PANEL WITH GFR
BUN: 28 mg/dL — ABNORMAL HIGH (ref 8–23)
Chloride: 101 mmol/L (ref 98–111)
Creatinine, Ser: 1.88 mg/dL — ABNORMAL HIGH (ref 0.44–1.00)
Glucose, Bld: 209 mg/dL — ABNORMAL HIGH (ref 70–99)
Sodium: 136 mmol/L (ref 135–145)

## 2018-02-03 LAB — BASIC METABOLIC PANEL
Anion gap: 9 (ref 5–15)
CO2: 26 mmol/L (ref 22–32)
Calcium: 8.8 mg/dL — ABNORMAL LOW (ref 8.9–10.3)
GFR calc Af Amer: 31 mL/min — ABNORMAL LOW (ref >60)
GFR calc non Af Amer: 27 mL/min — ABNORMAL LOW (ref >60)
Potassium: 4.2 mmol/L (ref 3.5–5.1)

## 2018-02-03 LAB — CBC
HCT: 32.2 % — ABNORMAL LOW (ref 36.0–46.0)
Hemoglobin: 10 g/dL — ABNORMAL LOW (ref 12.0–15.0)
MCH: 28.1 pg (ref 26.0–34.0)
MCHC: 31.1 g/dL (ref 30.0–36.0)
MCV: 90.4 fL (ref 80.0–100.0)
Platelets: 176 10*3/uL (ref 150–400)
RBC: 3.56 MIL/uL — ABNORMAL LOW (ref 3.87–5.11)
RDW: 14.9 % (ref 11.5–15.5)
WBC: 6.4 K/uL (ref 4.0–10.5)
nRBC: 0 % (ref 0.0–0.2)

## 2018-02-03 LAB — HIV ANTIBODY (ROUTINE TESTING W REFLEX): HIV Screen 4th Generation wRfx: NONREACTIVE

## 2018-02-03 LAB — LIPID PANEL
Cholesterol: 99 mg/dL (ref 0–200)
HDL: 29 mg/dL — ABNORMAL LOW (ref >40)
LDL CALC: 40 mg/dL (ref 0–99)
TRIGLYCERIDES: 151 mg/dL — AB (ref <150)
Total CHOL/HDL Ratio: 3.4 RATIO
VLDL: 30 mg/dL (ref 0–40)

## 2018-02-03 LAB — HEPARIN LEVEL (UNFRACTIONATED): Heparin Unfractionated: 0.42 [IU]/mL (ref 0.30–0.70)

## 2018-02-03 LAB — MAGNESIUM: Magnesium: 2.2 mg/dL (ref 1.7–2.4)

## 2018-02-03 LAB — GLUCOSE, CAPILLARY
Glucose-Capillary: 191 mg/dL — ABNORMAL HIGH (ref 70–99)
Glucose-Capillary: 228 mg/dL — ABNORMAL HIGH (ref 70–99)
Glucose-Capillary: 257 mg/dL — ABNORMAL HIGH (ref 70–99)
Glucose-Capillary: 223 mg/dL — ABNORMAL HIGH (ref 70–99)

## 2018-02-03 LAB — TROPONIN I: Troponin I: 0.67 ng/mL (ref <0.03)

## 2018-02-03 LAB — HEMOGLOBIN A1C
HEMOGLOBIN A1C: 7.6 % — AB (ref 4.8–5.6)
Mean Plasma Glucose: 171.42 mg/dL

## 2018-02-03 LAB — MRSA PCR SCREENING: MRSA by PCR: POSITIVE — AB

## 2018-02-03 LAB — PROCALCITONIN: Procalcitonin: 0.95 ng/mL

## 2018-02-03 MED ORDER — CARVEDILOL 6.25 MG PO TABS
6.2500 mg | ORAL_TABLET | Freq: Two times a day (BID) | ORAL | Status: DC
  Administered 2018-02-03 – 2018-02-05 (×4): 6.25 mg via ORAL
  Filled 2018-02-03 (×2): qty 2
  Filled 2018-02-03: qty 1
  Filled 2018-02-03: qty 2

## 2018-02-03 MED ORDER — MUPIROCIN 2 % EX OINT
1.0000 "application " | TOPICAL_OINTMENT | Freq: Two times a day (BID) | CUTANEOUS | Status: DC
  Administered 2018-02-04 – 2018-02-06 (×5): 1 via NASAL
  Filled 2018-02-03 (×3): qty 22

## 2018-02-03 MED ORDER — ROSUVASTATIN CALCIUM 10 MG PO TABS
10.0000 mg | ORAL_TABLET | Freq: Every day | ORAL | Status: DC
  Administered 2018-02-03 – 2018-02-06 (×4): 10 mg via ORAL
  Filled 2018-02-03 (×4): qty 1

## 2018-02-03 MED ORDER — SODIUM CHLORIDE 0.45 % IV SOLN
INTRAVENOUS | Status: DC
  Administered 2018-02-03 – 2018-02-04 (×3): via INTRAVENOUS

## 2018-02-03 MED ORDER — ROSUVASTATIN CALCIUM 20 MG PO TABS: 20.0000 mg | ORAL_TABLET | Freq: Every day | ORAL | Status: DC

## 2018-02-03 MED ORDER — ASPIRIN EC 81 MG PO TBEC
81.0000 mg | DELAYED_RELEASE_TABLET | Freq: Every day | ORAL | Status: DC
  Administered 2018-02-03 – 2018-02-06 (×3): 81 mg via ORAL
  Filled 2018-02-03 (×4): qty 1

## 2018-02-03 MED ORDER — FUROSEMIDE 10 MG/ML IJ SOLN
40.0000 mg | Freq: Every day | INTRAMUSCULAR | Status: DC
  Administered 2018-02-03: 40 mg via INTRAVENOUS
  Filled 2018-02-03: qty 4

## 2018-02-03 MED ORDER — CHLORHEXIDINE GLUCONATE CLOTH 2 % EX PADS
6.0000 | MEDICATED_PAD | Freq: Every day | CUTANEOUS | Status: DC
  Administered 2018-02-03 – 2018-02-06 (×4): 6 via TOPICAL

## 2018-02-03 NOTE — Progress Notes (Signed)
24 hour urine started 02/03/18 at noon 1200

## 2018-02-03 NOTE — Progress Notes (Addendum)
PROGRESS NOTE    Kristen Jones  UUV:[id number]  DOB: [birth year]  DOA: 02/02/2018 PCP: Lucia Gaskins, MD   Brief Admission Hx:  67 y.o. female with medical history significant of 67 year old with past medical history relevant for depression/anxiety, hypertension, type 2 diabetes, asthma/COPD, hyperlipidemia who presents with acute hypoxic respiratory failure secondary to hypertensive emergency.  MDM/Assessment & Plan:   1. Hypertensive emergency - Pt has responded very well to IV nitroglycerin and blood pressures are coming down.  She has been restarted on her home BP meds for longstanding chronic hypertension.  Nephrology team assisting with management of the hypertension.   2. Flash pulmonary edema - Pt has diuresed 1.5 liters since admission and feeling a lot better.   3. Elevated troponin - suspect demand ischemia from hypertensive emergency and pulmonary edema.  Cardiology team has been consulted.  Pt is on aspirin/heparin at this time.  4. Type 2 DM - pt is covered with sliding scale and holding home oral medications for now.  5. Hyperlipidemia - resumed home medications.  6. Depression - resumed home paroxetine.    DVT prophylaxis: heparin Code Status: Full  Family Communication: patient updated at bedside Disposition Plan: ICU pending subspecialty consultation  Procedures:  Echocardiogram Study Conclusions  - Left ventricle: The cavity size was normal. Systolic function was  mildly reduced. The estimated ejection fraction was 45%. Features   are consistent with a pseudonormal left ventricular filling pattern, with concomitant abnormal relaxation and increased   filling pressure (grade 2 diastolic dysfunction). Doppler parameters are consistent with high ventricular filling pressure.   Mild to moderate concentric LVH. - Regional wall motion abnormality: Hypokinesis of the apical septal, apical lateral, and apical myocardium. - Aortic valve: Moderately calcified  annulus. Trileaflet. There was mild regurgitation. - Mitral valve: Mildly calcified annulus. There was mild regurgitation. - Left atrium: The atrium was severely dilated. - Atrial septum: No defect or patent foramen ovale was identified. - Systemic veins: The IVC is dilated with normal respiratory variation. Estimated right atrial pressure is 8 mmHg.  Consultants:  Nephrology  Cardiology   Subjective: Pt says that she is feeling a lot better this morning.  No CP.  No SOB.    Objective: Vitals:   02/03/18 0655 02/03/18 0656 02/03/18 0700 02/03/18 0815  BP:   (!) 150/74   Pulse: 74 76 73   Resp: 19 19 17    Temp:      TempSrc:    (P) Oral  SpO2: 95% 95% 94%   Weight:      Height:        Intake/Output Summary (Last 24 hours) at 02/03/2018 0847 Last data filed at 02/03/2018 4742 Gross per 24 hour  Intake 1614.18 ml  Output 2820 ml  Net -1205.82 ml   Filed Weights   02/02/18 2108 02/03/18 0500 02/03/18 5956  Weight: 58.6 kg 58.6 kg 58.4 kg   REVIEW OF SYSTEMS  As per history otherwise all reviewed and reported negative  Exam:  General exam: thin female awake,alert, NAD, cooperative.  Respiratory system: BBS clear. No increased work of breathing. Cardiovascular system: S1 & S2 heard, RRR. No JVD, murmurs, gallops, clicks or pedal edema. Gastrointestinal system: Abdomen is nondistended, soft and nontender. Normal bowel sounds heard. Central nervous system: Alert and oriented. No focal neurological deficits. Extremities: no CCE.  Data Reviewed: Basic Metabolic Panel: Recent Labs  Lab 02/02/18 0721 02/02/18 1853 02/03/18 0351  NA 139  --  136  K 4.8  --  4.2  CL 107  --  101  CO2 25  --  26  GLUCOSE 238*  --  209*  BUN 18  --  28*  CREATININE 1.20*  --  1.88*  CALCIUM 8.9  --  8.8*  MG  --  1.6* 2.2   Liver Function Tests: No results for input(s): AST, ALT, ALKPHOS, BILITOT, PROT, ALBUMIN in the last 168 hours. No results for input(s): LIPASE, AMYLASE in  the last 168 hours. No results for input(s): AMMONIA in the last 168 hours. CBC: Recent Labs  Lab 02/02/18 0721 02/03/18 0351  WBC 11.9* 6.4  HGB 11.6* 10.0*  HCT 37.9 32.2*  MCV 94.5 90.4  PLT 232 176   Cardiac Enzymes: Recent Labs  Lab 02/02/18 0721 02/02/18 1306 02/02/18 1853 02/03/18 0045  TROPONINI 0.05* 0.79* 1.13* 0.67*   CBG (last 3)  Recent Labs    02/02/18 1755 02/02/18 2130 02/03/18 0823  GLUCAP 202* 154* 191*   Recent Results (from the past 240 hour(s))  MRSA PCR Screening     Status: Abnormal   Collection Time: 02/02/18  8:58 PM  Result Value Ref Range Status   MRSA by PCR POSITIVE (A) NEGATIVE Final    Comment:        The GeneXpert MRSA Assay (FDA approved for NASAL specimens only), is one component of a comprehensive MRSA colonization surveillance program. It is not intended to diagnose MRSA infection nor to guide or monitor treatment for MRSA infections. RESULT CALLED TO, READ BACK BY AND VERIFIED WITH: HEARN,J. AT 0011 ON 02/03/2018 BY EVA Performed at [facility], [address] [place], [place] [address]     Studies: Dg Chest Portable 1 View  Result Date: 02/02/2018 CLINICAL DATA:  Productive cough, wheezing, shortness of breath. History of asthma-COPD, former smoker. EXAM: PORTABLE CHEST 1 VIEW COMPARISON:  PA and lateral chest x-ray of December 03, 2012 FINDINGS: The right lung is well-expanded. The interstitial markings are minimally prominent. On the left the interstitial markings are increased diffusely. The lung is adequately inflated. No alveolar infiltrate is observed. The cardiac silhouette is enlarged and the pulmonary vascularity is mildly engorged. There is calcification in the wall of the aortic arch. The bony thorax exhibits no acute abnormality. IMPRESSION: The findings are worrisome for asymmetric pulmonary edema or interstitial pneumonia greatest on the left. Correlation with clinical and laboratory values is needed. If  pneumonia is felt most likely on clinical grounds, followup PA and lateral chest X-ray is recommended in 3-4 weeks following trial of antibiotic therapy to ensure resolution and exclude underlying malignancy. Electronically Signed   By: David  Martinique M.D.   On: 02/02/2018 07:30   Scheduled Meds: . Chlorhexidine Gluconate Cloth  6 each Topical Q0600  . ezetimibe  10 mg Oral Daily  . fenofibrate  160 mg Oral Daily  . furosemide  40 mg Intravenous Daily  . Influenza vac split quadrivalent PF  0.5 mL Intramuscular Tomorrow-1000  . insulin aspart  0-5 Units Subcutaneous QHS  . insulin aspart  0-9 Units Subcutaneous TID WC  . labetalol  200 mg Oral BID  . mouth rinse  15 mL Mouth Rinse BID  . [START ON 02/04/2018] mupirocin ointment  1 application Nasal BID  . oxyCODONE  5 mg Oral TID  . pantoprazole  40 mg Oral Daily  . PARoxetine  20 mg Oral [id number]  . pneumococcal 23 valent vaccine  0.5 mL Intramuscular Tomorrow-1000  . sodium chloride flush  3 mL Intravenous Q12H  Continuous Infusions: . sodium chloride    . sodium chloride    . heparin 700 Units/hr (02/03/18 0609)  . nitroGLYCERIN 10 mcg/min (02/03/18 1791)    Principal Problem:   Flash pulmonary edema (HCC) Active Problems:   T2DM (type 2 diabetes mellitus) (HCC)   HLD (hyperlipidemia)   Essential hypertension   Asthma   GASTROESOPHAGEAL REFLUX DISEASE, CHRONIC   Depression with anxiety   Hypertensive emergency   Critical Care Time spent: 31 minutes  Irwin Brakeman, MD, FAAFP [facility] Pager [phone]  If 7PM-7AM, please contact night-coverage [email or web address] Password [email or web address] 02/03/2018, 8:47 AM    LOS: 1 day  '

## 2018-02-03 NOTE — Progress Notes (Signed)
Lab called critical trop of 0.67.  MD notified via text page. Value has improved over previous and pt is on NTG and Heparin Drips.

## 2018-02-03 NOTE — Consult Note (Addendum)
Cardiology Consult    Patient ID: Kristen Jones; [id number]; 08-Nov-1950   Admit date: 02/02/2018 Date of Consult: 02/03/2018  Primary Care Provider: Lucia Gaskins, MD Primary Cardiologist: New to [facility] - Dr. Bronson Ing  Patient Profile    BLAKLEE SHORES is a 67 y.o. female with past medical history of HTN, HLD, Type 2 DM, COPD, and anxiety who is being seen today for the evaluation of chest pain and elevated troponin at the request of Dr. Olevia Bowens.   History of Present Illness    [name] presented to [facility] ED on 02/02/2018 for evaluation of worsening dyspnea, found to have oxygen saturations in the 80's on RA. Was also found to be hypertensive with BP at 169/107 when initially checked. Labs showed WBC 11.9, Hgb 11.6, platelets 232, Na+ 139, K+ 4.8, and creatinine 1.20. BNP elevated to 579. Initial troponin slightly elevated to 0.05. CXR showed asymmetric edema versus interstitial PNA. EKG showed NSR, HR 85, with TWI along the anterolateral leads which is new since prior imaging in 2012.  She was admitted for acute hypoxic respiratory failure in the setting of hypertensive urgency and started on NTG drip along with IV Lasix 40mg  BID. Was also continued on PTA Lisinopril and Labetalol. An echocardiogram was obtained and showed a reduced EF of 45% with Grade 2 DD and HK of the apical septal, apical lateral, and apical myocardium. Also noted to have mild AI and mild MR. Throughout admission, troponin values did trend upwards to 0.79, 1.13, and 0.67. Repeat EKG shows TWI along the anterolateral leads has significantly worsened. She was therefore started on Heparin overnight and the Cardiology fellow recommended Cardiology consult in the morning. Of mention, Nephrology was also consulted on the patient as creatinine has trended up to 1.88 this AM. She was started on IVF at 75 mL/hr although a dose of IV Lasix 40mg  was also ordered for this morning.   In talking with the patient today,  she reports the day of admission she developed acute weakness, fatigue, and dyspnea. She denies any specific chest pain or palpitations. Reports not being overly active at home but lives by herself. She is illiterate and reports her friend assists with medication management. No known family history of CAD but reports two sisters died in their 54'O due to complications from diabetes. She is a former smoke but quit 10 years ago. Denies any alcohol use or recreational drug use.   She reports her respiratory status has significantly improved since admission. Denies any pain or symptoms at this current time.    Past Medical History:  Diagnosis Date  . Arthritis   . COPD (chronic obstructive pulmonary disease) ([place])   . Depression with anxiety 02/02/2018  . Diabetes mellitus   . High cholesterol   . Hypertension   . Mental disorder     Past Surgical History:  Procedure Laterality Date  . CHOLECYSTECTOMY    . KNEE SURGERY    . MULTIPLE EXTRACTIONS WITH ALVEOLOPLASTY  06/10/2011   Procedure: MULTIPLE EXTRACION WITH ALVEOLOPLASTY;  Surgeon: Gae Bon, DDS;  Location: [facility];  Service: Oral Surgery;  Laterality: Bilateral;  Extractions of one,six,eight,nine,eleven,twenty,twenty-one,twenty-two,twenty-four,twenty-five,twenty-six,twenty-seven,twenty-eight,twenty-nine     Home Medications:  Prior to Admission medications   Medication Sig Start Date End Date Taking? Authorizing Provider  albuterol (PROVENTIL HFA;VENTOLIN HFA) 108 (90 Base) MCG/ACT inhaler Inhale 1 puff into the lungs every 6 (six) hours as needed for wheezing or shortness of breath.   Yes [provider]  dapagliflozin propanediol (FARXIGA) 10 MG TABS tablet Take 10 mg by mouth daily.   Yes [provider]  ezetimibe (ZETIA) 10 MG tablet Take 10 mg by mouth daily.    Yes [provider]  furosemide (LASIX) 20 MG tablet Take 20 mg by mouth daily.    Yes [provider]  labetalol (NORMODYNE) 200  MG tablet Take 200 mg by mouth 2 (two) times daily.   Yes [provider]  lisinopril (PRINIVIL,ZESTRIL) 20 MG tablet Take 20 mg by mouth daily.   Yes [provider]  metFORMIN (GLUCOPHAGE) 500 MG tablet Take by mouth 2 (two) times daily with a meal.   Yes [provider]  naproxen (NAPROSYN) 500 MG tablet Take 500 mg by mouth 2 (two) times daily with a meal.   Yes [provider]  oxyCODONE (OXY IR/ROXICODONE) 5 MG immediate release tablet Take 5 mg by mouth 3 (three) times daily.   Yes [provider]  pantoprazole (PROTONIX) 40 MG tablet Take 40 mg by mouth daily.   Yes [provider]  PARoxetine (PAXIL) 20 MG tablet Take 20 mg by mouth every morning.    Yes [provider]  albuterol-ipratropium (COMBIVENT) 18-103 MCG/ACT inhaler Inhale 2 puffs into the lungs every 6 (six) hours as needed for wheezing.    [provider]  cephALEXin (KEFLEX) 500 MG capsule Take 1 capsule (500 mg total) by mouth 4 (four) times daily. Patient not taking: Reported on 11/18/2016 06/22/16   Lily Kocher, PA-C    Inpatient Medications: Scheduled Meds: . Chlorhexidine Gluconate Cloth  6 each Topical Q0600  . ezetimibe  10 mg Oral Daily  . fenofibrate  160 mg Oral Daily  . furosemide  40 mg Intravenous Daily  . Influenza vac split quadrivalent PF  0.5 mL Intramuscular Tomorrow-1000  . insulin aspart  0-5 Units Subcutaneous QHS  . insulin aspart  0-9 Units Subcutaneous TID WC  . labetalol  200 mg Oral BID  . mouth rinse  15 mL Mouth Rinse BID  . [START ON 02/04/2018] mupirocin ointment  1 application Nasal BID  . oxyCODONE  5 mg Oral TID  . pantoprazole  40 mg Oral Daily  . PARoxetine  20 mg Oral BH-[id number]  . pneumococcal 23 valent vaccine  0.5 mL Intramuscular Tomorrow-1000  . sodium chloride flush  3 mL Intravenous Q12H   Continuous Infusions: . sodium chloride    . sodium chloride    . heparin 700 Units/hr (02/03/18 0609)  .  nitroGLYCERIN 10 mcg/min (02/03/18 0609)   PRN Meds: sodium chloride, acetaminophen, ondansetron (ZOFRAN) IV, sodium chloride flush  Allergies:    Allergies  Allergen Reactions  . Fluoxetine Hcl     REACTION: UNKNOWN REACTION  . Methocarbamol Nausea And Vomiting  . Metoclopramide Hcl     REACTION: UNKNOWN REACTION  . Motrin [Ibuprofen] Other (See Comments)    REACTION UNKNOWN  . Rabeprazole Sodium     REACTION: UNKNOWN REACTION    Social History:   Social History   Socioeconomic History  . Marital status: Widowed    Spouse name: Not on file  . Number of children: Not on file  . Years of education: Not on file  . Highest education level: Not on file  Occupational History  . Not on file  Social Needs  . Financial resource strain: Not on file  . Food insecurity:    Worry: Not on file    Inability: Not on file  . Transportation  needs:    Medical: Not on file    Non-medical: Not on file  Tobacco Use  . Smoking status: Former Research scientist (life sciences)  . Smokeless tobacco: Never Used  Substance and Sexual Activity  . Alcohol use: No  . Drug use: No  . Sexual activity: Never  Lifestyle  . Physical activity:    Days per week: Not on file    Minutes per session: Not on file  . Stress: Not on file  Relationships  . Social connections:    Talks on phone: Not on file    Gets together: Not on file    Attends religious service: Not on file    Active member of club or organization: Not on file    Attends meetings of clubs or organizations: Not on file    Relationship status: Not on file  . Intimate partner violence:    Fear of current or ex partner: Not on file    Emotionally abused: Not on file    Physically abused: Not on file    Forced sexual activity: Not on file  Other Topics Concern  . Not on file  Social History Narrative  . Not on file     Family History:    Family History  Problem Relation Age of Onset  . Anesthesia problems Neg Hx   . Hypotension Neg Hx   .  Malignant hyperthermia Neg Hx   . Pseudochol deficiency Neg Hx       Review of Systems    General:  No chills, fever, night sweats or weight changes. Positive for fatigue and weakness.  Cardiovascular:  No chest pain, edema, orthopnea, palpitations, paroxysmal nocturnal dyspnea. Positive for dyspnea.  Dermatological: No rash, lesions/masses Respiratory: No cough, dyspnea Urologic: No hematuria, dysuria Abdominal:   No nausea, vomiting, diarrhea, bright red blood per rectum, melena, or hematemesis Neurologic:  No visual changes, wkns, changes in mental status. All other systems reviewed and are otherwise negative except as noted above.  Physical Exam/Data    Vitals:   02/03/18 0655 02/03/18 0656 02/03/18 0700 02/03/18 0815  BP:   (!) 150/74   Pulse: 74 76 73   Resp: 19 19 17    Temp:      TempSrc:    (P) Oral  SpO2: 95% 95% 94%   Weight:      Height:        Intake/Output Summary (Last 24 hours) at 02/03/2018 0846 Last data filed at 02/03/2018 0634 Gross per 24 hour  Intake 1614.18 ml  Output 2820 ml  Net -1205.82 ml   Filed Weights   02/02/18 2108 02/03/18 0500 02/03/18 8563  Weight: 58.6 kg 58.6 kg 58.4 kg   Body mass index is 26.91 kg/m.   General: Pleasant, Caucasian female appearing in NAD Psych: Normal affect. Neuro: Alert and oriented X 3. Moves all extremities spontaneously. HEENT: Normal  Neck: Supple without bruits or JVD. Lungs:  Resp regular and unlabored, CTA without wheezing or rales. Heart: RRR no s3, s4, or murmurs. Abdomen: Soft, non-tender, non-distended, BS + x 4.  Extremities: No clubbing, cyanosis or edema. DP/PT/Radials 2+ and equal bilaterally.    Labs/Studies     Relevant CV Studies:  Echocardiogram: 02/02/2018 Study Conclusions  - Left ventricle: The cavity size was normal. Systolic function was   mildly reduced. The estimated ejection fraction was 45%. Features   are consistent with a pseudonormal left ventricular filling    pattern, with concomitant abnormal relaxation and increased   filling pressure (  grade 2 diastolic dysfunction). Doppler   parameters are consistent with high ventricular filling pressure.   Mild to moderate concentric LVH. - Regional wall motion abnormality: Hypokinesis of the apical   septal, apical lateral, and apical myocardium. - Aortic valve: Moderately calcified annulus. Trileaflet. There was   mild regurgitation. - Mitral valve: Mildly calcified annulus. There was mild   regurgitation. - Left atrium: The atrium was severely dilated. - Atrial septum: No defect or patent foramen ovale was identified. - Systemic veins: The IVC is dilated with normal respiratory   variation. Estimated right atrial pressure is 8 mmHg.  Laboratory Data:  Chemistry Recent Labs  Lab 02/02/18 0721 02/03/18 0351  NA 139 136  K 4.8 4.2  CL 107 101  CO2 25 26  GLUCOSE 238* 209*  BUN 18 28*  CREATININE 1.20* 1.88*  CALCIUM 8.9 8.8*  GFRNONAA 46* 27*  GFRAA 54* 31*  ANIONGAP 7 9    No results for input(s): PROT, ALBUMIN, AST, ALT, ALKPHOS, BILITOT in the last 168 hours. Hematology Recent Labs  Lab 02/02/18 0721 02/03/18 0351  WBC 11.9* 6.4  RBC 4.01 3.56*  HGB 11.6* 10.0*  HCT 37.9 32.2*  MCV 94.5 90.4  MCH 28.9 28.1  MCHC 30.6 31.1  RDW 15.0 14.9  PLT 232 176   Cardiac Enzymes Recent Labs  Lab 02/02/18 0721 02/02/18 1306 02/02/18 1853 02/03/18 0045  TROPONINI 0.05* 0.79* 1.13* 0.67*    Recent Labs  Lab 02/02/18 0730  TROPIPOC 0.02    BNP Recent Labs  Lab 02/02/18 0721  BNP 579.0*    DDimer No results for input(s): DDIMER in the last 168 hours.  Radiology/Studies:  Dg Chest Portable 1 View  Result Date: 02/02/2018 CLINICAL DATA:  Productive cough, wheezing, shortness of breath. History of asthma-COPD, former smoker. EXAM: PORTABLE CHEST 1 VIEW COMPARISON:  PA and lateral chest x-ray of December 03, 2012 FINDINGS: The right lung is well-expanded. The interstitial  markings are minimally prominent. On the left the interstitial markings are increased diffusely. The lung is adequately inflated. No alveolar infiltrate is observed. The cardiac silhouette is enlarged and the pulmonary vascularity is mildly engorged. There is calcification in the wall of the aortic arch. The bony thorax exhibits no acute abnormality. IMPRESSION: The findings are worrisome for asymmetric pulmonary edema or interstitial pneumonia greatest on the left. Correlation with clinical and laboratory values is needed. If pneumonia is felt most likely on clinical grounds, followup PA and lateral chest X-ray is recommended in 3-4 weeks following trial of antibiotic therapy to ensure resolution and exclude underlying malignancy. Electronically Signed   By: David  Martinique M.D.   On: 02/02/2018 07:30     Assessment & Plan    1. NSTEMI - presented with worsening weakness, fatigue, and dyspnea. Labs showed BNP elevated to 440 and cyclic troponin values have trended upwards from 0.05, 0.79, 1.13, and now going back down to 0.67. EKG shows NSR, HR 85, with TWI along the anterolateral leads which is now more pronounced on repeat tracings. Echocardiogram also shows a reduced EF of 45% with Grade 2 DD and HK of the apical septal, apical lateral, and apical myocardium.  - given her clinical presentation, elevated enzymes, abnormal EKG, and cardiomyopathy with presumed new WMA, would recommend a cardiac catheterization for definitive evaluation once her kidney function improves. Was reviewed with the patient today who is somewhat hesitant about transferring to [facility] but understands why the procedure is recommended.  - would continue Heparin for  now along with NTG drip. Start ASA 81mg  daily along with statin therapy. Would also recommend transitioning Labetalol to Coreg given her cardiomyopathy.   2. HTN - BP initially elevated to 203/135, improved to 135/79 on most recent check. - currently on Labetalol  200mg  BID and NTG drip. ACE-I held due to AKI. Would recommend switching Labetalol to Coreg or Toprol-XL in the setting of her newly-diagnosed cardiomyopathy.    3. HLD - Previously intolerant to statins. She has been continued on PTA Zetia 10 mg daily. Will add Crestor 20 mg daily in the setting of her NSTEMI and repeat FLP.  4. Type 2 DM - will order Hgb A1c. Per admitting team.   5. AKI - Unclear baseline as creatinine was elevated to 1.7 approximately a year ago was improved to 1.20 yesterday. Trending up to 1.88 today. Will stop IV Lasix as she appears euvolemic by examination.  Nephrology is following and started low-dose IVF. Lisinopril also held.    For questions or updates, please contact [facility] Please consult [email or web address] for contact info under Cardiology/STEMI.  Signed, Erma Heritage, PA-C 02/03/2018, 8:46 AM Pager: [phone]  The patient was seen and examined, and I agree with the history, physical exam, assessment and plan as documented above, with modifications as noted below. I have also personally reviewed all relevant documentation, old records, labs, and both radiographic and cardiovascular studies. I have also independently interpreted old and new ECG's.  Briefly, this is a 67 year old woman with a history of hypertension, COPD, hyperlipidemia, and type 2 diabetes mellitus who presented to the ED on 02/02/2018 with fatigue and shortness of breath.  She told me that she awoke at 5 AM yesterday to use the bathroom and felt markedly fatigued and was short of breath and got worried and called EMS.  She was hypoxemic and markedly hypertensive with labs reviewed above.  Radiographic studies also reviewed above.  Initial ECG demonstrated sinus tachycardia.  Subsequent ECG demonstrated sinus rhythm with diffuse T wave inversions suggestive of inferior and anterolateral ischemia.  Echocardiogram reviewed above with mildly reduced left ventricular systolic  function, LVEF 37%, grade 2 diastolic dysfunction with elevated ventricular filling pressures, and apical septal, apical lateral, and apical hypokinesis.  There was mild aortic and mitral regurgitation and severe biatrial dilatation.  Troponins peaked at 1.13 and was 0.67 most recently.  She was started on IV heparin.  She was given both IV fluids and Lasix by nephrology and creatinine is up to 1.88 this morning.  She did receive a dose of IV Lasix this morning which I confirmed with nursing.  She currently feels slightly better and denies chest pain and said breathing has improved.  She is resting in bed comfortably.  She is still hypertensive, 154/79.  She had been 203/135 at the time of presentation in the ED.  Overall clinical presentation consistent with non-STEMI, hypertensive urgency, and acute combined systolic and diastolic heart failure.  She will need cardiac catheterization once renal function improves.  This was discussed both by myself and B. Strader PA-C with the patient independently.    Lasix will be discontinued.  She is receiving gentle IV fluid hydration.  Continue IV heparin.  We will start aspirin 81 mg and statin therapy.  She was previously intolerant of statin therapy so we will try rosuvastatin 10 mg.  She is also on Zetia. We will switch labetalol to carvedilol given left ventricular dysfunction/cardiomyopathy.  ACE inhibitors are on hold due to acute renal failure.  Kate Sable, MD, [facility]  02/03/2018 11:13 AM

## 2018-02-03 NOTE — Evaluation (Signed)
Physical Therapy Evaluation Patient Details Name: Kristen Jones MRN: [id number] DOB: 1951-01-27 Today's Date: 02/03/2018   History of Present Illness   Kristen Jones is a 67 y.o. female with medical history significant of 67 year old with past medical history relevant for depression/anxiety, hypertension, type 2 diabetes, asthma/COPD, hyperlipidemia who presents with acute hypoxic respiratory failure secondary to hypertensive emergency.  Patient reports she was doing fairly well until this morning when she began to have severe shortness of breath.  She did not have chest pain but did have chest tightness.  She did not have any nausea or vomiting or abdominal pain.  She noted both dyspnea on exertion and increased shortness of breath at rest.  She has not had any lower extreme any swelling orthopnea.  She has not been checking her blood pressures as she does not have a reliable blood pressure cuff.  Reports compliance with her antihypertensives.  She has not had any fevers, cough, congestion, rhinorrhea.  Patient tried a breathing treatment as she attributed initially her shortness of breath to COPD but it did not improve and so she called EMS.  Of note her blood sugars been running in the 100s.    Clinical Impression  Patient functioning near baseline for functional mobility and gait, able to ambulate around nursing station without loss of balance, on room air with O2 saturation between 89-93% and tolerated sitting up in chair after therapy with O2 saturation above 90%.  Plan:  Patient discharged from physical therapy to care of nursing for ambulation daily as tolerated for length of stay.    Follow Up Recommendations Supervision - Intermittent;No PT follow up    Equipment Recommendations  None recommended by PT    Recommendations for Other Services       Precautions / Restrictions Precautions Precautions: None Restrictions Weight Bearing Restrictions: No      Mobility  Bed  Mobility Overal bed mobility: Modified Independent             General bed mobility comments: increased time  Transfers Overall transfer level: Modified independent               General transfer comment: increased time  Ambulation/Gait Ambulation/Gait assistance: Supervision Gait Distance (Feet): 100 Feet Assistive device: None Gait Pattern/deviations: Decreased step length - right;Decreased step length - left;Decreased stride length;Decreased stance time - left Gait velocity: decreased   General Gait Details: demonstrates slightly unsteady gait with limping on LLE which is baseline since childhood due to surgeries, no loss of balance  Stairs            Wheelchair Mobility    Modified Rankin (Stroke Patients Only)       Balance Overall balance assessment: Mild deficits observed, not formally tested                                           Pertinent Vitals/Pain Pain Assessment: Faces Faces Pain Scale: Hurts a little bit Pain Location: chest discomfort when coughing Pain Descriptors / Indicators: Discomfort Pain Intervention(s): Limited activity within patient's tolerance;Monitored during session    Home Living Family/patient expects to be discharged to:: Private residence Living Arrangements: Alone Available Help at Discharge: Friend(s) Type of Home: House Home Access: Stairs to enter Entrance Stairs-Rails: Right;Left;Can reach both Entrance Stairs-Number of Steps: 4-5 Home Layout: Two level Home Equipment: Environmental consultant - 2 wheels;Cane - single point;Bedside commode  Prior Function Level of Independence: Independent with assistive device(s)         Comments: household ambulator with RW PRN     Hand Dominance        Extremity/Trunk Assessment   Upper Extremity Assessment Upper Extremity Assessment: Overall WFL for tasks assessed    Lower Extremity Assessment Lower Extremity Assessment: Overall WFL for tasks  assessed    Cervical / Trunk Assessment Cervical / Trunk Assessment: Normal  Communication   Communication: No difficulties  Cognition Arousal/Alertness: Awake/alert Behavior During Therapy: WFL for tasks assessed/performed Overall Cognitive Status: Within Functional Limits for tasks assessed                                        General Comments      Exercises     Assessment/Plan    PT Assessment Patent does not need any further PT services  PT Problem List         PT Treatment Interventions      PT Goals (Current goals can be found in the Care Plan section)  Acute Rehab PT Goals Patient Stated Goal: return home PT Goal Formulation: With patient Time For Goal Achievement: 02/03/18 Potential to Achieve Goals: Good    Frequency     Barriers to discharge        Co-evaluation               AM-PAC PT "6 Clicks" Daily Activity  Outcome Measure Difficulty turning over in bed (including adjusting bedclothes, sheets and blankets)?: None Difficulty moving from lying on back to sitting on the side of the bed? : None Difficulty sitting down on and standing up from a chair with arms (e.g., wheelchair, bedside commode, etc,.)?: None Help needed moving to and from a bed to chair (including a wheelchair)?: None Help needed walking in hospital room?: None Help needed climbing 3-5 steps with a railing? : A Little 6 Click Score: 23    End of Session Equipment Utilized During Treatment: Gait belt Activity Tolerance: Patient tolerated treatment well;Patient limited by fatigue Patient left: in chair;with call bell/phone within reach Nurse Communication: Mobility status PT Visit Diagnosis: Unsteadiness on feet (R26.81);Other abnormalities of gait and mobility (R26.89);Muscle weakness (generalized) (M62.81)    Time: 8453-6468 PT Time Calculation (min) (ACUTE ONLY): 36 min   Charges:   PT Evaluation $PT Eval Moderate Complexity: 1 Mod PT  Treatments $Therapeutic Activity: 23-37 mins        2:10 PM, 02/03/18 Lonell Grandchild, MPT Physical Therapist with [facility] [phone] office [phone] mobile phone

## 2018-02-03 NOTE — Progress Notes (Signed)
ANTICOAGULATION CONSULT NOTE   Pharmacy Consult for IV Heparin Indication: chest pain/ACS   Patient Measurements: Height: 4\' 10"  (147.3 cm) Weight: 129 lb 3 oz (58.6 kg) IBW/kg (Calculated) : 40.9 Heparin Dosing Weight:  HEPARIN DW (KG): 53.4    Vital Signs: Temp: 98.2 F (36.8 C) (11/11 2108) Temp Source: Oral (11/11 2108) BP: 135/63 (11/12 0600) Pulse Rate: 70 (11/12 0600)  Labs: Recent Labs    02/02/18 0721 02/02/18 1306 02/02/18 1853 02/02/18 2041 02/03/18 0045 02/03/18 0351 02/03/18 0352  HGB 11.6*  --   --   --   --  10.0*  --   HCT 37.9  --   --   --   --  32.2*  --   PLT 232  --   --   --   --  176  --   LABPROT  --   --   --  13.2  --   --   --   INR  --   --   --  1.01  --   --   --   HEPARINUNFRC  --   --   --  0.36  --   --  0.42  CREATININE 1.20*  --   --   --   --  1.88*  --   TROPONINI 0.05* 0.79* 1.13*  --  0.67*  --   --     Estimated Creatinine Clearance: 22.6 mL/min (A) (by C-G formula based on SCr of 1.88 mg/dL (H)).   Medical History: Past Medical History:  Diagnosis Date  . Arthritis   . COPD (chronic obstructive pulmonary disease) ([place])   . Depression with anxiety 02/02/2018  . Diabetes mellitus   . High cholesterol   . Hypertension   . Mental disorder    Assessment:   Pharmacy consulted to dose heparin for this 27 yof with chest pain/ACS.   Troponins are rising.  11/12 AM update: heparin level is therapeutic this AM, Hgb 10   Goal of Therapy:  Heparin level 0.3-0.7 units/ml Monitor platelets by anticoagulation protocol: Yes   Plan:  Cont heparin at 700 units/hr Confirmatory heparin level in 6-8 hours  Narda Bonds 02/03/2018,6:18 AM

## 2018-02-03 NOTE — Consult Note (Signed)
Reason for Consult: Renal failure and hypertensive emergency Referring Physician: Dr. Oliver Pila is an 67 y.o. female.  HPI: She is a patient who has history of COPD, hypertension, diabetes presently came to the emergency room with complaints of shortness of breath and difficulty in catching her breath.  Patient states that it started all of a sudden.  She denies any chest pain.  She denies any cough.  When she was evaluated in the emergency room she was found to have systolic blood pressure above 200 and flash pulmonary edema hence admitted to the hospital.  Presently she is feeling much better.  She has some nausea but no vomiting.  Patient also denies any difficulty breathing.  Patient denies any previous history of renal failure or kidney stone.  Past Medical History:  Diagnosis Date  . Arthritis   . COPD (chronic obstructive pulmonary disease) ([place])   . Depression with anxiety 02/02/2018  . Diabetes mellitus   . High cholesterol   . Hypertension   . Mental disorder     Past Surgical History:  Procedure Laterality Date  . CHOLECYSTECTOMY    . KNEE SURGERY    . MULTIPLE EXTRACTIONS WITH ALVEOLOPLASTY  06/10/2011   Procedure: MULTIPLE EXTRACION WITH ALVEOLOPLASTY;  Surgeon: Gae Bon, DDS;  Location: [facility];  Service: Oral Surgery;  Laterality: Bilateral;  Extractions of one,six,eight,nine,eleven,twenty,twenty-one,twenty-two,twenty-four,twenty-five,twenty-six,twenty-seven,twenty-eight,twenty-nine    Family History  Problem Relation Age of Onset  . Anesthesia problems Neg Hx   . Hypotension Neg Hx   . Malignant hyperthermia Neg Hx   . Pseudochol deficiency Neg Hx     Social History:  reports that she has quit smoking. She has never used smokeless tobacco. She reports that she does not drink alcohol or use drugs.  Allergies:  Allergies  Allergen Reactions  . Fluoxetine Hcl     REACTION: UNKNOWN REACTION  . Methocarbamol Nausea And Vomiting  . Metoclopramide  Hcl     REACTION: UNKNOWN REACTION  . Motrin [Ibuprofen] Other (See Comments)    REACTION UNKNOWN  . Rabeprazole Sodium     REACTION: UNKNOWN REACTION    Medications: I have reviewed the patient's current medications.  Results for orders placed or performed during the hospital encounter of 02/02/18 (from the past 48 hour(s))  Basic metabolic panel     Status: Abnormal   Collection Time: 02/02/18  7:21 AM  Result Value Ref Range   Sodium 139 135 - 145 mmol/L   Potassium 4.8 3.5 - 5.1 mmol/L   Chloride 107 98 - 111 mmol/L   CO2 25 22 - 32 mmol/L   Glucose, Bld 238 (H) 70 - 99 mg/dL   BUN 18 8 - 23 mg/dL   Creatinine, Ser 1.20 (H) 0.44 - 1.00 mg/dL   Calcium 8.9 8.9 - 10.3 mg/dL   GFR calc non Af Amer 46 (L) >60 mL/min   GFR calc Af Amer 54 (L) >60 mL/min    Comment: (NOTE) The eGFR has been calculated using the CKD EPI equation. This calculation has not been validated in all clinical situations. eGFR's persistently <60 mL/min signify possible Chronic Kidney Disease.    Anion gap 7 5 - 15    Comment: Performed at [facility], [address], [place], [place] [address]  CBC     Status: Abnormal   Collection Time: 02/02/18  7:21 AM  Result Value Ref Range   WBC 11.9 (H) 4.0 - 10.5 K/uL   RBC 4.01 3.87 - 5.11 MIL/uL  Hemoglobin 11.6 (L) 12.0 - 15.0 g/dL   HCT 37.9 36.0 - 46.0 %   MCV 94.5 80.0 - 100.0 fL   MCH 28.9 26.0 - 34.0 pg   MCHC 30.6 30.0 - 36.0 g/dL   RDW 15.0 11.5 - 15.5 %   Platelets 232 150 - 400 K/uL   nRBC 0.0 0.0 - 0.2 %    Comment: Performed at [facility], [address] [place], [place] [address]  Brain natriuretic peptide     Status: Abnormal   Collection Time: 02/02/18  7:21 AM  Result Value Ref Range   B Natriuretic Peptide 579.0 (H) 0.0 - 100.0 pg/mL    Comment: Performed at [facility], [address], [place], [place] [address]  Troponin I STAT - ONCE     Status: Abnormal   Collection Time: 02/02/18  7:21 AM  Result Value Ref Range   Troponin  I 0.05 (HH) <0.03 ng/mL    Comment: CRITICAL RESULT CALLED TO, READ BACK BY AND VERIFIED WITH: [facility] AT 8:10AM ON 02/02/18 BY [facility] Performed at [facility], [address], [place], [place] [address]   TSH     Status: None   Collection Time: 02/02/18  7:21 AM  Result Value Ref Range   TSH 3.143 0.350 - 4.500 uIU/mL    Comment: Performed by a 3rd Generation assay with a functional sensitivity of <=0.01 uIU/mL. Performed at [facility], [address] [place],  [address]   I-stat troponin, ED     Status: None   Collection Time: 02/02/18  7:30 AM  Result Value Ref Range   Troponin i, poc 0.02 0.00 - 0.08 ng/mL   Comment 3            Comment: Due to the release kinetics of cTnI, a negative result within the first hours of the onset of symptoms does not rule out myocardial infarction with certainty. If myocardial infarction is still suspected, repeat the test at appropriate intervals.   HIV antibody (Routine Testing)     Status: None   Collection Time: 02/02/18  1:06 PM  Result Value Ref Range   HIV Screen 4th Generation wRfx Non Reactive Non Reactive    Comment: (NOTE) Performed At: [facility] [place], [place] [id number] Rush Farmer MD ER:[id number]   Procalcitonin - Baseline     Status: None   Collection Time: 02/02/18  1:06 PM  Result Value Ref Range   Procalcitonin 0.50 ng/mL    Comment:        Interpretation: PCT (Procalcitonin) <= 0.5 ng/mL: Systemic infection (sepsis) is not likely. Local bacterial infection is possible. (NOTE)       Sepsis PCT Algorithm           Lower Respiratory Tract                                      Infection PCT Algorithm    ----------------------------     ----------------------------         PCT < 0.25 ng/mL                PCT < 0.10 ng/mL         Strongly encourage             Strongly discourage   discontinuation of antibiotics    initiation of antibiotics    ----------------------------      -----------------------------  PCT 0.25 - 0.50 ng/mL            PCT 0.10 - 0.25 ng/mL               OR       >80% decrease in PCT            Discourage initiation of                                            antibiotics      Encourage discontinuation           of antibiotics    ----------------------------     -----------------------------         PCT >= 0.50 ng/mL              PCT 0.26 - 0.50 ng/mL               AND        <80% decrease in PCT             Encourage initiation of                                             antibiotics       Encourage continuation           of antibiotics    ----------------------------     -----------------------------        PCT >= 0.50 ng/mL                  PCT > 0.50 ng/mL               AND         increase in PCT                  Strongly encourage                                      initiation of antibiotics    Strongly encourage escalation           of antibiotics                                     -----------------------------                                           PCT <= 0.25 ng/mL                                                 OR                                        > 80% decrease in PCT  Discontinue / Do not initiate                                             antibiotics Performed at [facility], [address], [place], [place] [address]   Troponin I - Now Then Q6H     Status: Abnormal   Collection Time: 02/02/18  1:06 PM  Result Value Ref Range   Troponin I 0.79 (HH) <0.03 ng/mL    Comment: CRITICAL RESULT CALLED TO, READ BACK BY AND VERIFIED WITH: SHORE,L AT 1407 ON 11.11.2019 BY ISLEY,B Performed at [facility], [address] [place], [place] [address]   CBG monitoring, ED     Status: Abnormal   Collection Time: 02/02/18  5:55 PM  Result Value Ref Range   Glucose-Capillary 202 (H) 70 - 99 mg/dL  Troponin I - Now Then Q6H     Status: Abnormal   Collection Time: 02/02/18   6:53 PM  Result Value Ref Range   Troponin I 1.13 (HH) <0.03 ng/mL    Comment: CRITICAL VALUE NOTED.  VALUE IS CONSISTENT WITH PREVIOUSLY REPORTED AND CALLED VALUE. Performed at [facility], [address] [place], [place] [address]   Magnesium     Status: Abnormal   Collection Time: 02/02/18  6:53 PM  Result Value Ref Range   Magnesium 1.6 (L) 1.7 - 2.4 mg/dL    Comment: Performed at [facility], [address], [place], [place] [address]  Protime-INR     Status: None   Collection Time: 02/02/18  8:41 PM  Result Value Ref Range   Prothrombin Time 13.2 11.4 - 15.2 seconds   INR 1.01     Comment: Performed at [facility], [address] [place], [place] [address]  Heparin level (unfractionated)     Status: None   Collection Time: 02/02/18  8:41 PM  Result Value Ref Range   Heparin Unfractionated 0.36 0.30 - 0.70 IU/mL    Comment: (NOTE) If heparin results are below expected values, and patient dosage has  been confirmed, suggest follow up testing of antithrombin III levels. Performed at [facility], [address], [place], [place] [address]   MRSA PCR Screening     Status: Abnormal   Collection Time: 02/02/18  8:58 PM  Result Value Ref Range   MRSA by PCR POSITIVE (A) NEGATIVE    Comment:        The GeneXpert MRSA Assay (FDA approved for NASAL specimens only), is one component of a comprehensive MRSA colonization surveillance program. It is not intended to diagnose MRSA infection nor to guide or monitor treatment for MRSA infections. RESULT CALLED TO, READ BACK BY AND VERIFIED WITH: HEARN,J. AT 0011 ON 02/03/2018 BY EVA Performed at [facility], [address] [place], [place] [address]   Glucose, capillary     Status: Abnormal   Collection Time: 02/02/18  9:30 PM  Result Value Ref Range   Glucose-Capillary 154 (H) 70 - 99 mg/dL  Troponin I - Now Then Q6H     Status: Abnormal   Collection Time: 02/03/18 12:45 AM  Result Value Ref Range   Troponin I 0.67 (HH)  <0.03 ng/mL    Comment: CRITICAL RESULT CALLED TO, READ BACK BY AND VERIFIED WITH: WAGONER,R. AT 0130 ON 02/03/2018 BY EVA Performed at [facility], [address], [place],  [address]   Basic metabolic panel  Status: Abnormal   Collection Time: 02/03/18  3:51 AM  Result Value Ref Range   Sodium 136 135 - 145 mmol/L   Potassium 4.2 3.5 - 5.1 mmol/L   Chloride 101 98 - 111 mmol/L   CO2 26 22 - 32 mmol/L   Glucose, Bld 209 (H) 70 - 99 mg/dL   BUN 28 (H) 8 - 23 mg/dL   Creatinine, Ser 1.88 (H) 0.44 - 1.00 mg/dL   Calcium 8.8 (L) 8.9 - 10.3 mg/dL   GFR calc non Af Amer 27 (L) >60 mL/min   GFR calc Af Amer 31 (L) >60 mL/min    Comment: (NOTE) The eGFR has been calculated using the CKD EPI equation. This calculation has not been validated in all clinical situations. eGFR's persistently <60 mL/min signify possible Chronic Kidney Disease.    Anion gap 9 5 - 15    Comment: Performed at [facility], [address] [place], [place] [address]  CBC     Status: Abnormal   Collection Time: 02/03/18  3:51 AM  Result Value Ref Range   WBC 6.4 4.0 - 10.5 K/uL   RBC 3.56 (L) 3.87 - 5.11 MIL/uL   Hemoglobin 10.0 (L) 12.0 - 15.0 g/dL   HCT 32.2 (L) 36.0 - 46.0 %   MCV 90.4 80.0 - 100.0 fL   MCH 28.1 26.0 - 34.0 pg   MCHC 31.1 30.0 - 36.0 g/dL   RDW 14.9 11.5 - 15.5 %   Platelets 176 150 - 400 K/uL   nRBC 0.0 0.0 - 0.2 %    Comment: Performed at [facility], [address], [place], [place] [address]  Procalcitonin     Status: None   Collection Time: 02/03/18  3:51 AM  Result Value Ref Range   Procalcitonin 0.95 ng/mL    Comment:        Interpretation: PCT > 0.5 ng/mL and <= 2 ng/mL: Systemic infection (sepsis) is possible, but other conditions are known to elevate PCT as well. (NOTE)       Sepsis PCT Algorithm           Lower Respiratory Tract                                      Infection PCT Algorithm    ----------------------------     ----------------------------          PCT < 0.25 ng/mL                PCT < 0.10 ng/mL         Strongly encourage             Strongly discourage   discontinuation of antibiotics    initiation of antibiotics    ----------------------------     -----------------------------       PCT 0.25 - 0.50 ng/mL            PCT 0.10 - 0.25 ng/mL               OR       >80% decrease in PCT            Discourage initiation of  antibiotics      Encourage discontinuation           of antibiotics    ----------------------------     -----------------------------         PCT >= 0.50 ng/mL              PCT 0.26 - 0.50 ng/mL                AND       <80% decrease in PCT             Encourage initiation of                                             antibiotics       Encourage continuation           of antibiotics    ----------------------------     -----------------------------        PCT >= 0.50 ng/mL                  PCT > 0.50 ng/mL               AND         increase in PCT                  Strongly encourage                                      initiation of antibiotics    Strongly encourage escalation           of antibiotics                                     -----------------------------                                           PCT <= 0.25 ng/mL                                                 OR                                        > 80% decrease in PCT                                     Discontinue / Do not initiate                                             antibiotics Performed at [facility], [address] [place], [place] [address]   Magnesium     Status: None   Collection Time: 02/03/18  3:51 AM  Result Value Ref Range   Magnesium 2.2 1.7 - 2.4 mg/dL    Comment: Performed at [facility], [address] [place], [place] [address]  Heparin level (unfractionated)     Status: None   Collection Time: 02/03/18  3:52 AM  Result Value Ref Range   Heparin Unfractionated 0.42  0.30 - 0.70 IU/mL    Comment: (NOTE) If heparin results are below expected values, and patient dosage has  been confirmed, suggest follow up testing of antithrombin III levels. Performed at [facility], [address], [place], [place] [address]     Dg Chest Portable 1 View  Result Date: 02/02/2018 CLINICAL DATA:  Productive cough, wheezing, shortness of breath. History of asthma-COPD, former smoker. EXAM: PORTABLE CHEST 1 VIEW COMPARISON:  PA and lateral chest x-ray of December 03, 2012 FINDINGS: The right lung is well-expanded. The interstitial markings are minimally prominent. On the left the interstitial markings are increased diffusely. The lung is adequately inflated. No alveolar infiltrate is observed. The cardiac silhouette is enlarged and the pulmonary vascularity is mildly engorged. There is calcification in the wall of the aortic arch. The bony thorax exhibits no acute abnormality. IMPRESSION: The findings are worrisome for asymmetric pulmonary edema or interstitial pneumonia greatest on the left. Correlation with clinical and laboratory values is needed. If pneumonia is felt most likely on clinical grounds, followup PA and lateral chest X-ray is recommended in 3-4 weeks following trial of antibiotic therapy to ensure resolution and exclude underlying malignancy. Electronically Signed   By: David  Martinique M.D.   On: 02/02/2018 07:30    Review of Systems  Constitutional: Negative for chills and fever.  Respiratory: Positive for shortness of breath.   Cardiovascular: Negative for chest pain, palpitations, leg swelling and PND.  Gastrointestinal: Positive for nausea. Negative for diarrhea.   Blood pressure (!) 150/74, pulse 73, temperature 98.2 F (36.8 C), temperature source Oral, resp. rate 17, height '4\' 10"'$  (1.473 m), weight 58.4 kg, SpO2 94 %. Physical Exam  Assessment/Plan: 1] difficulty breathing: Possibly a combination of flash pulmonary edema/COPD.  Presently feeling much  better. 2] hypertension: Longstanding.  Patient states that she has been on multiple medications.  Her blood pressure is much better. 3] renal failure: At this moment seems to be chronic.  Her creatinine is 1.70 on 06/22/2016.  Her EGFR was 31 cc/min/1.73 hence stage III.  Etiology could be secondary to hypertension.  Presently her creatinine is 1.8 no significant change.  Etiology could be secondary to hypertension/diabetes.  Other etiologies at this morning cannot ruled out. 3] history of diabetes 5] history of asthma/COPD 6] history of depression 7] anemia: Her hemoglobin remains within our target goal 8] elevated troponin: Patient at this moment denies any chest pain.  However she has nausea.  With history of diabetes at this moment MI cannot be ruled out.  Patient is on heparin. Plan: We will check ANA, complement, hepatitis B surface antigen, hepatitis C antibody and ANCA 2] we will do ultrasound of the kidneys 3] we will start her on half-normal saline at 75 cc/h 4] we will check a renal panel in the morning.    Lenea Bywater S 02/03/2018, 8:28 AM

## 2018-02-04 DIAGNOSIS — E1169 Type 2 diabetes mellitus with other specified complication: Secondary | ICD-10-CM

## 2018-02-04 DIAGNOSIS — I5041 Acute combined systolic (congestive) and diastolic (congestive) heart failure: Secondary | ICD-10-CM

## 2018-02-04 LAB — RENAL FUNCTION PANEL
Albumin: 3.4 g/dL — ABNORMAL LOW (ref 3.5–5.0)
Anion gap: 10 (ref 5–15)
BUN: 29 mg/dL — ABNORMAL HIGH (ref 8–23)
CO2: 27 mmol/L (ref 22–32)
Calcium: 9.2 mg/dL (ref 8.9–10.3)
Chloride: 100 mmol/L (ref 98–111)
Creatinine, Ser: 1.43 mg/dL — ABNORMAL HIGH (ref 0.44–1.00)
GFR, EST AFRICAN AMERICAN: 43 mL/min — AB (ref >60)
GFR, EST NON AFRICAN AMERICAN: 38 mL/min — AB (ref >60)
Glucose, Bld: 210 mg/dL — ABNORMAL HIGH (ref 70–99)
PHOSPHORUS: 3.6 mg/dL (ref 2.5–4.6)
POTASSIUM: 4.6 mmol/L (ref 3.5–5.1)
Sodium: 137 mmol/L (ref 135–145)

## 2018-02-04 LAB — GLUCOSE, CAPILLARY
Glucose-Capillary: 210 mg/dL — ABNORMAL HIGH (ref 70–99)
Glucose-Capillary: 246 mg/dL — ABNORMAL HIGH (ref 70–99)
Glucose-Capillary: 181 mg/dL — ABNORMAL HIGH (ref 70–99)
Glucose-Capillary: 158 mg/dL — ABNORMAL HIGH (ref 70–99)

## 2018-02-04 LAB — ANTINUCLEAR ANTIBODIES, IFA: ANA Ab, IFA: NEGATIVE

## 2018-02-04 LAB — CBC
HCT: 35.2 % — ABNORMAL LOW (ref 36.0–46.0)
Hemoglobin: 11 g/dL — ABNORMAL LOW (ref 12.0–15.0)
MCH: 28.3 pg (ref 26.0–34.0)
MCHC: 31.3 g/dL (ref 30.0–36.0)
MCV: 90.5 fL (ref 80.0–100.0)
Platelets: 191 10*3/uL (ref 150–400)
RBC: 3.89 MIL/uL (ref 3.87–5.11)
RDW: 14.9 % (ref 11.5–15.5)
WBC: 6.5 10*3/uL (ref 4.0–10.5)
nRBC: 0 % (ref 0.0–0.2)

## 2018-02-04 LAB — HEPATITIS C ANTIBODY

## 2018-02-04 LAB — PROTEIN, URINE, 24 HOUR
COLLECTION INTERVAL-UPROT: 24 h
PROTEIN, 24H URINE: 204 mg/d — AB (ref 50–100)
Protein, Urine: 17 mg/dL
URINE TOTAL VOLUME-UPROT: 1200 mL

## 2018-02-04 LAB — MPO/PR-3 (ANCA) ANTIBODIES
ANCA Proteinase 3: <3.5 U/mL (ref 0.0–3.5)
Myeloperoxidase Abs: <9 U/mL (ref 0.0–9.0)

## 2018-02-04 LAB — HEPARIN LEVEL (UNFRACTIONATED)
HEPARIN UNFRACTIONATED: 0.24 [IU]/mL — AB (ref 0.30–0.70)
Heparin Unfractionated: 0.1 IU/mL — ABNORMAL LOW (ref 0.30–0.70)

## 2018-02-04 LAB — COMPLEMENT, TOTAL

## 2018-02-04 LAB — C4 COMPLEMENT: Complement C4, Body Fluid: 24 mg/dL (ref 14–44)

## 2018-02-04 LAB — PROCALCITONIN: Procalcitonin: 0.75 ng/mL

## 2018-02-04 LAB — C3 COMPLEMENT: C3 COMPLEMENT: 124 mg/dL (ref 82–167)

## 2018-02-04 LAB — ALBUMIN: Albumin: 3.6 g/dL (ref 3.5–5.0)

## 2018-02-04 LAB — TROPONIN I: Troponin I: 0.2 ng/mL (ref <0.03)

## 2018-02-04 LAB — HEPATITIS B SURFACE ANTIGEN: HEP B S AG: NEGATIVE

## 2018-02-04 MED ORDER — INSULIN GLARGINE 100 UNIT/ML ~~LOC~~ SOLN
5.0000 [IU] | SUBCUTANEOUS | Status: DC
  Administered 2018-02-04: 5 [IU] via SUBCUTANEOUS
  Filled 2018-02-04 (×2): qty 0.05

## 2018-02-04 MED ORDER — SODIUM CHLORIDE 0.9 % IV SOLN: 250.0000 mL | INTRAVENOUS | Status: DC | PRN

## 2018-02-04 MED ORDER — INSULIN ASPART 100 UNIT/ML ~~LOC~~ SOLN
3.0000 [IU] | Freq: Three times a day (TID) | SUBCUTANEOUS | Status: DC
  Administered 2018-02-04: 3 [IU] via SUBCUTANEOUS

## 2018-02-04 MED ORDER — SODIUM CHLORIDE 0.9% FLUSH: 3.0000 mL | INTRAVENOUS | Status: DC | PRN

## 2018-02-04 MED ORDER — HEPARIN BOLUS VIA INFUSION
1500.0000 [IU] | Freq: Once | INTRAVENOUS | Status: AC
  Administered 2018-02-04: 1500 [IU] via INTRAVENOUS
  Filled 2018-02-04: qty 1500

## 2018-02-04 MED ORDER — SODIUM CHLORIDE 0.9% FLUSH: 3.0000 mL | Freq: Two times a day (BID) | INTRAVENOUS | Status: DC

## 2018-02-04 MED ORDER — SODIUM CHLORIDE 0.9 % IV SOLN
INTRAVENOUS | Status: DC
  Administered 2018-02-05: 06:00:00 via INTRAVENOUS

## 2018-02-04 MED ORDER — HEPARIN BOLUS VIA INFUSION
800.0000 [IU] | Freq: Once | INTRAVENOUS | Status: AC
  Administered 2018-02-04: 800 [IU] via INTRAVENOUS
  Filled 2018-02-04: qty 800

## 2018-02-04 MED ORDER — INSULIN ASPART 100 UNIT/ML ~~LOC~~ SOLN
6.0000 [IU] | Freq: Three times a day (TID) | SUBCUTANEOUS | Status: DC
  Administered 2018-02-04: 6 [IU] via SUBCUTANEOUS

## 2018-02-04 MED ORDER — INSULIN GLARGINE 100 UNIT/ML ~~LOC~~ SOLN
10.0000 [IU] | Freq: Every morning | SUBCUTANEOUS | Status: DC
  Administered 2018-02-05 – 2018-02-06 (×2): 10 [IU] via SUBCUTANEOUS
  Filled 2018-02-04 (×3): qty 0.1

## 2018-02-04 MED ORDER — ASPIRIN 81 MG PO CHEW
81.0000 mg | CHEWABLE_TABLET | ORAL | Status: AC
  Administered 2018-02-05: 81 mg via ORAL
  Filled 2018-02-04: qty 1

## 2018-02-04 NOTE — Progress Notes (Signed)
Progress Note  Patient Name: Kristen Jones Date of Encounter: 02/04/2018  Primary Cardiologist: New to [facility] - Dr. Bronson Ing  Subjective   Patient is doing well this morning and denies chest pain, palpitations, and shortness of breath.  Inpatient Medications    Scheduled Meds: . aspirin EC  81 mg Oral Daily  . carvedilol  6.25 mg Oral BID WC  . Chlorhexidine Gluconate Cloth  6 each Topical Q0600  . ezetimibe  10 mg Oral Daily  . fenofibrate  160 mg Oral Daily  . insulin aspart  0-5 Units Subcutaneous QHS  . insulin aspart  0-9 Units Subcutaneous TID WC  . insulin aspart  3 Units Subcutaneous TID WC  . insulin glargine  5 Units Subcutaneous BH-q7a  . mouth rinse  15 mL Mouth Rinse BID  . mupirocin ointment  1 application Nasal BID  . oxyCODONE  5 mg Oral TID  . pantoprazole  40 mg Oral Daily  . PARoxetine  20 mg Oral BH-q7a  . rosuvastatin  10 mg Oral q1800  . sodium chloride flush  3 mL Intravenous Q12H   Continuous Infusions: . sodium chloride 75 mL/hr at 02/04/18 0923  . sodium chloride    . heparin 900 Units/hr (02/04/18 0923)  . nitroGLYCERIN 15 mcg/min (02/04/18 0923)   PRN Meds: sodium chloride, acetaminophen, ondansetron (ZOFRAN) IV, sodium chloride flush   Vital Signs    Vitals:   02/04/18 0747 02/04/18 0757 02/04/18 0800 02/04/18 0900  BP:  (!) 149/68 (!) 147/85 140/62  Pulse: 75 73 77 80  Resp:    (!) 29  Temp: 98.3 F (36.8 C)     TempSrc: Oral     SpO2: 94%  94% 97%  Weight:      Height:        Intake/Output Summary (Last 24 hours) at 02/04/2018 1044 Last data filed at 02/04/2018 0923 Gross per 24 hour  Intake 1894.59 ml  Output 2950 ml  Net -1055.41 ml   Filed Weights   02/03/18 0500 02/03/18 0632 02/04/18 0500  Weight: 58.6 kg 58.4 kg 58.6 kg    Telemetry    Sinus rhythm with PACs and paroxysmal atrial runs- Personally Reviewed  ECG    No new tracings- Personally Reviewed  Physical Exam   GEN: No acute distress.     Neck: No JVD Cardiac: RRR, no murmurs, rubs, or gallops.  Respiratory: Clear to auscultation bilaterally. GI: Soft, nontender, non-distended  MS: No edema; No deformity. Neuro:  Nonfocal  Psych: Normal affect   Labs    Chemistry Recent Labs  Lab 02/02/18 0721 02/03/18 0351 02/04/18 0433  NA 139 136 137  K 4.8 4.2 4.6  CL 107 101 100  CO2 25 26 27   GLUCOSE 238* 209* 210*  BUN 18 28* 29*  CREATININE 1.20* 1.88* 1.43*  CALCIUM 8.9 8.8* 9.2  ALBUMIN  --   --  3.4*  GFRNONAA 46* 27* 38*  GFRAA 54* 31* 43*  ANIONGAP 7 9 10      Hematology Recent Labs  Lab 02/02/18 0721 02/03/18 0351 02/04/18 0432  WBC 11.9* 6.4 6.5  RBC 4.01 3.56* 3.89  HGB 11.6* 10.0* 11.0*  HCT 37.9 32.2* 35.2*  MCV 94.5 90.4 90.5  MCH 28.9 28.1 28.3  MCHC 30.6 31.1 31.3  RDW 15.0 14.9 14.9  PLT 232 176 191    Cardiac Enzymes Recent Labs  Lab 02/02/18 1306 02/02/18 1853 02/03/18 0045 02/04/18 0545  TROPONINI 0.79* 1.13* 0.67* 0.20*  Recent Labs  Lab 02/02/18 0730  TROPIPOC 0.02     BNP Recent Labs  Lab 02/02/18 0721  BNP 579.0*     DDimer No results for input(s): DDIMER in the last 168 hours.   Radiology    US Renal  Result Date: 02/03/2018 CLINICAL DATA:  Acute on chronic renal failure. History of diabetes, hypertension. EXAM: RENAL / URINARY TRACT ULTRASOUND COMPLETE COMPARISON:  None. FINDINGS: Right Kidney: Renal measurements: 10.3 x 4.9 x 5.8 cm = volume: 154 mL. The renal cortical echotexture is normal. There is no hydronephrosis Left Kidney: Renal measurements: 8.1 x 4.8 x 4.8 cm = volume: 112 mL. There is limited visualization of the kidney due to increased by bowel gas. No definite parenchymal mass. No hydronephrosis. Bladder: Appears normal for degree of bladder distention. IMPRESSION: There is mild left renal atrophy. There is no hydronephrosis. No significant increase in the cortical echotexture of either kidney. Electronically Signed   By: David  Martinique M.D.    On: 02/03/2018 10:22   Dg Chest Port 1 View  Result Date: 02/03/2018 CLINICAL DATA:  Acute pulmonary edema EXAM: PORTABLE CHEST 1 VIEW COMPARISON:  02/02/2018 FINDINGS: Cardiac shadow is again enlarged. The lungs are well aerated bilaterally. Previously seen vascular congestion and increased interstitial markings have improved in the interval from the prior exam. No focal infiltrate is seen. No bony abnormality is noted. IMPRESSION: Resolution of previously seen interstitial changes and vascular congestion. Electronically Signed   By: Inez Catalina M.D.   On: 02/03/2018 11:28    Cardiac Studies   Echocardiogram: 02/02/2018 Study Conclusions  - Left ventricle: The cavity size was normal. Systolic function was mildly reduced. The estimated ejection fraction was 45%. Features are consistent with a pseudonormal left ventricular filling pattern, with concomitant abnormal relaxation and increased filling pressure (grade 2 diastolic dysfunction). Doppler parameters are consistent with high ventricular filling pressure. Mild to moderate concentric LVH. - Regional wall motion abnormality: Hypokinesis of the apical septal, apical lateral, and apical myocardium. - Aortic valve: Moderately calcified annulus. Trileaflet. There was mild regurgitation. - Mitral valve: Mildly calcified annulus. There was mild regurgitation. - Left atrium: The atrium was severely dilated. - Atrial septum: No defect or patent foramen ovale was identified. - Systemic veins: The IVC is dilated with normal respiratory variation. Estimated right atrial pressure is 8 mmHg.  Patient Profile     67 y.o. female with past medical history of HTN, HLD, Type 2 DM, COPD, and anxiety who is being seen today for the evaluation of chest pain and elevated troponin at the request of Dr. Olevia Bowens.   Assessment & Plan    1.  Non-STEMI: Symptomatically improved today.  Troponins peaked at 1.13 and most recently 0.2.   She is on IV heparin and 15 mcg/min of nitroglycerin along with aspirin, rosuvastatin 10 mg (previously intolerant of statin therapy), and carvedilol.   ACE inhibitors are on hold due to acute renal failure. Creatinine has improved to 1.43. If creatinine continues to improve, she should be able to undergo coronary angiography on 02/05/2018. Risks and benefits of cardiac catheterization have been discussed with the patient.  These include bleeding, infection, kidney damage, stroke, heart attack, death.  The patient understands these risks and is willing to proceed.  2.  Hypertensive urgency: Blood pressures have improved on carvedilol and IV nitroglycerin 50 mcg/min.  3.  Acute combined systolic and diastolic heart failure: Euvolemic after receiving IV Lasix on 02/02/2018.  Creatinine elevated so Lasix on hold and  has been given gentle IV fluid hydration.  4.  Type 2 diabetes mellitus: Currently being covered with insulin.  5.  Acute renal failure: Unclear baseline his creatinine was elevated to 1.7 approximately 1 year ago and was improved to 1.2 on 02/02/2018.  It was 1.80 yesterday and is down to 1.43 today.  Lasix on hold.  She is also receiving gentle IV fluid hydration.  6.  Hyperlipidemia: She is on Zetia 10 mg daily and was previously intolerant of statins.  We have started rosuvastatin 10 mg daily.  Lipids from 02/03/2018 reviewed with LDL 40, total cholesterol 99, triglycerides 151, and HDL 29.    For questions or updates, please contact [place] Please consult [email or web address] for contact info under Cardiology/STEMI.      Signed, Kate Sable, MD  02/04/2018, 10:44 AM

## 2018-02-04 NOTE — Progress Notes (Signed)
PROGRESS NOTE  Kristen Jones  QQV:[id number]  DOB: August 23, 1950  DOA: 02/02/2018 PCP: Lucia Gaskins, MD   Brief Admission Hx:  67 y.o. female with medical history significant of 67 year old with past medical history relevant for depression/anxiety, hypertension, type 2 diabetes, asthma/COPD, hyperlipidemia who presents with acute hypoxic respiratory failure secondary to hypertensive emergency.  MDM/Assessment & Plan:   1. Hypertensive emergency - Resolved now.  Pt has responded very well to IV nitroglycerin and blood pressures are coming down.  She has been restarted on her home BP meds for longstanding chronic hypertension.  Nephrology and cardio teams assisting with management of the hypertension.   2. Flash pulmonary edema - Pt has diuresed 2.5 liters since admission and feeling a lot better.   3. Elevated troponin - suspect demand ischemia from hypertensive emergency and pulmonary edema.  Cardiology team has been consulted.  Pt is on aspirin/heparin at this time.  4. Type 2 DM - pt is covered with sliding scale and holding home oral medications for now.  Added low dose lantus and meal coverage.  A1c is 7.6% which is poorly controlled disease.  5. Hyperlipidemia - resumed home medications.  6. Depression - resumed home paroxetine.   DVT prophylaxis: heparin Code Status: Full  Family Communication: patient updated at bedside Disposition Plan: ICU care possible cath  Procedures:  Echocardiogram Study Conclusions  - Left ventricle: The cavity size was normal. Systolic function was  mildly reduced. The estimated ejection fraction was 45%. Features   are consistent with a pseudonormal left ventricular filling pattern, with concomitant abnormal relaxation and increased   filling pressure (grade 2 diastolic dysfunction). Doppler parameters are consistent with high ventricular filling pressure.   Mild to moderate concentric LVH. - Regional wall motion abnormality: Hypokinesis of  the apical septal, apical lateral, and apical myocardium. - Aortic valve: Moderately calcified annulus. Trileaflet. There was mild regurgitation. - Mitral valve: Mildly calcified annulus. There was mild regurgitation. - Left atrium: The atrium was severely dilated. - Atrial septum: No defect or patent foramen ovale was identified. - Systemic veins: The IVC is dilated with normal respiratory variation. Estimated right atrial pressure is 8 mmHg.  Consultants:  Nephrology  Cardiology   Subjective: Pt says that she is feeling a lot better this morning.  No CP.  No SOB.    Objective: Vitals:   02/04/18 0600 02/04/18 0630 02/04/18 0747 02/04/18 0757  BP: (!) 156/71 (!) 170/75  (!) 149/68  Pulse: 77 74 75 73  Resp:      Temp:   98.3 F (36.8 C)   TempSrc:   Oral   SpO2: 94% 93% 94%   Weight:      Height:        Intake/Output Summary (Last 24 hours) at 02/04/2018 0904 Last data filed at 02/04/2018 3329 Gross per 24 hour  Intake 1692.18 ml  Output 2950 ml  Net -1257.82 ml   Filed Weights   02/03/18 0500 02/03/18 5188 02/04/18 0500  Weight: 58.6 kg 58.4 kg 58.6 kg   REVIEW OF SYSTEMS  As per history otherwise all reviewed and reported negative  Exam:  General exam: thin female awake,alert, NAD, cooperative.  Respiratory system: BBS clear. No increased work of breathing. Cardiovascular system: S1 & S2 heard, RRR. No JVD, murmurs, gallops, clicks or pedal edema. Gastrointestinal system: Abdomen is nondistended, soft and nontender. Normal bowel sounds heard. Central nervous system: Alert and oriented. No focal neurological deficits. Extremities: no CCE.  Data Reviewed: Basic Metabolic Panel:  Recent Labs  Lab 02/02/18 0721 02/02/18 1853 02/03/18 0351 02/04/18 0433  NA 139  --  136 137  K 4.8  --  4.2 4.6  CL 107  --  101 100  CO2 25  --  26 27  GLUCOSE 238*  --  209* 210*  BUN 18  --  28* 29*  CREATININE 1.20*  --  1.88* 1.43*  CALCIUM 8.9  --  8.8* 9.2  MG  --   1.6* 2.2  --   PHOS  --   --   --  3.6   Liver Function Tests: Recent Labs  Lab 02/04/18 0433  ALBUMIN 3.4*   No results for input(s): LIPASE, AMYLASE in the last 168 hours. No results for input(s): AMMONIA in the last 168 hours. CBC: Recent Labs  Lab 02/02/18 0721 02/03/18 0351 02/04/18 0432  WBC 11.9* 6.4 6.5  HGB 11.6* 10.0* 11.0*  HCT 37.9 32.2* 35.2*  MCV 94.5 90.4 90.5  PLT 232 176 191   Cardiac Enzymes: Recent Labs  Lab 02/02/18 0721 02/02/18 1306 02/02/18 1853 02/03/18 0045  TROPONINI 0.05* 0.79* 1.13* 0.67*   CBG (last 3)  Recent Labs    02/03/18 1659 02/03/18 2119 02/04/18 0746  GLUCAP 257* 223* 210*   Recent Results (from the past 240 hour(s))  MRSA PCR Screening     Status: Abnormal   Collection Time: 02/02/18  8:58 PM  Result Value Ref Range Status   MRSA by PCR POSITIVE (A) NEGATIVE Final    Comment:        The GeneXpert MRSA Assay (FDA approved for NASAL specimens only), is one component of a comprehensive MRSA colonization surveillance program. It is not intended to diagnose MRSA infection nor to guide or monitor treatment for MRSA infections. RESULT CALLED TO, READ BACK BY AND VERIFIED WITH: HEARN,J. AT 0011 ON 02/03/2018 BY EVA Performed at [facility], [address] [place], [place] [address]     Studies: US Renal  Result Date: 02/03/2018 CLINICAL DATA:  Acute on chronic renal failure. History of diabetes, hypertension. EXAM: RENAL / URINARY TRACT ULTRASOUND COMPLETE COMPARISON:  None. FINDINGS: Right Kidney: Renal measurements: 10.3 x 4.9 x 5.8 cm = volume: 154 mL. The renal cortical echotexture is normal. There is no hydronephrosis Left Kidney: Renal measurements: 8.1 x 4.8 x 4.8 cm = volume: 112 mL. There is limited visualization of the kidney due to increased by bowel gas. No definite parenchymal mass. No hydronephrosis. Bladder: Appears normal for degree of bladder distention. IMPRESSION: There is mild left renal atrophy.  There is no hydronephrosis. No significant increase in the cortical echotexture of either kidney. Electronically Signed   By: David  Martinique M.D.   On: 02/03/2018 10:22   Dg Chest Port 1 View  Result Date: 02/03/2018 CLINICAL DATA:  Acute pulmonary edema EXAM: PORTABLE CHEST 1 VIEW COMPARISON:  02/02/2018 FINDINGS: Cardiac shadow is again enlarged. The lungs are well aerated bilaterally. Previously seen vascular congestion and increased interstitial markings have improved in the interval from the prior exam. No focal infiltrate is seen. No bony abnormality is noted. IMPRESSION: Resolution of previously seen interstitial changes and vascular congestion. Electronically Signed   By: Inez Catalina M.D.   On: 02/03/2018 11:28   Scheduled Meds: . aspirin EC  81 mg Oral Daily  . carvedilol  6.25 mg Oral BID WC  . Chlorhexidine Gluconate Cloth  6 each Topical Q0600  . ezetimibe  10 mg Oral Daily  . fenofibrate  160  mg Oral Daily  . insulin aspart  0-5 Units Subcutaneous QHS  . insulin aspart  0-9 Units Subcutaneous TID WC  . insulin aspart  3 Units Subcutaneous TID WC  . insulin glargine  5 Units Subcutaneous BH-q7a  . mouth rinse  15 mL Mouth Rinse BID  . mupirocin ointment  1 application Nasal BID  . oxyCODONE  5 mg Oral TID  . pantoprazole  40 mg Oral Daily  . PARoxetine  20 mg Oral BH-q7a  . rosuvastatin  10 mg Oral q1800  . sodium chloride flush  3 mL Intravenous Q12H   Continuous Infusions: . sodium chloride 75 mL/hr at 02/04/18 0647  . sodium chloride    . heparin 900 Units/hr (02/04/18 0831)  . nitroGLYCERIN 10 mcg/min (02/04/18 8727)    Principal Problem:   Flash pulmonary edema (HCC) Active Problems:   T2DM (type 2 diabetes mellitus) (HCC)   HLD (hyperlipidemia)   Essential hypertension   Asthma   GASTROESOPHAGEAL REFLUX DISEASE, CHRONIC   Depression with anxiety   Hypertensive emergency  Critical Care Time spent: 29 minutes  Irwin Brakeman, MD, FAAFP [facility] Pager [phone]  If 7PM-7AM, please contact night-coverage [email or web address] Password [email or web address] 02/04/2018, 9:04 AM    LOS: 2 days  '

## 2018-02-04 NOTE — Progress Notes (Signed)
Kristen Jones for heparin dosing Indication: chest pain/ACS  Allergies  Allergen Reactions  . Fluoxetine Hcl     REACTION: UNKNOWN REACTION  . Methocarbamol Nausea And Vomiting  . Metoclopramide Hcl     REACTION: UNKNOWN REACTION  . Motrin [Ibuprofen] Other (See Comments)    REACTION UNKNOWN  . Rabeprazole Sodium     REACTION: UNKNOWN REACTION    Patient Measurements: Height: 4\' 10"  (147.3 cm) Weight: 129 lb 3 oz (58.6 kg) IBW/kg (Calculated) : 40.9 Heparin Dosing Weight:  HEPARIN DW (KG): 53.4    Vital Signs: Temp: 98.3 F (36.8 C) (11/13 0747) Temp Source: Oral (11/13 0747) BP: 149/68 (11/13 0757) Pulse Rate: 73 (11/13 0757)  Labs: Recent Labs    02/02/18 0721 02/02/18 1306 02/02/18 1853 02/02/18 2041 02/03/18 0045 02/03/18 0351 02/03/18 0352 02/04/18 0432 02/04/18 0433  HGB 11.6*  --   --   --   --  10.0*  --  11.0*  --   HCT 37.9  --   --   --   --  32.2*  --  35.2*  --   PLT 232  --   --   --   --  176  --  191  --   LABPROT  --   --   --  13.2  --   --   --   --   --   INR  --   --   --  1.01  --   --   --   --   --   HEPARINUNFRC  --   --   --  0.36  --   --  0.42  --  0.10*  CREATININE 1.20*  --   --   --   --  1.88*  --   --  1.43*  TROPONINI 0.05* 0.79* 1.13*  --  0.67*  --   --   --   --     Estimated Creatinine Clearance: 29.7 mL/min (A) (by C-G formula based on SCr of 1.43 mg/dL (H)).   Medical History: Past Medical History:  Diagnosis Date  . Arthritis   . COPD (chronic obstructive pulmonary disease) ([place])   . Depression with anxiety 02/02/2018  . Diabetes mellitus   . High cholesterol   . Hypertension   . Mental disorder    Assessment:   Pharmacy consulted to dose heparin for this 89 yof with chest pain/ACS.   Heparin level subtherapeutic at 0.10 with no stop in heparin per RN.   Goal of Therapy:  Heparin level 0.3-0.7 units/ml Monitor platelets by anticoagulation protocol: Yes   Plan:  1500  units bolus x 1 Increase heparin infusion to 900 units/hr Check anti-Xa level in 6-8 hours and daily while on heparin. Continue to monitor H&H and platelets.   Margot Ables, PharmD Clinical Pharmacist 02/04/2018 8:20 AM

## 2018-02-04 NOTE — Progress Notes (Signed)
Kristen Jones  MRN: [id number]  DOB/AGE: 1950/05/09 67 y.o.  Primary Care Physician:Dondiego, Delfino Lovett, MD  Admit date: 02/02/2018  Chief Complaint:  Chief Complaint  Patient presents with  . Shortness of Breath    S-Pt presented on  02/02/2018 with  Chief Complaint  Patient presents with  . Shortness of Breath  .    Pt today feels better.   Pt main concern was " I think I need something for my heart"    Meds . aspirin EC  81 mg Oral Daily  . carvedilol  6.25 mg Oral BID WC  . Chlorhexidine Gluconate Cloth  6 each Topical Q0600  . ezetimibe  10 mg Oral Daily  . fenofibrate  160 mg Oral Daily  . insulin aspart  0-5 Units Subcutaneous QHS  . insulin aspart  0-9 Units Subcutaneous TID WC  . insulin aspart  3 Units Subcutaneous TID WC  . insulin glargine  5 Units Subcutaneous BH-q7a  . mouth rinse  15 mL Mouth Rinse BID  . mupirocin ointment  1 application Nasal BID  . oxyCODONE  5 mg Oral TID  . pantoprazole  40 mg Oral Daily  . PARoxetine  20 mg Oral BH-q7a  . rosuvastatin  10 mg Oral q1800  . sodium chloride flush  3 mL Intravenous Q12H       Physical Exam: Vital signs in last 24 hours: Temp:  [98 F (36.7 C)-98.9 F (37.2 C)] 98.3 F (36.8 C) (11/13 0747) Pulse Rate:  [69-85] 80 (11/13 0900) Resp:  [18-29] 29 (11/13 0900) BP: (110-170)/(54-127) 140/62 (11/13 0900) SpO2:  [90 %-97 %] 97 % (11/13 0900) Weight:  [58.6 kg] 58.6 kg (11/13 0500) Weight change: 0 kg Last BM Date: 02/01/18  Intake/Output from previous day: 11/12 0701 - 11/13 0700 In: 1932.2 [P.O.:480; I.V.:1452.2] Out: 2950 [Urine:2950] Total I/O In: 239.7 [I.V.:239.7] Out: -    Physical Exam: General- pt is awake,alert, oriented to time place and person Resp- No acute REsp distress, CTA B/L NO Rhonchi CVS- S1S2 regular in rate and rhythm GIT- BS+, soft, NT, ND EXT- NO LE Edema, Cyanosis   Lab Results: CBC Recent Labs    02/03/18 0351 02/04/18 0432  WBC 6.4 6.5  HGB 10.0*  11.0*  HCT 32.2* 35.2*  PLT 176 191    BMET Recent Labs    02/03/18 0351 02/04/18 0433  NA 136 137  K 4.2 4.6  CL 101 100  CO2 26 27  GLUCOSE 209* 210*  BUN 28* 29*  CREATININE 1.88* 1.43*  CALCIUM 8.8* 9.2   Creat trend 2019   1.2--1.8 2018   1.7 2009   1.3--1.8 2008   1.3   MICRO Recent Results (from the past 240 hour(s))  MRSA PCR Screening     Status: Abnormal   Collection Time: 02/02/18  8:58 PM  Result Value Ref Range Status   MRSA by PCR POSITIVE (A) NEGATIVE Final    Comment:        The GeneXpert MRSA Assay (FDA approved for NASAL specimens only), is one component of a comprehensive MRSA colonization surveillance program. It is not intended to diagnose MRSA infection nor to guide or monitor treatment for MRSA infections. RESULT CALLED TO, READ BACK BY AND VERIFIED WITH: HEARN,J. AT 0011 ON 02/03/2018 BY EVA Performed at [facility], [address], [place], [place] [address]       Lab Results  Component Value Date   CALCIUM 9.2 02/04/2018   PHOS 3.6 02/04/2018  Impression: 1)Renal AKI vs CKD               AKI sec to ATN               AKI on CKD               CKD stage 3 .               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  2)HTN  Medication-  On Alpha and beta Blockers  3)Anemia HGb at goal (9--11)   4)CKD Mineral-Bone Disorder PTH not avail Phosphorus will check. Calcium low( will check albumin)  5)CAD-admitted with NSTEMI Cardiology andPrimary MD following  6)Electrolytes Normokalemic NOrmonatremic   7)Acid base Co2 at goal     Plan:  Will continue current tx Will ask for CKD-BMd work up Will ask for albumin    Shemicka Cohrs S 02/04/2018, 10:43 AM

## 2018-02-04 NOTE — Progress Notes (Signed)
Report given to [facility] and to RN on [facility]. Pt resting comfortably in chair on carelinks arrival. Pumps swapped out. Pt VSS. All possessions sent with patient. Pt transferred with no problems.

## 2018-02-04 NOTE — Care Management Note (Signed)
Case Management Note  Patient Details  Name: Kristen Jones MRN: [id number] Date of Birth: 1950-05-30  Subjective/Objective:  CM consulted for CHF. Chart reviewed. Per notes, low suspicion for heart failure, ? Flash pulmonary edema related to hypertensive urgency.              Action/Plan: Patient transferring to have a cardiac catheterization.   Expected Discharge Date:   unk               Expected Discharge Plan:     In-House Referral:     Discharge planning Services  CM Consult  Post Acute Care Choice:    Choice offered to:     DME Arranged:    DME Agency:     HH Arranged:    HH Agency:     Status of Service:  In process, will continue to follow  If discussed at Long Length of Stay Meetings, dates discussed:    Additional Comments:  Roderick Calo, Chauncey Reading, RN 02/04/2018, 1:26 PM

## 2018-02-04 NOTE — Progress Notes (Signed)
[place] for heparin dosing Indication: chest pain/ACS  Allergies  Allergen Reactions  . Fluoxetine Hcl     REACTION: UNKNOWN REACTION  . Methocarbamol Nausea And Vomiting  . Metoclopramide Hcl     REACTION: UNKNOWN REACTION  . Motrin [Ibuprofen] Other (See Comments)    REACTION UNKNOWN  . Rabeprazole Sodium     REACTION: UNKNOWN REACTION    Patient Measurements: Height: 4\' 10"  (147.3 cm) Weight: 129 lb 3 oz (58.6 kg) IBW/kg (Calculated) : 40.9 Heparin Dosing Weight:  HEPARIN DW (KG): 53.4    Vital Signs: Temp: 99 F (37.2 C) (11/13 1623) Temp Source: Oral (11/13 1623) BP: 139/61 (11/13 1845) Pulse Rate: 78 (11/13 1845)  Labs: Recent Labs    02/02/18 0721  02/02/18 1853  02/02/18 2041 02/03/18 0045 02/03/18 0351 02/03/18 0352 02/04/18 0432 02/04/18 0433 02/04/18 0545 02/04/18 1536  HGB 11.6*  --   --   --   --   --  10.0*  --  11.0*  --   --   --   HCT 37.9  --   --   --   --   --  32.2*  --  35.2*  --   --   --   PLT 232  --   --   --   --   --  176  --  191  --   --   --   LABPROT  --   --   --   --  13.2  --   --   --   --   --   --   --   INR  --   --   --   --  1.01  --   --   --   --   --   --   --   HEPARINUNFRC  --   --   --    < > 0.36  --   --  0.42  --  0.10*  --  0.24*  CREATININE 1.20*  --   --   --   --   --  1.88*  --   --  1.43*  --   --   TROPONINI 0.05*   < > 1.13*  --   --  0.67*  --   --   --   --  0.20*  --    < > = values in this interval not displayed.    Estimated Creatinine Clearance: 29.7 mL/min (A) (by C-G formula based on SCr of 1.43 mg/dL (H)).   Medical History: Past Medical History:  Diagnosis Date  . Arthritis   . COPD (chronic obstructive pulmonary disease) ([place])   . Depression with anxiety 02/02/2018  . Diabetes mellitus   . High cholesterol   . Hypertension   . Mental disorder    Assessment:   Pharmacy consulted to dose heparin for this 35 yof with chest pain/ACS.    Heparin level subtherapeutic at 0.24 with no stop in heparin per RN.   Goal of Therapy:  Heparin level 0.3-0.7 units/ml Monitor platelets by anticoagulation protocol: Yes   Plan:  800 units bolus x 1 Increase heparin infusion to 1000 units/hr Check anti-Xa level in 6-8 hours and daily while on heparin. Continue to monitor H&H and platelets.   Margot Ables, PharmD Clinical Pharmacist 02/04/2018 7:14 PM

## 2018-02-04 NOTE — Progress Notes (Signed)
Pt arrived to the floor, alert and oriented ambulated to the bath room and then bed.. CHG bath performed, heart monitor applied, Pt oriented to room and equipment. Call bell and phone within reach.

## 2018-02-04 NOTE — Progress Notes (Signed)
Inpatient Diabetes Program Recommendations  AACE/ADA: New Consensus Statement on Inpatient Glycemic Control (2015)  Target Ranges:  Prepandial:   less than 140 mg/dL      Peak postprandial:   less than 180 mg/dL (1-2 hours)      Critically ill patients:  140 - 180 mg/dL   Results for Kristen Jones, Kristen Jones (MRN [id number]) as of 02/04/2018 07:59  Ref. Range 02/03/2018 08:23 02/03/2018 11:58 02/03/2018 16:59 02/03/2018 21:19  Glucose-Capillary Latest Ref Range: 70 - 99 mg/dL 191 (H)  2 units NOVOLOG  228 (H)  3 units NOVOLOG  257 (H)  5 units NOVOLOG  223 (H)  2 units NOVOLOG    Results for Kristen Jones, Kristen Jones (MRN [id number]) as of 02/04/2018 07:59  Ref. Range 02/04/2018 07:46  Glucose-Capillary Latest Ref Range: 70 - 99 mg/dL 210 (H)  3 units NOVOLOG    Results for Kristen Jones, Kristen Jones (MRN [id number]) as of 02/04/2018 07:59  Ref. Range 02/03/2018 10:36  Hemoglobin A1C Latest Ref Range: 4.8 - 5.6 % 7.6 (H)    Home DM Meds: Farxiga 10 mg Daily       Metformin 500 mg BID  Current Orders: Novolog Sensitive Correction Scale/ SSI (0-9 units) TID AC + HS      MD- Please consider the following in-hospital insulin adjustments while patient's home oral meds are on hold and patient having glucose elevations:  1. Start low dose basal insulin: Lantus 5 units Daily (~0.1 units/kg based on weight of 58 kg)  2. Start low dose Novolog Meal Coverage: Novolog 3 units TID with meals  (Please add the following Hold Parameters: Hold if pt eats <50% of meal, Hold if pt NPO)     --Will follow patient during hospitalization--  Wyn Quaker RN, MSN, CDE Diabetes Coordinator Inpatient Glycemic Control Team Team Pager: [phone] (8a-5p)

## 2018-02-04 NOTE — Progress Notes (Signed)
Nurse to Nurse report called over to [facility] on [facility] at [facility]. Patient alert and oriented x4. No complaints of pain, shortness of breath, chest pain, dizziness, nausea or vomiting. Patient up out of bed to bedside commode with stand-by assist. Patient tolerated PO meds and diet well. Appetite good. IV patent and flushing well. Patient denies any pain or discomfort with IV. IV heparin, Nitro and 1/2NS continue to infuse per orders.

## 2018-02-04 NOTE — H&P (View-Only) (Signed)
Progress Note  Patient Name: Kristen Jones Date of Encounter: 02/04/2018  Primary Cardiologist: New to [facility] - Dr. Bronson Ing  Subjective   Patient is doing well this morning and denies chest pain, palpitations, and shortness of breath.  Inpatient Medications    Scheduled Meds: . aspirin EC  81 mg Oral Kristen  . carvedilol  6.25 mg Oral BID WC  . Chlorhexidine Gluconate Cloth  6 each Topical Q0600  . ezetimibe  10 mg Oral Kristen  . fenofibrate  160 mg Oral Kristen  . insulin aspart  0-5 Units Subcutaneous QHS  . insulin aspart  0-9 Units Subcutaneous TID WC  . insulin aspart  3 Units Subcutaneous TID WC  . insulin glargine  5 Units Subcutaneous BH-q7a  . mouth rinse  15 mL Mouth Rinse BID  . mupirocin ointment  1 application Nasal BID  . oxyCODONE  5 mg Oral TID  . pantoprazole  40 mg Oral Kristen  . PARoxetine  20 mg Oral BH-q7a  . rosuvastatin  10 mg Oral q1800  . sodium chloride flush  3 mL Intravenous Q12H   Continuous Infusions: . sodium chloride 75 mL/hr at 02/04/18 0923  . sodium chloride    . heparin 900 Units/hr (02/04/18 0923)  . nitroGLYCERIN 15 mcg/min (02/04/18 0923)   PRN Meds: sodium chloride, acetaminophen, ondansetron (ZOFRAN) IV, sodium chloride flush   Vital Signs    Vitals:   02/04/18 0747 02/04/18 0757 02/04/18 0800 02/04/18 0900  BP:  (!) 149/68 (!) 147/85 140/62  Pulse: 75 73 77 80  Resp:    (!) 29  Temp: 98.3 F (36.8 C)     TempSrc: Oral     SpO2: 94%  94% 97%  Weight:      Height:        Intake/Output Summary (Last 24 hours) at 02/04/2018 1044 Last data filed at 02/04/2018 0923 Gross per 24 hour  Intake 1894.59 ml  Output 2950 ml  Net -1055.41 ml   Filed Weights   02/03/18 0500 02/03/18 0632 02/04/18 0500  Weight: 58.6 kg 58.4 kg 58.6 kg    Telemetry    Sinus rhythm with PACs and paroxysmal atrial runs- Personally Reviewed  ECG    No new tracings- Personally Reviewed  Physical Exam   GEN: No acute distress.     Neck: No JVD Cardiac: RRR, no murmurs, rubs, or gallops.  Respiratory: Clear to auscultation bilaterally. GI: Soft, nontender, non-distended  MS: No edema; No deformity. Neuro:  Nonfocal  Psych: Normal affect   Labs    Chemistry Recent Labs  Lab 02/02/18 0721 02/03/18 0351 02/04/18 0433  NA 139 136 137  K 4.8 4.2 4.6  CL 107 101 100  CO2 25 26 27   GLUCOSE 238* 209* 210*  BUN 18 28* 29*  CREATININE 1.20* 1.88* 1.43*  CALCIUM 8.9 8.8* 9.2  ALBUMIN  --   --  3.4*  GFRNONAA 46* 27* 38*  GFRAA 54* 31* 43*  ANIONGAP 7 9 10      Hematology Recent Labs  Lab 02/02/18 0721 02/03/18 0351 02/04/18 0432  WBC 11.9* 6.4 6.5  RBC 4.01 3.56* 3.89  HGB 11.6* 10.0* 11.0*  HCT 37.9 32.2* 35.2*  MCV 94.5 90.4 90.5  MCH 28.9 28.1 28.3  MCHC 30.6 31.1 31.3  RDW 15.0 14.9 14.9  PLT 232 176 191    Cardiac Enzymes Recent Labs  Lab 02/02/18 1306 02/02/18 1853 02/03/18 0045 02/04/18 0545  TROPONINI 0.79* 1.13* 0.67* 0.20*  Recent Labs  Lab 02/02/18 0730  TROPIPOC 0.02     BNP Recent Labs  Lab 02/02/18 0721  BNP 579.0*     DDimer No results for input(s): DDIMER in the last 168 hours.   Radiology    US Renal  Result Date: 02/03/2018 CLINICAL DATA:  Acute on chronic renal failure. History of diabetes, hypertension. EXAM: RENAL / URINARY TRACT ULTRASOUND COMPLETE COMPARISON:  None. FINDINGS: Right Kidney: Renal measurements: 10.3 x 4.9 x 5.8 cm = volume: 154 mL. The renal cortical echotexture is normal. There is no hydronephrosis Left Kidney: Renal measurements: 8.1 x 4.8 x 4.8 cm = volume: 112 mL. There is limited visualization of the kidney due to increased by bowel gas. No definite parenchymal mass. No hydronephrosis. Bladder: Appears normal for degree of bladder distention. IMPRESSION: There is mild left renal atrophy. There is no hydronephrosis. No significant increase in the cortical echotexture of either kidney. Electronically Signed   By: David  Martinique M.D.    On: 02/03/2018 10:22   Dg Chest Port 1 View  Result Date: 02/03/2018 CLINICAL DATA:  Acute pulmonary edema EXAM: PORTABLE CHEST 1 VIEW COMPARISON:  02/02/2018 FINDINGS: Cardiac shadow is again enlarged. The lungs are well aerated bilaterally. Previously seen vascular congestion and increased interstitial markings have improved in the interval from the prior exam. No focal infiltrate is seen. No bony abnormality is noted. IMPRESSION: Resolution of previously seen interstitial changes and vascular congestion. Electronically Signed   By: Inez Catalina M.D.   On: 02/03/2018 11:28    Cardiac Studies   Echocardiogram: 02/02/2018 Study Conclusions  - Left ventricle: The cavity size was normal. Systolic function was mildly reduced. The estimated ejection fraction was 45%. Features are consistent with a pseudonormal left ventricular filling pattern, with concomitant abnormal relaxation and increased filling pressure (grade 2 diastolic dysfunction). Doppler parameters are consistent with high ventricular filling pressure. Mild to moderate concentric LVH. - Regional wall motion abnormality: Hypokinesis of the apical septal, apical lateral, and apical myocardium. - Aortic valve: Moderately calcified annulus. Trileaflet. There was mild regurgitation. - Mitral valve: Mildly calcified annulus. There was mild regurgitation. - Left atrium: The atrium was severely dilated. - Atrial septum: No defect or patent foramen ovale was identified. - Systemic veins: The IVC is dilated with normal respiratory variation. Estimated right atrial pressure is 8 mmHg.  Patient Profile     67 y.o. female with past medical history of HTN, HLD, Type 2 DM, COPD, and anxiety who is being seen today for the evaluation of chest pain and elevated troponin at the request of Dr. Olevia Bowens.   Assessment & Plan    1.  Non-STEMI: Symptomatically improved today.  Troponins peaked at 1.13 and most recently 0.2.   She is on IV heparin and 15 mcg/min of nitroglycerin along with aspirin, rosuvastatin 10 mg (previously intolerant of statin therapy), and carvedilol.   ACE inhibitors are on hold due to acute renal failure. Creatinine has improved to 1.43. If creatinine continues to improve, she should be able to undergo coronary angiography on 02/05/2018. Risks and benefits of cardiac catheterization have been discussed with the patient.  These include bleeding, infection, kidney damage, stroke, heart attack, death.  The patient understands these risks and is willing to proceed.  2.  Hypertensive urgency: Blood pressures have improved on carvedilol and IV nitroglycerin 50 mcg/min.  3.  Acute combined systolic and diastolic heart failure: Euvolemic after receiving IV Lasix on 02/02/2018.  Creatinine elevated so Lasix on hold and  has been given gentle IV fluid hydration.  4.  Type 2 diabetes mellitus: Currently being covered with insulin.  5.  Acute renal failure: Unclear baseline his creatinine was elevated to 1.7 approximately 1 year ago and was improved to 1.2 on 02/02/2018.  It was 1.80 yesterday and is down to 1.43 today.  Lasix on hold.  She is also receiving gentle IV fluid hydration.  6.  Hyperlipidemia: She is on Zetia 10 mg Kristen and was previously intolerant of statins.  We have started rosuvastatin 10 mg Kristen.  Lipids from 02/03/2018 reviewed with LDL 40, total cholesterol 99, triglycerides 151, and HDL 29.    For questions or updates, please contact [place] Please consult [email or web address] for contact info under Cardiology/STEMI.      Signed, Kate Sable, MD  02/04/2018, 10:44 AM

## 2018-02-05 ENCOUNTER — Encounter (HOSPITAL_COMMUNITY): Payer: Self-pay | Admitting: Cardiovascular Disease

## 2018-02-05 ENCOUNTER — Encounter (HOSPITAL_COMMUNITY)
Admission: EM | Disposition: A | Discharge status: Home / Self Care | Payer: Self-pay | Source: Home / Self Care | Attending: Family Medicine

## 2018-02-05 DIAGNOSIS — R778 Other specified abnormalities of plasma proteins: Secondary | ICD-10-CM

## 2018-02-05 DIAGNOSIS — R7989 Other specified abnormal findings of blood chemistry: Secondary | ICD-10-CM

## 2018-02-05 DIAGNOSIS — I2510 Atherosclerotic heart disease of native coronary artery without angina pectoris: Secondary | ICD-10-CM

## 2018-02-05 HISTORY — PX: LEFT HEART CATH AND CORONARY ANGIOGRAPHY: CATH118249

## 2018-02-05 LAB — CBC
HEMATOCRIT: 31.3 % — AB (ref 36.0–46.0)
HEMOGLOBIN: 9.7 g/dL — AB (ref 12.0–15.0)
MCH: 27.8 pg (ref 26.0–34.0)
MCHC: 31 g/dL (ref 30.0–36.0)
MCV: 89.7 fL (ref 80.0–100.0)
NRBC: 0 % (ref 0.0–0.2)
Platelets: 186 10*3/uL (ref 150–400)
RBC: 3.49 MIL/uL — AB (ref 3.87–5.11)
RDW: 14.5 % (ref 11.5–15.5)
WBC: 6.5 10*3/uL (ref 4.0–10.5)

## 2018-02-05 LAB — BASIC METABOLIC PANEL
Anion gap: 8 (ref 5–15)
BUN: 26 mg/dL — ABNORMAL HIGH (ref 8–23)
CHLORIDE: 101 mmol/L (ref 98–111)
CO2: 24 mmol/L (ref 22–32)
CREATININE: 1.26 mg/dL — AB (ref 0.44–1.00)
Calcium: 8.7 mg/dL — ABNORMAL LOW (ref 8.9–10.3)
GFR, EST AFRICAN AMERICAN: 51 mL/min — AB (ref >60)
GFR, EST NON AFRICAN AMERICAN: 44 mL/min — AB (ref >60)
GLUCOSE: 223 mg/dL — AB (ref 70–99)
Potassium: 4.5 mmol/L (ref 3.5–5.1)
Sodium: 133 mmol/L — ABNORMAL LOW (ref 135–145)

## 2018-02-05 LAB — GLUCOSE, CAPILLARY
Glucose-Capillary: 191 mg/dL — ABNORMAL HIGH (ref 70–99)
Glucose-Capillary: 136 mg/dL — ABNORMAL HIGH (ref 70–99)
Glucose-Capillary: 149 mg/dL — ABNORMAL HIGH (ref 70–99)
Glucose-Capillary: 170 mg/dL — ABNORMAL HIGH (ref 70–99)

## 2018-02-05 LAB — VITAMIN D 25 HYDROXY (VIT D DEFICIENCY, FRACTURES): Vit D, 25-Hydroxy: 19.3 ng/mL — ABNORMAL LOW (ref 30.0–100.0)

## 2018-02-05 LAB — HEPARIN LEVEL (UNFRACTIONATED)
HEPARIN UNFRACTIONATED: 0.19 [IU]/mL — AB (ref 0.30–0.70)
Heparin Unfractionated: 0.22 IU/mL — ABNORMAL LOW (ref 0.30–0.70)

## 2018-02-05 LAB — PHOSPHORUS: PHOSPHORUS: 3.5 mg/dL (ref 2.5–4.6)

## 2018-02-05 SURGERY — LEFT HEART CATH AND CORONARY ANGIOGRAPHY
Anesthesia: LOCAL

## 2018-02-05 MED ORDER — MIDAZOLAM HCL 2 MG/2ML IJ SOLN
INTRAMUSCULAR | Status: DC | PRN
  Administered 2018-02-05: 2 mg via INTRAVENOUS

## 2018-02-05 MED ORDER — HEPARIN (PORCINE) IN NACL 1000-0.9 UT/500ML-% IV SOLN
INTRAVENOUS | Status: DC | PRN
  Administered 2018-02-05: 500 mL

## 2018-02-05 MED ORDER — HEPARIN (PORCINE) IN NACL 1000-0.9 UT/500ML-% IV SOLN
INTRAVENOUS | Status: AC
  Filled 2018-02-05: qty 500

## 2018-02-05 MED ORDER — FENTANYL CITRATE (PF) 100 MCG/2ML IJ SOLN
INTRAMUSCULAR | Status: DC | PRN
  Administered 2018-02-05: 50 ug via INTRAVENOUS

## 2018-02-05 MED ORDER — SODIUM CHLORIDE 0.9% FLUSH
3.0000 mL | Freq: Two times a day (BID) | INTRAVENOUS | Status: DC
  Administered 2018-02-05 – 2018-02-06 (×2): 3 mL via INTRAVENOUS

## 2018-02-05 MED ORDER — MIDAZOLAM HCL 2 MG/2ML IJ SOLN
INTRAMUSCULAR | Status: AC
  Filled 2018-02-05: qty 2

## 2018-02-05 MED ORDER — FENTANYL CITRATE (PF) 100 MCG/2ML IJ SOLN
INTRAMUSCULAR | Status: AC
  Filled 2018-02-05: qty 2

## 2018-02-05 MED ORDER — CARVEDILOL 12.5 MG PO TABS
12.5000 mg | ORAL_TABLET | Freq: Two times a day (BID) | ORAL | Status: DC
  Administered 2018-02-05 – 2018-02-06 (×3): 12.5 mg via ORAL
  Filled 2018-02-05 (×3): qty 1

## 2018-02-05 MED ORDER — HEPARIN SODIUM (PORCINE) 1000 UNIT/ML IJ SOLN
INTRAMUSCULAR | Status: DC | PRN
  Administered 2018-02-05: 4000 [IU] via INTRAVENOUS

## 2018-02-05 MED ORDER — VERAPAMIL HCL 2.5 MG/ML IV SOLN
INTRAVENOUS | Status: DC | PRN
  Administered 2018-02-05: 10 mL via INTRA_ARTERIAL

## 2018-02-05 MED ORDER — IOHEXOL 350 MG/ML SOLN
INTRAVENOUS | Status: DC | PRN
  Administered 2018-02-05: 70 mL via INTRA_ARTERIAL

## 2018-02-05 MED ORDER — LIDOCAINE HCL (PF) 1 % IJ SOLN
INTRAMUSCULAR | Status: DC | PRN
  Administered 2018-02-05: 2 mL

## 2018-02-05 MED ORDER — SODIUM CHLORIDE 0.9 % IV SOLN: 250.0000 mL | INTRAVENOUS | Status: DC | PRN

## 2018-02-05 MED ORDER — LIDOCAINE HCL (PF) 1 % IJ SOLN
INTRAMUSCULAR | Status: AC
  Filled 2018-02-05: qty 30

## 2018-02-05 MED ORDER — SODIUM CHLORIDE 0.9 % IV SOLN
INTRAVENOUS | Status: AC
  Administered 2018-02-05: 12:00:00 via INTRAVENOUS

## 2018-02-05 MED ORDER — VERAPAMIL HCL 2.5 MG/ML IV SOLN
INTRAVENOUS | Status: AC
  Filled 2018-02-05: qty 2

## 2018-02-05 MED ORDER — SODIUM CHLORIDE 0.9% FLUSH: 3.0000 mL | INTRAVENOUS | Status: DC | PRN

## 2018-02-05 MED ORDER — AMLODIPINE BESYLATE 5 MG PO TABS
5.0000 mg | ORAL_TABLET | Freq: Every day | ORAL | Status: DC
  Administered 2018-02-05: 5 mg via ORAL
  Filled 2018-02-05: qty 1

## 2018-02-05 SURGICAL SUPPLY — 10 items
CATH 5FR JL3.5 JR4 ANG PIG MP (CATHETERS) ×2 IMPLANT
DEVICE RAD COMP TR BAND LRG (VASCULAR PRODUCTS) ×2 IMPLANT
GLIDESHEATH SLEND SS 6F .021 (SHEATH) ×2 IMPLANT
GUIDEWIRE INQWIRE 1.5J.035X260 (WIRE) ×1 IMPLANT
INQWIRE 1.5J .035X260CM (WIRE) ×2
KIT HEART LEFT (KITS) ×2 IMPLANT
PACK CARDIAC CATHETERIZATION (CUSTOM PROCEDURE TRAY) ×2 IMPLANT
TRANSDUCER W/STOPCOCK (MISCELLANEOUS) ×2 IMPLANT
TUBING CIL FLEX 10 FLL-RA (TUBING) ×2 IMPLANT
WIRE HI TORQ VERSACORE-J 145CM (WIRE) ×2 IMPLANT

## 2018-02-05 NOTE — Discharge Instructions (Signed)

## 2018-02-05 NOTE — Interval H&P Note (Signed)
History and Physical Interval Note:  02/05/2018 10:55 AM  Kristen Jones  has presented today for cardiac cath with the diagnosis of NSTEMI. The various methods of treatment have been discussed with the patient and family. After consideration of risks, benefits and other options for treatment, the patient has consented to  Procedure(s): LEFT HEART CATH AND CORONARY ANGIOGRAPHY (N/A) as a surgical intervention .  The patient's history has been reviewed, patient examined, no change in status, stable for surgery.  I have reviewed the patient's chart and labs.  Questions were answered to the patient's satisfaction.   Cath Lab Visit (complete for each Cath Lab visit)  Clinical Evaluation Leading to the Procedure:   ACS: Yes.    Non-ACS:    Anginal Classification: CCS III  Anti-ischemic medical therapy: Minimal Therapy (1 class of medications)  Non-Invasive Test Results: No non-invasive testing performed  Prior CABG: No previous CABG          Lauree Chandler

## 2018-02-05 NOTE — Plan of Care (Signed)
  Problem: Education: Goal: Knowledge of General Education information will improve Description Including pain rating scale, medication(s)/side effects and non-pharmacologic comfort measures Outcome: Progressing   Problem: Health Behavior/Discharge Planning: Goal: Ability to manage health-related needs will improve Outcome: Progressing   Problem: Clinical Measurements: Goal: Ability to maintain clinical measurements within normal limits will improve Outcome: Progressing Goal: Will remain free from infection Outcome: Progressing Goal: Diagnostic test results will improve Outcome: Progressing Goal: Respiratory complications will improve Outcome: Progressing Goal: Cardiovascular complication will be avoided Outcome: Progressing   Problem: Activity: Goal: Risk for activity intolerance will decrease Outcome: Progressing   Problem: Coping: Goal: Level of anxiety will decrease Outcome: Progressing   Problem: Elimination: Goal: Will not experience complications related to bowel motility Outcome: Progressing Goal: Will not experience complications related to urinary retention Outcome: Progressing   Problem: Pain Managment: Goal: General experience of comfort will improve Outcome: Progressing   Problem: Safety: Goal: Ability to remain free from injury will improve Outcome: Progressing   Problem: Education: Goal: Ability to demonstrate management of disease process will improve Outcome: Progressing Goal: Ability to verbalize understanding of medication therapies will improve Outcome: Progressing   Problem: Activity: Goal: Capacity to carry out activities will improve Outcome: Progressing   Problem: Cardiac: Goal: Ability to achieve and maintain adequate cardiopulmonary perfusion will improve Outcome: Progressing   Problem: Education: Goal: Knowledge of disease or condition will improve Outcome: Progressing   Problem: Activity: Goal: Ability to tolerate increased  activity will improve Outcome: Progressing   Problem: Respiratory: Goal: Ability to maintain a clear airway will improve Outcome: Progressing Goal: Levels of oxygenation will improve Outcome: Progressing Goal: Ability to maintain adequate ventilation will improve Outcome: Progressing   Problem: Education: Goal: Understanding of CV disease, CV risk reduction, and recovery process will improve Outcome: Progressing   Problem: Activity: Goal: Ability to return to baseline activity level will improve Outcome: Progressing   Problem: Cardiovascular: Goal: Ability to achieve and maintain adequate cardiovascular perfusion will improve Outcome: Progressing Goal: Vascular access site(s) Level 0-1 will be maintained Outcome: Progressing

## 2018-02-05 NOTE — Progress Notes (Signed)
[facility] PROGRESS NOTE  Kristen Jones INO:[id number] DOB: 1950-04-25 DOA: 02/02/2018 PCP: Lucia Gaskins, MD  Brief summary   67 y.o.femalewith medical history significant of87 year old with past medical history relevant for depression/anxiety, hypertension, type 2 diabetes, asthma/COPD, hyperlipidemia who presents with acute hypoxic respiratory failure secondary to hypertensive emergency.   Assessment/Plan:  NSTEMI. Elevated troponin - suspect demand ischemia from hypertensive emergency and pulmonary edema. Cardiology team has been consulted.  Pt is on aspirin/heparin at this time. Cont bb, statin.  Awaiting LHC  Hypertensive emergency. Initially, on IV nitroglycerin and blood pressures are coming down.  She has been restarted on her home BP meds for longstanding chronic hypertension. Cont BB. Will add lisinopril if renal function remains stable    Acute CHF. Echo. LVEF 09%, diastolic dysfunction II. Flash pulmonary edema on admission. Pt has diuresed well since admission and feeling a lot better.  cont BB, will need chronic diuresis at discharge. Will consider ACE/ARB if renal is stable   Type 2 DM - pt is covered with sliding scale and holding home oral medications for now.  Added low dose lantus and meal coverage.  A1c is 7.6% which is poorly controlled disease.   Hyperlipidemia - resumed home medications.   Depression - resumed home paroxetine.    Code Status: full Family Communication: d/w patient, RN(indicate person spoken with, relationship, and if by phone, the number) Disposition Plan: pend LHC   Consultants:  Cardiology   Procedures: Procedures:  Echocardiogram Study Conclusions  - Left ventricle: The cavity size was normal. Systolic function wasmildly reduced. The estimated ejection fraction was 45%. Features are consistent with a pseudonormal left ventricular fillingpattern, with concomitant abnormal relaxation and increased filling  pressure (grade 2 diastolic dysfunction). Dopplerparameters are consistent with high ventricular filling pressure. Mild to moderate concentric LVH. - Regional wall motion abnormality: Hypokinesis of the apicalseptal, apical lateral, and apical myocardium. - Aortic valve: Moderately calcified annulus. Trileaflet. There wasmild regurgitation. - Mitral valve: Mildly calcified annulus. There was mildregurgitation. - Left atrium: The atrium was severely dilated. - Atrial septum: No defect or patent foramen ovale was identified. - Systemic veins: The IVC is dilated with normal respiratoryvariation. Estimated right atrial pressure is 8 mmHg.  Antibiotics: Anti-infectives (From admission, onward)   None        (indicate start date, and stop date if known)  HPI/Subjective:  Denies acute pains, no dyspnea. No nausea, vomiting. Awaiting LHC  Objective: Vitals:   02/05/18 0400 02/05/18 0754  BP: (!) 148/95 (!) 178/81  Pulse: 78 87  Resp:    Temp: 98.6 F (37 C) 98.1 F (36.7 C)  SpO2: 94% 97%    Intake/Output Summary (Last 24 hours) at 02/05/2018 1005 Last data filed at 02/05/2018 0805 Gross per 24 hour  Intake 1507.1 ml  Output 1700 ml  Net -192.9 ml   Filed Weights   02/04/18 2129 02/05/18 0500 02/05/18 0607  Weight: 58.2 kg 58.2 kg 58.2 kg    Exam:   General:  No distress   Cardiovascular: s1,s2 rrr  Respiratory: CTA BL  Abdomen: soft, tn   Musculoskeletal: no leg edema    Data Reviewed: Basic Metabolic Panel: Recent Labs  Lab 02/02/18 0721 02/02/18 1853 02/03/18 0351 02/04/18 0433 02/05/18 0214  NA 139  --  136 137 133*  K 4.8  --  4.2 4.6 4.5  CL 107  --  101 100 101  CO2 25  --  26 27 24   GLUCOSE 238*  --  209*  210* 223*  BUN 18  --  28* 29* 26*  CREATININE 1.20*  --  1.88* 1.43* 1.26*  CALCIUM 8.9  --  8.8* 9.2 8.7*  MG  --  1.6* 2.2  --   --   PHOS  --   --   --  3.6 3.5   Liver Function Tests: Recent Labs  Lab 02/04/18 0433  02/04/18 1059  ALBUMIN 3.4* 3.6   No results for input(s): LIPASE, AMYLASE in the last 168 hours. No results for input(s): AMMONIA in the last 168 hours. CBC: Recent Labs  Lab 02/02/18 0721 02/03/18 0351 02/04/18 0432 02/05/18 0214  WBC 11.9* 6.4 6.5 6.5  HGB 11.6* 10.0* 11.0* 9.7*  HCT 37.9 32.2* 35.2* 31.3*  MCV 94.5 90.4 90.5 89.7  PLT 232 176 191 186   Cardiac Enzymes: Recent Labs  Lab 02/02/18 0721 02/02/18 1306 02/02/18 1853 02/03/18 0045 02/04/18 0545  TROPONINI 0.05* 0.79* 1.13* 0.67* 0.20*   BNP (last 3 results) Recent Labs    02/02/18 0721  BNP 579.0*    ProBNP (last 3 results) No results for input(s): PROBNP in the last 8760 hours.  CBG: Recent Labs  Lab 02/04/18 0746 02/04/18 1118 02/04/18 1621 02/04/18 2118 02/05/18 0629  GLUCAP 210* 246* 181* 158* 191*    Recent Results (from the past 240 hour(s))  MRSA PCR Screening     Status: Abnormal   Collection Time: 02/02/18  8:58 PM  Result Value Ref Range Status   MRSA by PCR POSITIVE (A) NEGATIVE Final    Comment:        The GeneXpert MRSA Assay (FDA approved for NASAL specimens only), is one component of a comprehensive MRSA colonization surveillance program. It is not intended to diagnose MRSA infection nor to guide or monitor treatment for MRSA infections. RESULT CALLED TO, READ BACK BY AND VERIFIED WITH: HEARN,J. AT 0011 ON 02/03/2018 BY EVA Performed at [facility], [address], [place], [place] [address]      Studies: US Renal  Result Date: 02/03/2018 CLINICAL DATA:  Acute on chronic renal failure. History of diabetes, hypertension. EXAM: RENAL / URINARY TRACT ULTRASOUND COMPLETE COMPARISON:  None. FINDINGS: Right Kidney: Renal measurements: 10.3 x 4.9 x 5.8 cm = volume: 154 mL. The renal cortical echotexture is normal. There is no hydronephrosis Left Kidney: Renal measurements: 8.1 x 4.8 x 4.8 cm = volume: 112 mL. There is limited visualization of the kidney due to increased  by bowel gas. No definite parenchymal mass. No hydronephrosis. Bladder: Appears normal for degree of bladder distention. IMPRESSION: There is mild left renal atrophy. There is no hydronephrosis. No significant increase in the cortical echotexture of either kidney. Electronically Signed   By: David  Martinique M.D.   On: 02/03/2018 10:22   Dg Chest Port 1 View  Result Date: 02/03/2018 CLINICAL DATA:  Acute pulmonary edema EXAM: PORTABLE CHEST 1 VIEW COMPARISON:  02/02/2018 FINDINGS: Cardiac shadow is again enlarged. The lungs are well aerated bilaterally. Previously seen vascular congestion and increased interstitial markings have improved in the interval from the prior exam. No focal infiltrate is seen. No bony abnormality is noted. IMPRESSION: Resolution of previously seen interstitial changes and vascular congestion. Electronically Signed   By: Inez Catalina M.D.   On: 02/03/2018 11:28    Scheduled Meds: . aspirin EC  81 mg Oral Daily  . carvedilol  6.25 mg Oral BID WC  . Chlorhexidine Gluconate Cloth  6 each Topical Q0600  . ezetimibe  10 mg  Oral Daily  . fenofibrate  160 mg Oral Daily  . insulin aspart  0-5 Units Subcutaneous QHS  . insulin aspart  0-9 Units Subcutaneous TID WC  . insulin aspart  6 Units Subcutaneous TID WC  . insulin glargine  10 Units Subcutaneous q morning - 10a  . mouth rinse  15 mL Mouth Rinse BID  . mupirocin ointment  1 application Nasal BID  . oxyCODONE  5 mg Oral TID  . pantoprazole  40 mg Oral Daily  . PARoxetine  20 mg Oral BH-q7a  . rosuvastatin  10 mg Oral q1800  . sodium chloride flush  3 mL Intravenous Q12H  . sodium chloride flush  3 mL Intravenous Q12H   Continuous Infusions: . sodium chloride Stopped (02/05/18 0805)  . sodium chloride    . sodium chloride    . sodium chloride 75 mL/hr at 02/05/18 0604  . heparin 1,150 Units/hr (02/05/18 0758)  . nitroGLYCERIN 25 mcg/min (02/05/18 0801)    Principal Problem:   Flash pulmonary edema (HCC) Active  Problems:   T2DM (type 2 diabetes mellitus) ([place])   HLD (hyperlipidemia)   Essential hypertension   Asthma   GASTROESOPHAGEAL REFLUX DISEASE, CHRONIC   Depression with anxiety   Hypertensive emergency    Time spent: >35 minutes     Kinnie Feil  [facility] Pager [phone]. If 7PM-7AM, please contact night-coverage at [email or web address], password [facility] 02/05/2018, 10:05 AM  LOS: 3 days

## 2018-02-05 NOTE — Progress Notes (Signed)
Pt arrived back to 4e08 from cath lab. TR band present on right wrist. 13 cc air present on arrival. Wrist propped up on pillow. Pt sleepy, but responds appropriately to questions. Will continue to monitor.   Ara Kussmaul BSN, RN

## 2018-02-05 NOTE — Progress Notes (Signed)
   Patient underwent LHC today which showed mild to moderate non-obstructive CAD. Echo this admission with EF 45%. He labetalol was transitioned to carvedilol 6.25mg  BID. Patient was seen post-cath without complaints. R radial cath site C/D/I with hematoma or ecchymosis.   Left heart catheterization 02/05/18:  Prox RCA to Mid RCA lesion is 40% stenosed.  Mid RCA to Dist RCA lesion is 30% stenosed.  Ost Cx to Mid Cx lesion is 20% stenosed.  Ost Ramus lesion is 40% stenosed.  Ramus lesion is 20% stenosed.  Prox LAD to Mid LAD lesion is 20% stenosed.   1. Mild to moderate non-obstructive CAD 2. Mild elevation LVEDP  Recommendation: Medical management of CAD.    Plan: 1. Non-obstructive CAD: mild-moderate disease noted on LHC today - Continue aggressive risk factor modifications  - Continue aspirin, statin, zetia, and fenofibrate - Continue carvedilol 12.5mg  BID  2. Acute systolic CHF: EF 00% this admission. ARB not imitated inpatient due to AKI - Continue carvedilol 12.5mg  BID - Can resume home lasix at discharge - Consider adding ARB/Entresto outpatient if Cr stable at follow-up visit  3. HTN: BP elevated.  - Will increase carvedilol to 12.5mg  BID  4. HLD/Hypertriglyceridemia: LDL 40 this admission; triglycerides 151. Started on fenofibrate, in addition to home crestor and zetia.  - Continue crestor and zetia - Continue fenofibrate  5. DM type 2: HgbA1C 7.6 this admission; goal <7 - Continue close outpatient follow-up for optimization of glycemic control. Patient can resume metformin 02/08/18 following catheterization.   [facility] will sign off.   Medication Recommendations:  Continue current medications - can restart home lasix at discharge. Consideration for adding ARB/Entresto can be made outpatient if Cr stable Other recommendations (labs, testing, etc):  None Follow up as an outpatient:  Scheduled to see Jory Sims, NP 02/16/18 for close outpatient  follow-up, then can follow at the [facility] office for ongoing care.

## 2018-02-05 NOTE — Plan of Care (Signed)
  Problem: Education: Goal: Knowledge of General Education information will improve Description Including pain rating scale, medication(s)/side effects and non-pharmacologic comfort measures Outcome: Progressing   Problem: Health Behavior/Discharge Planning: Goal: Ability to manage health-related needs will improve Outcome: Progressing   

## 2018-02-05 NOTE — Progress Notes (Signed)
Right radial TR band removed. Site clean and dry. Gauze and tegaderm dressing applied.   Ara Kussmaul BSN, RN

## 2018-02-05 NOTE — Progress Notes (Signed)
[place] for heparin dosing Indication: chest pain/ACS  Allergies  Allergen Reactions  . Fluoxetine Hcl     REACTION: UNKNOWN REACTION  . Methocarbamol Nausea And Vomiting  . Metoclopramide Hcl     REACTION: UNKNOWN REACTION  . Motrin [Ibuprofen] Other (See Comments)    REACTION UNKNOWN  . Rabeprazole Sodium     REACTION: UNKNOWN REACTION    Patient Measurements: Height: 4\' 9"  (144.8 cm) Weight: 128 lb 6.4 oz (58.2 kg) IBW/kg (Calculated) : 38.6 Heparin Dosing Weight:  HEPARIN DW (KG): 51.2    Vital Signs: Temp: 98.3 F (36.8 C) (11/13 2129) Temp Source: Oral (11/13 2129) BP: 155/89 (11/13 2151) Pulse Rate: 77 (11/13 2151)  Labs: Recent Labs    02/02/18 0721  02/02/18 1853 02/02/18 2041 02/03/18 0045 02/03/18 0351  02/04/18 0432 02/04/18 0433 02/04/18 0545 02/04/18 1536 02/05/18 0214  HGB 11.6*  --   --   --   --  10.0*  --  11.0*  --   --   --  9.7*  HCT 37.9  --   --   --   --  32.2*  --  35.2*  --   --   --  31.3*  PLT 232  --   --   --   --  176  --  191  --   --   --  186  LABPROT  --   --   --  13.2  --   --   --   --   --   --   --   --   INR  --   --   --  1.01  --   --   --   --   --   --   --   --   HEPARINUNFRC  --   --   --  0.36  --   --    < >  --  0.10*  --  0.24* 0.22*  CREATININE 1.20*  --   --   --   --  1.88*  --   --  1.43*  --   --   --   TROPONINI 0.05*   < > 1.13*  --  0.67*  --   --   --   --  0.20*  --   --    < > = values in this interval not displayed.    Estimated Creatinine Clearance: 28.7 mL/min (A) (by C-G formula based on SCr of 1.43 mg/dL (H)).   Medical History: Past Medical History:  Diagnosis Date  . Arthritis   . COPD (chronic obstructive pulmonary disease) ([place])   . Depression with anxiety 02/02/2018  . Diabetes mellitus   . High cholesterol   . Hypertension   . Mental disorder    Assessment:   Pharmacy consulted to dose heparin for this 32 yof with chest pain/ACS.    Heparin level subtherapeutic 0.22 units/ml   Goal of Therapy:  Heparin level 0.3-0.7 units/ml Monitor platelets by anticoagulation protocol: Yes   Plan:  Increase heparin infusion to 1150 units/hr Check anti-Xa level in 6-8 hours and daily while on heparin. Continue to monitor H&H and platelets.   Excell Seltzer, PharmD Clinical Pharmacist 02/05/2018 3:43 AM

## 2018-02-05 NOTE — Plan of Care (Signed)
Plan of care initiated and progressing 

## 2018-02-06 DIAGNOSIS — R7989 Other specified abnormal findings of blood chemistry: Secondary | ICD-10-CM

## 2018-02-06 LAB — GLUCOSE, CAPILLARY
Glucose-Capillary: 143 mg/dL — ABNORMAL HIGH (ref 70–99)
Glucose-Capillary: 164 mg/dL — ABNORMAL HIGH (ref 70–99)
Glucose-Capillary: 289 mg/dL — ABNORMAL HIGH (ref 70–99)
Glucose-Capillary: 140 mg/dL — ABNORMAL HIGH (ref 70–99)

## 2018-02-06 LAB — PTH, INTACT AND CALCIUM
CALCIUM TOTAL (PTH): 8.7 mg/dL (ref 8.7–10.3)
PTH: 26 pg/mL (ref 15–65)

## 2018-02-06 MED ORDER — AMLODIPINE BESYLATE 10 MG PO TABS
10.0000 mg | ORAL_TABLET | Freq: Every day | ORAL | Status: DC
  Administered 2018-02-06: 10 mg via ORAL
  Filled 2018-02-06: qty 1

## 2018-02-06 MED ORDER — FUROSEMIDE 20 MG PO TABS
20.0000 mg | ORAL_TABLET | Freq: Every day | ORAL | Status: DC
  Administered 2018-02-06: 20 mg via ORAL
  Filled 2018-02-06: qty 1

## 2018-02-06 MED ORDER — AMLODIPINE BESYLATE 10 MG PO TABS: 10.0000 mg | ORAL_TABLET | Freq: Every day | ORAL | 0 refills | Status: DC

## 2018-02-06 MED ORDER — FENOFIBRATE 160 MG PO TABS: 160.0000 mg | ORAL_TABLET | Freq: Every day | ORAL | 0 refills | Status: AC

## 2018-02-06 MED ORDER — CARVEDILOL 12.5 MG PO TABS: 12.5000 mg | ORAL_TABLET | Freq: Two times a day (BID) | ORAL | 0 refills | Status: DC

## 2018-02-06 MED ORDER — VITAMIN D (ERGOCALCIFEROL) 1.25 MG (50000 UNIT) PO CAPS
50000.0000 [IU] | ORAL_CAPSULE | ORAL | Status: DC
  Administered 2018-02-06: 50000 [IU] via ORAL
  Filled 2018-02-06: qty 1

## 2018-02-06 MED ORDER — ROSUVASTATIN CALCIUM 10 MG PO TABS: 10.0000 mg | ORAL_TABLET | Freq: Every day | ORAL | 0 refills | Status: DC

## 2018-02-06 MED ORDER — ACETAMINOPHEN 325 MG PO TABS: 650.0000 mg | ORAL_TABLET | ORAL | Status: DC | PRN

## 2018-02-06 MED ORDER — VITAMIN D (ERGOCALCIFEROL) 1.25 MG (50000 UNIT) PO CAPS: 50000.0000 [IU] | ORAL_CAPSULE | ORAL | 10 refills | Status: AC

## 2018-02-06 MED ORDER — ASPIRIN 81 MG PO TBEC: 81.0000 mg | DELAYED_RELEASE_TABLET | Freq: Every day | ORAL | 0 refills | Status: DC

## 2018-02-06 NOTE — Care Management Note (Signed)
Case Management Note [name] RN, BSN Transitions of Care Unit 4E- RN Case Manager [phone]  Patient Details  Name: Kristen Jones MRN: [id number] Date of Birth: 10-06-1950  Subjective/Objective:     Pt admitted with flash pulm. edema               Action/Plan: PTA pt lived at home, plan to return home, needs assistance with transportation- CM spoke with pt at bedside to confirm pt had no other means for transportation no family or friends that can come to transport- pt states she does not- per Thedore Mins AD - pt has been approved for taxi voucher- CSW to assist with transportation home. No further CM needs noted for transition home.   Expected Discharge Date:  02/06/18               Expected Discharge Plan:  Home/Self Care  In-House Referral:  NA  Discharge planning Services  CM Consult  Post Acute Care Choice:  NA Choice offered to:  NA  DME Arranged:    DME Agency:     HH Arranged:    HH Agency:     Status of Service:  Completed, signed off  If discussed at [place] of Stay Meetings, dates discussed:    Discharge Disposition: home/self care  Additional Comments:  Dawayne Patricia, RN 02/06/2018, 2:47 PM

## 2018-02-06 NOTE — Discharge Summary (Signed)
Physician Discharge Summary  Kristen Jones BSJ:[id number] DOB: 1950-06-15 DOA: 02/02/2018  PCP: Lucia Gaskins, MD  Admit date: 02/02/2018 Discharge date: 02/06/2018  Time spent: >35 minutes  Recommendations for Outpatient Follow-up:  PCP In 3-7 days. Recheck labs Cardiology in 1-2 weeks   Discharge Diagnoses:  Principal Problem:   Flash pulmonary edema (HCC) Active Problems:   T2DM (type 2 diabetes mellitus) ([place])   HLD (hyperlipidemia)   Essential hypertension   Asthma   GASTROESOPHAGEAL REFLUX DISEASE, CHRONIC   Depression with anxiety   Hypertensive emergency   Elevated troponin   Discharge Condition: stable   Diet recommendation: carb modified, low sodium   Filed Weights   02/05/18 0500 02/05/18 0607 02/06/18 0152  Weight: 58.2 kg 58.2 kg 58.2 kg    History of present illness:    67 y.o.femalewith medical history significant of67 year old with past medical history relevant for depression/anxiety, hypertension, type 2 diabetes, asthma/COPD, hyperlipidemia who presents with acute hypoxic respiratory failure secondary to hypertensive emergency.  Hospital Course:   NSTEMI. Elevated troponin on admission. Cardiology team has been consulted.  -underwent LHC (11/14): Mild to moderate non-obstructive CAD. Cardiology recommended aspirin bb, statin. Cont outpatient follow up   Hypertensive emergency. Initially, on IV nitroglycerin and blood pressures are coming down. She has been restarted on her home BP meds for longstanding chronic hypertension. Cont BB. Cont outpatient follow up with titration of her meds   Acute CHF. Echo. LVEF 76%, diastolic dysfunction II. Flash pulmonary edema on admission. Pt has diuresedwell since admission and feeling a lot better.  -cont BB, resumed her home lasix, lisinopril. Recheck renal labs in 3-7 days to contrast exposure with LHC  Type 2 DM -A1c is 7.6%. Cont diet control, metformin. Recheck renal labs in 3-7 days    Hyperlipidemia - resumed home medications.   Depression - resumed home paroxetine.   Procedures:  LHC: Mild to moderate non-obstructive CAD   (i.e. Studies not automatically included, echos, thoracentesis, etc; not x-rays)  Consultations:  Cardiology   Discharge Exam: Vitals:   02/06/18 0755 02/06/18 0800  BP: (!) 176/76 (!) 180/78  Pulse:  79  Resp: 18   Temp: 99 F (37.2 C)   SpO2: 98%     General: no acute distress  Cardiovascular: s1,s2 rrr Respiratory: CTA BL  Discharge Instructions  Discharge Instructions    Diet - low sodium heart healthy   Complete by:  As directed    Increase activity slowly   Complete by:  As directed      Allergies as of 02/06/2018      Reactions   Fluoxetine Hcl    REACTION: UNKNOWN REACTION   Methocarbamol Nausea And Vomiting   Metoclopramide Hcl    REACTION: UNKNOWN REACTION   Motrin [ibuprofen] Other (See Comments)   REACTION UNKNOWN   Rabeprazole Sodium    REACTION: UNKNOWN REACTION      Medication List    STOP taking these medications   cephALEXin 500 MG capsule Commonly known as:  KEFLEX   FARXIGA 10 MG Tabs tablet Generic drug:  dapagliflozin propanediol   labetalol 200 MG tablet Commonly known as:  NORMODYNE   naproxen 500 MG tablet Commonly known as:  NAPROSYN     TAKE these medications   acetaminophen 325 MG tablet Commonly known as:  TYLENOL Take 2 tablets (650 mg total) by mouth every 4 (four) hours as needed for headache or mild pain.   albuterol 108 (90 Base) MCG/ACT inhaler Commonly known as:  PROVENTIL  HFA;VENTOLIN HFA Inhale 1 puff into the lungs every 6 (six) hours as needed for wheezing or shortness of breath.   albuterol-ipratropium 18-103 MCG/ACT inhaler Commonly known as:  COMBIVENT Inhale 2 puffs into the lungs every 6 (six) hours as needed for wheezing.   amLODipine 10 MG tablet Commonly known as:  NORVASC Take 1 tablet (10 mg total) by mouth daily. Start taking on:   02/07/2018   aspirin 81 MG EC tablet Take 1 tablet (81 mg total) by mouth daily.   carvedilol 12.5 MG tablet Commonly known as:  COREG Take 1 tablet (12.5 mg total) by mouth 2 (two) times daily with a meal.   ezetimibe 10 MG tablet Commonly known as:  ZETIA Take 10 mg by mouth daily.   fenofibrate 160 MG tablet Take 1 tablet (160 mg total) by mouth daily.   furosemide 20 MG tablet Commonly known as:  LASIX Take 20 mg by mouth daily.   lisinopril 20 MG tablet Commonly known as:  PRINIVIL,ZESTRIL Take 20 mg by mouth daily.   metFORMIN 500 MG tablet Commonly known as:  GLUCOPHAGE Take by mouth 2 (two) times daily with a meal.   oxyCODONE 5 MG immediate release tablet Commonly known as:  Oxy IR/ROXICODONE Take 5 mg by mouth 3 (three) times daily.   pantoprazole 40 MG tablet Commonly known as:  PROTONIX Take 40 mg by mouth daily.   PARoxetine 20 MG tablet Commonly known as:  PAXIL Take 20 mg by mouth every morning.   rosuvastatin 10 MG tablet Commonly known as:  CRESTOR Take 1 tablet (10 mg total) by mouth daily at 6 PM.   Vitamin D (Ergocalciferol) 1.25 MG (50000 UT) Caps capsule Commonly known as:  DRISDOL Take 1 capsule (50,000 Units total) by mouth every 7 (seven) days.      Allergies  Allergen Reactions  . Fluoxetine Hcl     REACTION: UNKNOWN REACTION  . Methocarbamol Nausea And Vomiting  . Metoclopramide Hcl     REACTION: UNKNOWN REACTION  . Motrin [Ibuprofen] Other (See Comments)    REACTION UNKNOWN  . Rabeprazole Sodium     REACTION: UNKNOWN REACTION   Follow-up Information    Lendon Colonel, NP Follow up on 02/16/2018.   Specialties:  Nurse Practitioner, Radiology, Cardiology Why:  Please arrive 15 minutes early for your 10:00am post-hospital cardiology follow-up appointment.  Contact information: [address] [place] [address] [phone]        Erma Heritage, PA-C Follow up on 03/26/2018.   Specialties:   Physician Assistant, Cardiology Why:  Please arrive 15 minutes early for you 2:30pm cardiology follow-up appointment Contact information: [address] [place] [address] [phone]            The results of significant diagnostics from this hospitalization (including imaging, microbiology, ancillary and laboratory) are listed below for reference.    Significant Diagnostic Studies: US Renal  Result Date: 02/03/2018 CLINICAL DATA:  Acute on chronic renal failure. History of diabetes, hypertension. EXAM: RENAL / URINARY TRACT ULTRASOUND COMPLETE COMPARISON:  None. FINDINGS: Right Kidney: Renal measurements: 10.3 x 4.9 x 5.8 cm = volume: 154 mL. The renal cortical echotexture is normal. There is no hydronephrosis Left Kidney: Renal measurements: 8.1 x 4.8 x 4.8 cm = volume: 112 mL. There is limited visualization of the kidney due to increased by bowel gas. No definite parenchymal mass. No hydronephrosis. Bladder: Appears normal for degree of bladder distention. IMPRESSION: There is mild left renal  atrophy. There is no hydronephrosis. No significant increase in the cortical echotexture of either kidney. Electronically Signed   By: David  Martinique M.D.   On: 02/03/2018 10:22   Dg Chest Port 1 View  Result Date: 02/03/2018 CLINICAL DATA:  Acute pulmonary edema EXAM: PORTABLE CHEST 1 VIEW COMPARISON:  02/02/2018 FINDINGS: Cardiac shadow is again enlarged. The lungs are well aerated bilaterally. Previously seen vascular congestion and increased interstitial markings have improved in the interval from the prior exam. No focal infiltrate is seen. No bony abnormality is noted. IMPRESSION: Resolution of previously seen interstitial changes and vascular congestion. Electronically Signed   By: Inez Catalina M.D.   On: 02/03/2018 11:28   Dg Chest Portable 1 View  Result Date: 02/02/2018 CLINICAL DATA:  Productive cough, wheezing, shortness of breath. History of asthma-COPD, former smoker. EXAM:  PORTABLE CHEST 1 VIEW COMPARISON:  PA and lateral chest x-ray of December 03, 2012 FINDINGS: The right lung is well-expanded. The interstitial markings are minimally prominent. On the left the interstitial markings are increased diffusely. The lung is adequately inflated. No alveolar infiltrate is observed. The cardiac silhouette is enlarged and the pulmonary vascularity is mildly engorged. There is calcification in the wall of the aortic arch. The bony thorax exhibits no acute abnormality. IMPRESSION: The findings are worrisome for asymmetric pulmonary edema or interstitial pneumonia greatest on the left. Correlation with clinical and laboratory values is needed. If pneumonia is felt most likely on clinical grounds, followup PA and lateral chest X-ray is recommended in 3-4 weeks following trial of antibiotic therapy to ensure resolution and exclude underlying malignancy. Electronically Signed   By: David  Martinique M.D.   On: 02/02/2018 07:30    Microbiology: Recent Results (from the past 240 hour(s))  MRSA PCR Screening     Status: Abnormal   Collection Time: 02/02/18  8:58 PM  Result Value Ref Range Status   MRSA by PCR POSITIVE (A) NEGATIVE Final    Comment:        The GeneXpert MRSA Assay (FDA approved for NASAL specimens only), is one component of a comprehensive MRSA colonization surveillance program. It is not intended to diagnose MRSA infection nor to guide or monitor treatment for MRSA infections. RESULT CALLED TO, READ BACK BY AND VERIFIED WITH: HEARN,J. AT 0011 ON 02/03/2018 BY EVA Performed at [facility] [place], [address], [place], [place] [address]      Labs: Basic Metabolic Panel: Recent Labs  Lab 02/02/18 0721 02/02/18 1853 02/03/18 0351 02/04/18 0433 02/05/18 0214  NA 139  --  136 137 133*  K 4.8  --  4.2 4.6 4.5  CL 107  --  101 100 101  CO2 25  --  26 27 24   GLUCOSE 238*  --  209* 210* 223*  BUN 18  --  28* 29* 26*  CREATININE 1.20*  --  1.88* 1.43* 1.26*   CALCIUM 8.9  --  8.8* 9.2 8.7*  8.7  MG  --  1.6* 2.2  --   --   PHOS  --   --   --  3.6 3.5   Liver Function Tests: Recent Labs  Lab 02/04/18 0433 02/04/18 1059  ALBUMIN 3.4* 3.6   No results for input(s): LIPASE, AMYLASE in the last 168 hours. No results for input(s): AMMONIA in the last 168 hours. CBC: Recent Labs  Lab 02/02/18 0721 02/03/18 0351 02/04/18 0432 02/05/18 0214  WBC 11.9* 6.4 6.5 6.5  HGB 11.6* 10.0* 11.0* 9.7*  HCT 37.9  32.2* 35.2* 31.3*  MCV 94.5 90.4 90.5 89.7  PLT 232 176 191 186   Cardiac Enzymes: Recent Labs  Lab 02/02/18 0721 02/02/18 1306 02/02/18 1853 02/03/18 0045 02/04/18 0545  TROPONINI 0.05* 0.79* 1.13* 0.67* 0.20*   BNP: BNP (last 3 results) Recent Labs    02/02/18 0721  BNP 579.0*    ProBNP (last 3 results) No results for input(s): PROBNP in the last 8760 hours.  CBG: Recent Labs  Lab 02/05/18 1216 02/05/18 1614 02/05/18 2120 02/06/18 0611 02/06/18 0800  GLUCAP 136* 149* 170* 143* 164*       Signed:  Ameri Cahoon N  [facility] 02/06/2018, 9:49 AM

## 2018-02-06 NOTE — Plan of Care (Signed)
  Problem: Education: Goal: Knowledge of General Education information will improve Description Including pain rating scale, medication(s)/side effects and non-pharmacologic comfort measures Outcome: Progressing   Problem: Health Behavior/Discharge Planning: Goal: Ability to manage health-related needs will improve Outcome: Progressing   Problem: Clinical Measurements: Goal: Ability to maintain clinical measurements within normal limits will improve Outcome: Progressing Goal: Will remain free from infection Outcome: Progressing Goal: Diagnostic test results will improve Outcome: Progressing Goal: Respiratory complications will improve Outcome: Progressing Goal: Cardiovascular complication will be avoided Outcome: Progressing   Problem: Activity: Goal: Risk for activity intolerance will decrease Outcome: Progressing   Problem: Coping: Goal: Level of anxiety will decrease Outcome: Progressing   Problem: Elimination: Goal: Will not experience complications related to bowel motility Outcome: Progressing Goal: Will not experience complications related to urinary retention Outcome: Progressing   Problem: Pain Managment: Goal: General experience of comfort will improve Outcome: Progressing   Problem: Safety: Goal: Ability to remain free from injury will improve Outcome: Progressing   Problem: Education: Goal: Ability to demonstrate management of disease process will improve Outcome: Progressing Goal: Ability to verbalize understanding of medication therapies will improve Outcome: Progressing   Problem: Activity: Goal: Capacity to carry out activities will improve Outcome: Progressing   Problem: Cardiac: Goal: Ability to achieve and maintain adequate cardiopulmonary perfusion will improve Outcome: Progressing   Problem: Education: Goal: Knowledge of disease or condition will improve Outcome: Progressing   Problem: Activity: Goal: Ability to tolerate increased  activity will improve Outcome: Progressing   Problem: Respiratory: Goal: Ability to maintain a clear airway will improve Outcome: Progressing Goal: Levels of oxygenation will improve Outcome: Progressing Goal: Ability to maintain adequate ventilation will improve Outcome: Progressing   Problem: Education: Goal: Understanding of CV disease, CV risk reduction, and recovery process will improve Outcome: Progressing   Problem: Activity: Goal: Ability to return to baseline activity level will improve Outcome: Progressing   Problem: Cardiovascular: Goal: Ability to achieve and maintain adequate cardiovascular perfusion will improve Outcome: Progressing Goal: Vascular access site(s) Level 0-1 will be maintained Outcome: Progressing

## 2018-02-06 NOTE — Progress Notes (Signed)
Patient discharged per MD orders. Discharge instructions and education provided to the patient, patient verbalized understanding all discharge follow up care. Telemetry box and peripheral IV removed. Patient transported home via taxi, with voucher provided by Education officer, museum. RN accompanied patient to wait for transportation. The patient is returning home with all personal belongings including clothing, cell phone, and wallet.

## 2018-02-06 NOTE — Evaluation (Signed)
Physical Therapy Evaluation Patient Details Name: Kristen Jones MRN: [id number] DOB: [birth year] Today's Date: 02/06/2018   History of Present Illness   Kristen Jones is a 67 y.o. female with medical history significant for depression/anxiety, hypertension, type 2 diabetes, asthma/COPD, hyperlipidemia who presents with acute hypoxic respiratory failure secondary to hypertensive emergency. Echo this admission which showed EF of 45%. Underwent LHC which showed mild to moderate non obstructive CAD. Recommendation for medical management.   Clinical Impression  Pt admitted with above diagnosis. Patient seems to be close to functional baseline. Ambulating 200 feet with no assistive device and mild unsteadiness noted. Presents with decreased endurance. No PT follow up or equipment recommended at discharge. Will follow acutely to progress mobility.   Prior to mobility: 87 bpm, 180/78 BP Post mobility: 104 bpm, 97% SpO2, 160/134 BP (potentially inaccurate; MD aware), BP reassessed to be 178/82      Follow Up Recommendations Supervision - Intermittent;No PT follow up    Equipment Recommendations  None recommended by PT    Recommendations for Other Services       Precautions / Restrictions Precautions Precautions: Fall Precaution Comments: watch BP Restrictions Weight Bearing Restrictions: No      Mobility  Bed Mobility Overal bed mobility: Modified Independent                Transfers Overall transfer level: Modified independent                  Ambulation/Gait Ambulation/Gait assistance: Supervision Gait Distance (Feet): 200 Feet Assistive device: None Gait Pattern/deviations: Decreased step length - right;Decreased step length - left;Decreased stride length;Decreased stance time - left Gait velocity: decreased Gait velocity interpretation: 1.31 - 2.62 ft/sec, indicative of limited community ambulator General Gait Details: Mild unsteadiness with limping on LLE  which is baseline from childhood due to surgeries. No overt LOB. DOE 2/4  Financial trader Rankin (Stroke Patients Only)       Balance Overall balance assessment: Mild deficits observed, not formally tested                                           Pertinent Vitals/Pain Pain Assessment: No/denies pain    Home Living Family/patient expects to be discharged to:: Private residence Living Arrangements: Alone Available Help at Discharge: Friend(s) Type of Home: House Home Access: Stairs to enter Entrance Stairs-Rails: Right;Left;Can reach both Entrance Stairs-Number of Steps: 4-5 Home Layout: Two level Home Equipment: Environmental consultant - 2 wheels;Cane - single point;Bedside commode      Prior Function Level of Independence: Independent with assistive device(s)         Comments: household ambulator with RW PRN     Hand Dominance        Extremity/Trunk Assessment   Upper Extremity Assessment Upper Extremity Assessment: Overall WFL for tasks assessed    Lower Extremity Assessment Lower Extremity Assessment: Overall WFL for tasks assessed    Cervical / Trunk Assessment Cervical / Trunk Assessment: Normal  Communication   Communication: No difficulties  Cognition Arousal/Alertness: Awake/alert Behavior During Therapy: WFL for tasks assessed/performed Overall Cognitive Status: Within Functional Limits for tasks assessed  General Comments      Exercises     Assessment/Plan    PT Assessment Patient needs continued PT services  PT Problem List Decreased activity tolerance;Decreased balance;Decreased mobility       PT Treatment Interventions DME instruction;Gait training;Stair training;Functional mobility training;Therapeutic activities;Therapeutic exercise;Balance training;Patient/family education    PT Goals (Current goals can be found in the Care Plan  section)  Acute Rehab PT Goals Patient Stated Goal: none stated; agreeable to participate in evaluation PT Goal Formulation: With patient Time For Goal Achievement: 02/20/18 Potential to Achieve Goals: Good    Frequency Min 3X/week   Barriers to discharge        Co-evaluation               AM-PAC PT "6 Clicks" Daily Activity  Outcome Measure Difficulty turning over in bed (including adjusting bedclothes, sheets and blankets)?: None Difficulty moving from lying on back to sitting on the side of the bed? : None Difficulty sitting down on and standing up from a chair with arms (e.g., wheelchair, bedside commode, etc,.)?: None Help needed moving to and from a bed to chair (including a wheelchair)?: None Help needed walking in hospital room?: A Little Help needed climbing 3-5 steps with a railing? : A Little 6 Click Score: 22    End of Session   Activity Tolerance: Patient tolerated treatment well;Patient limited by fatigue Patient left: in chair;with call bell/phone within reach Nurse Communication: Mobility status PT Visit Diagnosis: Unsteadiness on feet (R26.81);Other abnormalities of gait and mobility (R26.89);Muscle weakness (generalized) (M62.81)    Time: 1791-5056 PT Time Calculation (min) (ACUTE ONLY): 13 min   Charges:   PT Evaluation $PT Eval Moderate Complexity: 1 Mod         Ellamae Sia, [place], DPT Acute Rehabilitation Services Pager [phone] Office [phone]   Willy Eddy 02/06/2018, 8:31 AM

## 2018-02-11 ENCOUNTER — Emergency Department (HOSPITAL_COMMUNITY)
Admission: EM | Admit: 2018-02-11 | Discharge: 2018-02-11 | Disposition: A | Discharge status: Home / Self Care | Payer: Medicare Other | Attending: Emergency Medicine | Admitting: Emergency Medicine

## 2018-02-11 ENCOUNTER — Emergency Department (HOSPITAL_COMMUNITY)
Admission: EM | Admit: 2018-02-11 | Discharge: 2018-02-11 | Disposition: A | Discharge status: Home / Self Care | Payer: Medicare Other | Source: Home / Self Care | Attending: Emergency Medicine | Admitting: Emergency Medicine

## 2018-02-11 ENCOUNTER — Encounter (HOSPITAL_COMMUNITY): Payer: Self-pay | Admitting: Emergency Medicine

## 2018-02-11 ENCOUNTER — Encounter (HOSPITAL_COMMUNITY): Payer: Self-pay

## 2018-02-11 ENCOUNTER — Other Ambulatory Visit: Payer: Self-pay

## 2018-02-11 ENCOUNTER — Emergency Department (HOSPITAL_COMMUNITY): Payer: Medicare Other

## 2018-02-11 DIAGNOSIS — R0602 Shortness of breath: Secondary | ICD-10-CM

## 2018-02-11 DIAGNOSIS — E7800 Pure hypercholesterolemia, unspecified: Secondary | ICD-10-CM

## 2018-02-11 DIAGNOSIS — I10 Essential (primary) hypertension: Secondary | ICD-10-CM | POA: Insufficient documentation

## 2018-02-11 DIAGNOSIS — L02413 Cutaneous abscess of right upper limb: Secondary | ICD-10-CM | POA: Diagnosis not present

## 2018-02-11 DIAGNOSIS — Z79899 Other long term (current) drug therapy: Secondary | ICD-10-CM | POA: Diagnosis not present

## 2018-02-11 DIAGNOSIS — Z7984 Long term (current) use of oral hypoglycemic drugs: Secondary | ICD-10-CM | POA: Diagnosis not present

## 2018-02-11 DIAGNOSIS — Z7982 Long term (current) use of aspirin: Secondary | ICD-10-CM | POA: Insufficient documentation

## 2018-02-11 DIAGNOSIS — Z87891 Personal history of nicotine dependence: Secondary | ICD-10-CM | POA: Diagnosis not present

## 2018-02-11 DIAGNOSIS — E785 Hyperlipidemia, unspecified: Secondary | ICD-10-CM | POA: Insufficient documentation

## 2018-02-11 DIAGNOSIS — R2231 Localized swelling, mass and lump, right upper limb: Secondary | ICD-10-CM | POA: Diagnosis present

## 2018-02-11 DIAGNOSIS — J449 Chronic obstructive pulmonary disease, unspecified: Secondary | ICD-10-CM

## 2018-02-11 DIAGNOSIS — E119 Type 2 diabetes mellitus without complications: Secondary | ICD-10-CM

## 2018-02-11 DIAGNOSIS — L0291 Cutaneous abscess, unspecified: Secondary | ICD-10-CM

## 2018-02-11 LAB — CBC WITH DIFFERENTIAL/PLATELET
Abs Immature Granulocytes: 0.03 10*3/uL (ref 0.00–0.07)
Basophils Absolute: 0.1 10*3/uL (ref 0.0–0.1)
Basophils Relative: 1 %
Eosinophils Absolute: 0.1 10*3/uL (ref 0.0–0.5)
Eosinophils Relative: 1 %
HCT: 31 % — ABNORMAL LOW (ref 36.0–46.0)
Hemoglobin: 9.6 g/dL — ABNORMAL LOW (ref 12.0–15.0)
Immature Granulocytes: 0 %
Lymphocytes Relative: 14 %
Lymphs Abs: 1 10*3/uL (ref 0.7–4.0)
MCH: 28.3 pg (ref 26.0–34.0)
MCHC: 31 g/dL (ref 30.0–36.0)
MCV: 91.4 fL (ref 80.0–100.0)
Monocytes Absolute: 0.4 10*3/uL (ref 0.1–1.0)
Monocytes Relative: 6 %
Neutro Abs: 5.2 10*3/uL (ref 1.7–7.7)
Neutrophils Relative %: 78 %
Platelets: 233 10*3/uL (ref 150–400)
RBC: 3.39 MIL/uL — ABNORMAL LOW (ref 3.87–5.11)
RDW: 15 % (ref 11.5–15.5)
WBC: 6.7 10*3/uL (ref 4.0–10.5)
nRBC: 0 % (ref 0.0–0.2)

## 2018-02-11 LAB — BASIC METABOLIC PANEL
Anion gap: 11 (ref 5–15)
BUN: 24 mg/dL — ABNORMAL HIGH (ref 8–23)
CO2: 21 mmol/L — ABNORMAL LOW (ref 22–32)
Calcium: 9 mg/dL (ref 8.9–10.3)
Chloride: 105 mmol/L (ref 98–111)
Creatinine, Ser: 1.48 mg/dL — ABNORMAL HIGH (ref 0.44–1.00)
GFR calc Af Amer: 42 mL/min — ABNORMAL LOW (ref >60)
GFR calc non Af Amer: 36 mL/min — ABNORMAL LOW (ref >60)
Glucose, Bld: 179 mg/dL — ABNORMAL HIGH (ref 70–99)
Potassium: 4.5 mmol/L (ref 3.5–5.1)
Sodium: 137 mmol/L (ref 135–145)

## 2018-02-11 LAB — BRAIN NATRIURETIC PEPTIDE: B Natriuretic Peptide: 1028 pg/mL — ABNORMAL HIGH (ref 0.0–100.0)

## 2018-02-11 LAB — I-STAT TROPONIN, ED: Troponin i, poc: 0.01 ng/mL (ref 0.00–0.08)

## 2018-02-11 LAB — CBG MONITORING, ED: GLUCOSE-CAPILLARY: 166 mg/dL — AB (ref 70–99)

## 2018-02-11 MED ORDER — ALBUTEROL SULFATE HFA 108 (90 BASE) MCG/ACT IN AERS
2.0000 | INHALATION_SPRAY | Freq: Four times a day (QID) | RESPIRATORY_TRACT | Status: DC
  Administered 2018-02-11: 2 via RESPIRATORY_TRACT
  Filled 2018-02-11 (×2): qty 6.7

## 2018-02-11 MED ORDER — POVIDONE-IODINE 10 % EX SOLN
CUTANEOUS | Status: AC
  Filled 2018-02-11: qty 15

## 2018-02-11 MED ORDER — IPRATROPIUM-ALBUTEROL 0.5-2.5 (3) MG/3ML IN SOLN
3.0000 mL | Freq: Once | RESPIRATORY_TRACT | Status: AC
  Administered 2018-02-11: 3 mL via RESPIRATORY_TRACT
  Filled 2018-02-11: qty 3

## 2018-02-11 MED ORDER — FUROSEMIDE 10 MG/ML IJ SOLN
40.0000 mg | Freq: Once | INTRAMUSCULAR | Status: AC
  Administered 2018-02-11: 40 mg via INTRAVENOUS
  Filled 2018-02-11: qty 4

## 2018-02-11 MED ORDER — DOXYCYCLINE HYCLATE 100 MG PO CAPS: 100.0000 mg | ORAL_CAPSULE | Freq: Two times a day (BID) | ORAL | 0 refills | Status: DC

## 2018-02-11 MED ORDER — ALBUTEROL SULFATE (2.5 MG/3ML) 0.083% IN NEBU: 2.5000 mg | INHALATION_SOLUTION | Freq: Four times a day (QID) | RESPIRATORY_TRACT | 12 refills | Status: AC | PRN

## 2018-02-11 NOTE — ED Notes (Signed)
Area cleaned and wrapped

## 2018-02-11 NOTE — Discharge Instructions (Signed)
Take the antibiotic as directed.  Follow-up either with Dr. Cindie Laroche or here in the fast track for wound check on Friday.  Return for any new or worse symptoms.  Keep the dressing on until you are seen again on Friday.

## 2018-02-11 NOTE — ED Provider Notes (Signed)
[facility] EMERGENCY DEPARTMENT Provider Note   CSN: [id number] Arrival date & time: 02/11/18  1744     History   Chief Complaint Chief Complaint  Patient presents with  . Shortness of Breath    HPI Kristen Jones is a 67 y.o. female.  Patient seen by me earlier today for drainage of an abscess on the right forearm.  At that time her oxygen saturations were 97% on room air patient did not have any wheezing lungs were clear did not seem to have any short of breath issues.  The abscess was I indeed.  Patient had follow-up arranged for 2 days either with her primary care provider to come back here.  Patient started on doxycycline.  Patient was dropped off by a family friend neighbor nobody was present when we saw her.  Patient was discharged apparently became very short of breath getting into the vehicle.  The family friend neighbor stated that patient has actually been short of breath all day and almost passed out earlier today.  Patient has a history of COPD and with most recent admission appears to have a history of CHF.  Patient not cognizant of referral to cardiology.  She is on Lasix 20 mg a day.  The family friend neighbor stated that patient used to be on oxygen no longer on oxygen been off it for about 3 years her nebulizer solution has run out.  And does not have a albuterol inhaler.  The neighbor family friend stated that the patient was wheezing earlier today.  They had originally brought her in mostly for these concerns.  As well as the abscess on the forearm unfortunately this was not relayed to [place].     Past Medical History:  Diagnosis Date  . Arthritis   . COPD (chronic obstructive pulmonary disease) ([place])   . Depression with anxiety 02/02/2018  . Diabetes mellitus   . High cholesterol   . Hypertension   . Mental disorder     Patient Active Problem List   Diagnosis Date Noted  . Elevated troponin   . Depression with anxiety 02/02/2018  . Hypertensive emergency  02/02/2018  . Flash pulmonary edema ([place]) 02/02/2018  . Encounter for routine gynecological examination with Papanicolaou smear of cervix 11/18/2016  . ANKLE SPRAIN 11/01/2008  . T2DM (type 2 diabetes mellitus) ([place]) 12/09/2007  . HLD (hyperlipidemia) 12/09/2007  . Essential hypertension 12/09/2007  . Asthma 12/09/2007  . GASTROESOPHAGEAL REFLUX DISEASE, CHRONIC 12/09/2007  . IRRITABLE BOWEL SYNDROME 12/09/2007  . SCOLIOSIS 12/09/2007  . ABDOMINAL BLOATING 12/09/2007  . ABDOMINAL PAIN 12/09/2007  . CARPAL TUNNEL RELEASE, HX OF 12/09/2007    Past Surgical History:  Procedure Laterality Date  . CHOLECYSTECTOMY    . KNEE SURGERY    . LEFT HEART CATH AND CORONARY ANGIOGRAPHY N/A 02/05/2018   Procedure: LEFT HEART CATH AND CORONARY ANGIOGRAPHY;  Surgeon: Burnell Blanks, MD;  Location: [facility];  Service: Cardiovascular;  Laterality: N/A;  . MULTIPLE EXTRACTIONS WITH ALVEOLOPLASTY  06/10/2011   Procedure: MULTIPLE EXTRACION WITH ALVEOLOPLASTY;  Surgeon: Gae Bon, DDS;  Location: [place];  Service: Oral Surgery;  Laterality: Bilateral;  Extractions of one,six,eight,nine,eleven,twenty,twenty-one,twenty-two,twenty-four,twenty-five,twenty-six,twenty-seven,twenty-eight,twenty-nine     OB History    Gravida  1   Para  1   Term      Preterm      AB      Living  1     SAB      TAB  Ectopic      Multiple      Live Births               Home Medications    Prior to Admission medications   Medication Sig Start Date End Date Taking? Authorizing Provider  acetaminophen (TYLENOL) 325 MG tablet Take 2 tablets (650 mg total) by mouth every 4 (four) hours as needed for headache or mild pain. 02/06/18   Kinnie Feil, MD  albuterol (PROVENTIL HFA;VENTOLIN HFA) 108 (90 Base) MCG/ACT inhaler Inhale 1 puff into the lungs every 6 (six) hours as needed for wheezing or shortness of breath.    [provider]  albuterol (PROVENTIL) (2.5 MG/3ML)  0.083% nebulizer solution Take 3 mLs (2.5 mg total) by nebulization every 6 (six) hours as needed for wheezing or shortness of breath. 02/11/18   Fredia Sorrow, MD  albuterol-ipratropium (COMBIVENT) 18-103 MCG/ACT inhaler Inhale 2 puffs into the lungs every 6 (six) hours as needed for wheezing.    [provider]  amLODipine (NORVASC) 10 MG tablet Take 1 tablet (10 mg total) by mouth daily. 02/07/18   Kinnie Feil, MD  aspirin EC 81 MG EC tablet Take 1 tablet (81 mg total) by mouth daily. 02/06/18   Kinnie Feil, MD  carvedilol (COREG) 12.5 MG tablet Take 1 tablet (12.5 mg total) by mouth 2 (two) times daily with a meal. 02/06/18   Buriev, Arie Sabina, MD  doxycycline (VIBRAMYCIN) 100 MG capsule Take 1 capsule (100 mg total) by mouth 2 (two) times daily. 02/11/18   Fredia Sorrow, MD  ezetimibe (ZETIA) 10 MG tablet Take 10 mg by mouth daily.     [provider]  FARXIGA 10 MG TABS tablet Take 10 mg by mouth daily.  02/07/18   [provider]  fenofibrate 160 MG tablet Take 1 tablet (160 mg total) by mouth daily. 02/06/18   Kinnie Feil, MD  furosemide (LASIX) 20 MG tablet Take 20 mg by mouth daily.     [provider]  labetalol (NORMODYNE) 200 MG tablet Take 200 mg by mouth 2 (two) times daily.    [provider]  lisinopril (PRINIVIL,ZESTRIL) 20 MG tablet Take 20 mg by mouth daily.    [provider]  metFORMIN (GLUCOPHAGE) 500 MG tablet Take by mouth 2 (two) times daily with a meal.    [provider]  naproxen (NAPROSYN) 500 MG tablet Take 500 mg by mouth 2 (two) times daily with a meal.  02/09/18   [provider]  oxyCODONE (OXY IR/ROXICODONE) 5 MG immediate release tablet Take 5 mg by mouth 3 (three) times daily.    [provider]  pantoprazole (PROTONIX) 40 MG tablet Take 40 mg by mouth daily.    [provider]  PARoxetine (PAXIL) 20 MG tablet Take 20 mg by mouth every morning.      [provider]  rosuvastatin (CRESTOR) 10 MG tablet Take 1 tablet (10 mg total) by mouth daily at 6 PM. 02/06/18   Buriev, Arie Sabina, MD  Vitamin D, Ergocalciferol, (DRISDOL) 1.25 MG (50000 UT) CAPS capsule Take 1 capsule (50,000 Units total) by mouth every 7 (seven) days. 02/06/18   Kinnie Feil, MD    Family History Family History  Problem Relation Age of Onset  . Anesthesia problems Neg Hx   . Hypotension Neg Hx   . Malignant hyperthermia Neg Hx   . Pseudochol deficiency Neg Hx     Social History Social  History   Tobacco Use  . Smoking status: Former Research scientist (life sciences)  . Smokeless tobacco: Never Used  Substance Use Topics  . Alcohol use: No  . Drug use: No     Allergies   Fluoxetine hcl; Methocarbamol; Metoclopramide hcl; Motrin [ibuprofen]; and Rabeprazole sodium   Review of Systems Review of Systems  Constitutional: Positive for diaphoresis. Negative for fever.  HENT: Negative for congestion.   Eyes: Negative for redness and visual disturbance.  Respiratory: Positive for shortness of breath and wheezing.   Cardiovascular: Negative for chest pain and leg swelling.  Gastrointestinal: Negative for abdominal pain.  Genitourinary: Negative for dysuria.  Musculoskeletal: Negative for neck stiffness.  Skin: Positive for wound.  Neurological: Negative for syncope.  Hematological: Does not bruise/bleed easily.  Psychiatric/Behavioral: Positive for confusion.     Physical Exam Updated Vital Signs BP (!) 190/81   Pulse 87   Temp 98 F (36.7 C) (Oral)   Resp (!) 25   Ht 1.448 m (4\' 9" )   Wt 58.2 kg   SpO2 95%   BMI 27.77 kg/m   Physical Exam  Constitutional: She appears well-developed. No distress.  HENT:  Head: Normocephalic and atraumatic.  Mouth/Throat: Oropharynx is clear and moist.  Eyes: Pupils are equal, round, and reactive to light. Conjunctivae are normal.  Neck: Neck supple.  Cardiovascular: Normal rate, regular rhythm and normal heart sounds.   Pulmonary/Chest: Effort normal and breath sounds normal. No respiratory distress. She has no wheezes. She has no rales.  Abdominal: Soft. Bowel sounds are normal. There is no tenderness.  Musculoskeletal:  Right forearm with bandage in place from the previous forearm abscess I&D.  Neurological: She is alert. No cranial nerve deficit or sensory deficit. She exhibits normal muscle tone. Coordination normal.  Skin: Skin is warm.  Nursing note and vitals reviewed.    ED Treatments / Results  Labs (all labs ordered are listed, but only abnormal results are displayed) Labs Reviewed  BASIC METABOLIC PANEL - Abnormal; Notable for the following components:      Result Value   CO2 21 (*)    Glucose, Bld 179 (*)    BUN 24 (*)    Creatinine, Ser 1.48 (*)    GFR calc non Af Amer 36 (*)    GFR calc Af Amer 42 (*)    All other components within normal limits  CBC WITH DIFFERENTIAL/PLATELET - Abnormal; Notable for the following components:   RBC 3.39 (*)    Hemoglobin 9.6 (*)    HCT 31.0 (*)    All other components within normal limits  BRAIN NATRIURETIC PEPTIDE - Abnormal; Notable for the following components:   B Natriuretic Peptide 1,028.0 (*)    All other components within normal limits  I-STAT TROPONIN, ED    EKG EKG Interpretation  Date/Time:  Wednesday February 11 2018 17:51:00 EST Ventricular Rate:  93 PR Interval:    QRS Duration: 91 QT Interval:  354 QTC Calculation: 441 R Axis:   79 Text Interpretation:  Sinus rhythm Nonspecific T abnrm, anterolateral leads Baseline wander in lead(s) II V2 V5 Confirmed by Fredia Sorrow [phone]) on 02/11/2018 5:57:46 PM   Radiology Dg Chest 2 View  Result Date: 02/11/2018 CLINICAL DATA:  Shortness of breath. COPD. EXAM: CHEST - 2 VIEW COMPARISON:  02/03/2018 FINDINGS: Stable mild enlargement of the cardiopericardial silhouette with indistinct pulmonary vasculature compatible with pulmonary venous hypertension. No definite Kerley B  lines. Subtle thickening of the fissures. Atherosclerotic calcification of the descending thoracic aorta.  IMPRESSION: 1. Cardiomegaly and pulmonary venous hypertension without overt edema. 2. Atherosclerosis. Electronically Signed   By: Van Clines M.D.   On: 02/11/2018 18:50    Procedures Procedures (including critical care time)  Medications Ordered in ED Medications  albuterol (PROVENTIL HFA;VENTOLIN HFA) 108 (90 Base) MCG/ACT inhaler 2 puff (has no administration in time range)  ipratropium-albuterol (DUONEB) 0.5-2.5 (3) MG/3ML nebulizer solution 3 mL (3 mLs Nebulization Given 02/11/18 1837)     Initial Impression / Assessment and Plan / ED Course  I have reviewed the triage vital signs and the nursing notes.  Pertinent labs & imaging results that were available during my care of the patient were reviewed by me and considered in my medical decision making (see chart for details).     Subsequent work-up here in the emergency department to address neighbor family friend concern.  Chest x-ray without any evidence of significant CHF or pulmonary edema.  Patient's BNP was elevated.  Troponin was normal.  EKG without acute changes.  Patient when she first got back to the room this time did seem to be a little winded.  However oxygen saturations were 94 to 97%.  Lungs were clear no wheezing.  Patient was given nebulizer treatment with improvement she was walked again no significant shortness of breath.  Oxygen saturations remained fine.  Patient also given 20 mg of Lasix IV.  Patient had her nebulizer solution renewed and also was given an albuterol inhaler here tonight.  Also explicitly given referral to cardiology.  Also recommended to increase her 20 mg of Lasix daily to twice a day.  Patient has a history of COPD as well as CHF.  The shortness of breath could have been a combination of either one or both.  Patient did show some evidence of a little bit of fluid overload so that was the  reason for the additional Lasix.  Patient nontoxic no acute distress.  Final Clinical Impressions(s) / ED Diagnoses   Final diagnoses:  None    ED Discharge Orders         Ordered    albuterol (PROVENTIL) (2.5 MG/3ML) 0.083% nebulizer solution  Every 6 hours PRN     02/11/18 1937           Fredia Sorrow, MD 02/11/18 2026

## 2018-02-11 NOTE — ED Triage Notes (Signed)
Patient with abscess to her R forearm.

## 2018-02-11 NOTE — Discharge Instructions (Signed)
Make an appointment to follow-up with cardiology.  Have renewed your albuterol nebulizer medication.  Also can use the albuterol inhaler when not able to use the nebulizer.  Make an appointment to follow-up with your regular doctor as well.  Make sure you are taking your Lasix would recommend increasing it from 20 mg once a day to 20 mg twice a day.  Return for any new or worse symptoms.

## 2018-02-11 NOTE — ED Provider Notes (Signed)
[facility] EMERGENCY DEPARTMENT Provider Note   CSN: [id number] Arrival date & time: 02/11/18  1439     History   Chief Complaint Chief Complaint  Patient presents with  . Abscess    HPI Kristen Jones is a 67 y.o. female.  Patient with abscess to her right forearm.  That she states is been present since her recent discharge from the hospital on November 15 or 16.  Patient was in for CHF exacerbation.  Patient states no other concerns then just that abscess.  Patient states that a couple days ago it hurt more is hurting less now.  Patient does have a history of diabetes.     Past Medical History:  Diagnosis Date  . Arthritis   . COPD (chronic obstructive pulmonary disease) ([place])   . Depression with anxiety 02/02/2018  . Diabetes mellitus   . High cholesterol   . Hypertension   . Mental disorder     Patient Active Problem List   Diagnosis Date Noted  . Elevated troponin   . Depression with anxiety 02/02/2018  . Hypertensive emergency 02/02/2018  . Flash pulmonary edema ([place]) 02/02/2018  . Encounter for routine gynecological examination with Papanicolaou smear of cervix 11/18/2016  . ANKLE SPRAIN 11/01/2008  . T2DM (type 2 diabetes mellitus) ([place]) 12/09/2007  . HLD (hyperlipidemia) 12/09/2007  . Essential hypertension 12/09/2007  . Asthma 12/09/2007  . GASTROESOPHAGEAL REFLUX DISEASE, CHRONIC 12/09/2007  . IRRITABLE BOWEL SYNDROME 12/09/2007  . SCOLIOSIS 12/09/2007  . ABDOMINAL BLOATING 12/09/2007  . ABDOMINAL PAIN 12/09/2007  . CARPAL TUNNEL RELEASE, HX OF 12/09/2007    Past Surgical History:  Procedure Laterality Date  . CHOLECYSTECTOMY    . KNEE SURGERY    . LEFT HEART CATH AND CORONARY ANGIOGRAPHY N/A 02/05/2018   Procedure: LEFT HEART CATH AND CORONARY ANGIOGRAPHY;  Surgeon: Burnell Blanks, MD;  Location: [place];  Service: Cardiovascular;  Laterality: N/A;  . MULTIPLE EXTRACTIONS WITH ALVEOLOPLASTY  06/10/2011   Procedure:  MULTIPLE EXTRACION WITH ALVEOLOPLASTY;  Surgeon: Gae Bon, DDS;  Location: [place];  Service: Oral Surgery;  Laterality: Bilateral;  Extractions of one,six,eight,nine,eleven,twenty,twenty-one,twenty-two,twenty-four,twenty-five,twenty-six,twenty-seven,twenty-eight,twenty-nine     OB History    Gravida  1   Para  1   Term      Preterm      AB      Living  1     SAB      TAB      Ectopic      Multiple      Live Births               Home Medications    Prior to Admission medications   Medication Sig Start Date End Date Taking? Authorizing Provider  acetaminophen (TYLENOL) 325 MG tablet Take 2 tablets (650 mg total) by mouth every 4 (four) hours as needed for headache or mild pain. 02/06/18  Yes Buriev, Arie Sabina, MD  albuterol (PROVENTIL HFA;VENTOLIN HFA) 108 (90 Base) MCG/ACT inhaler Inhale 1 puff into the lungs every 6 (six) hours as needed for wheezing or shortness of breath.   Yes [provider]  albuterol-ipratropium (COMBIVENT) 18-103 MCG/ACT inhaler Inhale 2 puffs into the lungs every 6 (six) hours as needed for wheezing.   Yes [provider]  amLODipine (NORVASC) 10 MG tablet Take 1 tablet (10 mg total) by mouth daily. 02/07/18  Yes Kinnie Feil, MD  aspirin EC 81 MG EC tablet Take 1 tablet (81 mg total) by  mouth daily. 02/06/18  Yes Kinnie Feil, MD  carvedilol (COREG) 12.5 MG tablet Take 1 tablet (12.5 mg total) by mouth 2 (two) times daily with a meal. 02/06/18  Yes Buriev, Arie Sabina, MD  ezetimibe (ZETIA) 10 MG tablet Take 10 mg by mouth daily.    Yes [provider]  FARXIGA 10 MG TABS tablet Take 10 mg by mouth daily.  02/07/18  Yes [provider]  fenofibrate 160 MG tablet Take 1 tablet (160 mg total) by mouth daily. 02/06/18  Yes Buriev, Arie Sabina, MD  furosemide (LASIX) 20 MG tablet Take 20 mg by mouth daily.    Yes [provider]  labetalol (NORMODYNE) 200 MG tablet Take 200 mg by mouth 2 (two)  times daily.   Yes [provider]  lisinopril (PRINIVIL,ZESTRIL) 20 MG tablet Take 20 mg by mouth daily.   Yes [provider]  metFORMIN (GLUCOPHAGE) 500 MG tablet Take by mouth 2 (two) times daily with a meal.   Yes [provider]  naproxen (NAPROSYN) 500 MG tablet Take 500 mg by mouth 2 (two) times daily with a meal.  02/09/18  Yes [provider]  oxyCODONE (OXY IR/ROXICODONE) 5 MG immediate release tablet Take 5 mg by mouth 3 (three) times daily.   Yes [provider]  pantoprazole (PROTONIX) 40 MG tablet Take 40 mg by mouth daily.   Yes [provider]  PARoxetine (PAXIL) 20 MG tablet Take 20 mg by mouth every morning.    Yes [provider]  rosuvastatin (CRESTOR) 10 MG tablet Take 1 tablet (10 mg total) by mouth daily at 6 PM. 02/06/18  Yes Buriev, Arie Sabina, MD  Vitamin D, Ergocalciferol, (DRISDOL) 1.25 MG (50000 UT) CAPS capsule Take 1 capsule (50,000 Units total) by mouth every 7 (seven) days. 02/06/18  Yes Buriev, Arie Sabina, MD  doxycycline (VIBRAMYCIN) 100 MG capsule Take 1 capsule (100 mg total) by mouth 2 (two) times daily. 02/11/18   Fredia Sorrow, MD    Family History Family History  Problem Relation Age of Onset  . Anesthesia problems Neg Hx   . Hypotension Neg Hx   . Malignant hyperthermia Neg Hx   . Pseudochol deficiency Neg Hx     Social History Social History   Tobacco Use  . Smoking status: Former Research scientist (life sciences)  . Smokeless tobacco: Never Used  Substance Use Topics  . Alcohol use: No  . Drug use: No     Allergies   Fluoxetine hcl; Methocarbamol; Metoclopramide hcl; Motrin [ibuprofen]; and Rabeprazole sodium   Review of Systems Review of Systems  Constitutional: Negative for fever.  HENT: Negative for congestion.   Eyes: Negative for redness.  Respiratory: Negative for shortness of breath.   Cardiovascular: Negative for chest pain.  Gastrointestinal: Negative for abdominal pain.    Genitourinary: Negative for dysuria.  Skin: Positive for wound.  Neurological: Negative for headaches.  Hematological: Does not bruise/bleed easily.  Psychiatric/Behavioral: Negative for confusion.     Physical Exam Updated Vital Signs BP (!) 166/95 (BP Location: Left Arm)   Pulse 86   Temp 97.9 F (36.6 C) (Oral)   Resp 20   Ht 1.448 m (4\' 9" )   Wt 58.2 kg   SpO2 97%   BMI 27.78 kg/m   Physical Exam  Constitutional: She appears well-developed and well-nourished. No distress.  Eyes: Pupils are equal, round, and reactive to light. EOM are normal.  Cardiovascular: Normal rate and regular rhythm.  Pulmonary/Chest: Effort normal and  breath sounds normal.  Abdominal: Soft. Bowel sounds are normal. There is no tenderness.  Musculoskeletal: She exhibits tenderness.  Right forearm posterior aspect with a about a 3 cm area induration.  About a 2 cm area of fluctuance.  Some slight erythema.  About mid forearm.  Radial pulse distally 2+.  Mild tenderness to palpation.  Erythema isolated to just around the area of indurations.  Neurological: She is alert. No cranial nerve deficit or sensory deficit. She exhibits normal muscle tone. Coordination normal.  Skin: Skin is warm. There is erythema.  Nursing note and vitals reviewed.    ED Treatments / Results  Labs (all labs ordered are listed, but only abnormal results are displayed) Labs Reviewed  CBG MONITORING, ED - Abnormal; Notable for the following components:      Result Value   Glucose-Capillary 166 (*)    All other components within normal limits    EKG None  Radiology No results found.  Procedures .Marland KitchenIncision and Drainage Date/Time: 02/11/2018 5:07 PM Performed by: Fredia Sorrow, MD Authorized by: Fredia Sorrow, MD   Consent:    Consent obtained:  Verbal   Consent given by:  Patient   Risks discussed:  Incomplete drainage, pain and infection Location:    Type:  Abscess   Location:  Upper extremity    Upper extremity location:  Arm   Arm location:  R lower arm Pre-procedure details:    Skin preparation:  Betadine Anesthesia (see MAR for exact dosages):    Anesthesia method:  None Procedure type:    Complexity:  Simple Procedure details:    Needle aspiration: no     Incision types:  Single straight   Incision depth:  Subcutaneous   Wound management:  Probed and deloculated   Drainage:  Purulent and bloody   Drainage amount:  Moderate   Wound treatment:  Wound left open   Packing materials:  None Post-procedure details:    Patient tolerance of procedure:  Tolerated well, no immediate complications   (including critical care time)  Medications Ordered in ED Medications  povidone-iodine (BETADINE) 10 % external solution (has no administration in time range)     Initial Impression / Assessment and Plan / ED Course  I have reviewed the triage vital signs and the nursing notes.  Pertinent labs & imaging results that were available during my care of the patient were reviewed by me and considered in my medical decision making (see chart for details).     Patient blood sugar here in the 160 range.  Patient's vital signs are not consistent with any concerns for sepsis.  Patient nontoxic no acute distress.  The right forearm abscess appears that has been present for a number of days there is an area of fluctuance.  So this was opened up with some purulent discharge no deep abscess cavity.  Patient will have dressing applied keep dressing in place wound recheck in 2 days.    Final Clinical Impressions(s) / ED Diagnoses   Final diagnoses:  Abscess    ED Discharge Orders         Ordered    doxycycline (VIBRAMYCIN) 100 MG capsule  2 times daily     02/11/18 1700           Fredia Sorrow, MD 02/11/18 1709

## 2018-02-11 NOTE — ED Notes (Signed)
Patient states she walked out of ER with discharge of previous ER visit and that's when she started with shortness of breath.

## 2018-02-11 NOTE — ED Triage Notes (Signed)
Pt was discharged and when she go to the car she was diaphoretic and very SOB.

## 2018-02-11 NOTE — ED Notes (Signed)
EDP in with pt 

## 2018-02-12 ENCOUNTER — Telehealth: Payer: Self-pay | Admitting: *Deleted

## 2018-02-12 NOTE — Telephone Encounter (Signed)
[facility] called for supporting documentation for nebulizer Rx.  [facility] faxed discharge note via [facility].

## 2018-02-13 ENCOUNTER — Emergency Department (HOSPITAL_COMMUNITY)
Admission: EM | Admit: 2018-02-13 | Discharge: 2018-02-13 | Disposition: A | Discharge status: Home / Self Care | Payer: Medicare Other | Attending: Emergency Medicine | Admitting: Emergency Medicine

## 2018-02-13 ENCOUNTER — Other Ambulatory Visit: Payer: Self-pay

## 2018-02-13 ENCOUNTER — Encounter (HOSPITAL_COMMUNITY): Payer: Self-pay | Admitting: Emergency Medicine

## 2018-02-13 DIAGNOSIS — Z09 Encounter for follow-up examination after completed treatment for conditions other than malignant neoplasm: Secondary | ICD-10-CM

## 2018-02-13 DIAGNOSIS — Z79899 Other long term (current) drug therapy: Secondary | ICD-10-CM | POA: Diagnosis not present

## 2018-02-13 DIAGNOSIS — E119 Type 2 diabetes mellitus without complications: Secondary | ICD-10-CM | POA: Insufficient documentation

## 2018-02-13 DIAGNOSIS — Z87891 Personal history of nicotine dependence: Secondary | ICD-10-CM | POA: Insufficient documentation

## 2018-02-13 DIAGNOSIS — I10 Essential (primary) hypertension: Secondary | ICD-10-CM | POA: Insufficient documentation

## 2018-02-13 DIAGNOSIS — Z7984 Long term (current) use of oral hypoglycemic drugs: Secondary | ICD-10-CM | POA: Insufficient documentation

## 2018-02-13 DIAGNOSIS — L02413 Cutaneous abscess of right upper limb: Secondary | ICD-10-CM | POA: Insufficient documentation

## 2018-02-13 DIAGNOSIS — J45909 Unspecified asthma, uncomplicated: Secondary | ICD-10-CM | POA: Insufficient documentation

## 2018-02-13 DIAGNOSIS — J449 Chronic obstructive pulmonary disease, unspecified: Secondary | ICD-10-CM | POA: Diagnosis not present

## 2018-02-13 MED ORDER — DOXYCYCLINE HYCLATE 100 MG PO CAPS: 100.0000 mg | ORAL_CAPSULE | Freq: Two times a day (BID) | ORAL | 0 refills | Status: AC

## 2018-02-13 NOTE — Discharge Instructions (Signed)
Continue washing with warm soapy water and change dressing daily.  Continue doxycycline until completed.  Please return the emergency department or see her doctor immediately if you develop any increasing pain, redness, swelling, red streaking from the area, or fever over 100.4.

## 2018-02-13 NOTE — ED Provider Notes (Signed)
[facility] EMERGENCY DEPARTMENT Provider Note   CSN: [id number] Arrival date & time: 02/13/18  1746     History   Chief Complaint Chief Complaint  Patient presents with  . Wound Check    HPI Kristen Jones is a 67 y.o. female with history of hypertension, diabetes who presents for wound check of abscess that was incised and drained 2 days ago.  Patient has had good healing and has been taking care of the wound.  She reports she only received 4 pills of doxycycline at the pharmacy and only has one left.  She denies any fevers.  She has had very minimal pain to the area.  HPI  Past Medical History:  Diagnosis Date  . Arthritis   . COPD (chronic obstructive pulmonary disease) ([place])   . Depression with anxiety 02/02/2018  . Diabetes mellitus   . High cholesterol   . Hypertension   . Mental disorder     Patient Active Problem List   Diagnosis Date Noted  . Elevated troponin   . Depression with anxiety 02/02/2018  . Hypertensive emergency 02/02/2018  . Flash pulmonary edema ([place]) 02/02/2018  . Encounter for routine gynecological examination with Papanicolaou smear of cervix 11/18/2016  . ANKLE SPRAIN 11/01/2008  . T2DM (type 2 diabetes mellitus) ([place]) 12/09/2007  . HLD (hyperlipidemia) 12/09/2007  . Essential hypertension 12/09/2007  . Asthma 12/09/2007  . GASTROESOPHAGEAL REFLUX DISEASE, CHRONIC 12/09/2007  . IRRITABLE BOWEL SYNDROME 12/09/2007  . SCOLIOSIS 12/09/2007  . ABDOMINAL BLOATING 12/09/2007  . ABDOMINAL PAIN 12/09/2007  . CARPAL TUNNEL RELEASE, HX OF 12/09/2007    Past Surgical History:  Procedure Laterality Date  . CHOLECYSTECTOMY    . KNEE SURGERY    . LEFT HEART CATH AND CORONARY ANGIOGRAPHY N/A 02/05/2018   Procedure: LEFT HEART CATH AND CORONARY ANGIOGRAPHY;  Surgeon: Burnell Blanks, MD;  Location: [facility];  Service: Cardiovascular;  Laterality: N/A;  . MULTIPLE EXTRACTIONS WITH ALVEOLOPLASTY  06/10/2011   Procedure: MULTIPLE  EXTRACION WITH ALVEOLOPLASTY;  Surgeon: Gae Bon, DDS;  Location: [facility];  Service: Oral Surgery;  Laterality: Bilateral;  Extractions of one,six,eight,nine,eleven,twenty,twenty-one,twenty-two,twenty-four,twenty-five,twenty-six,twenty-seven,twenty-eight,twenty-nine     OB History    Gravida  1   Para  1   Term      Preterm      AB      Living  1     SAB      TAB      Ectopic      Multiple      Live Births               Home Medications    Prior to Admission medications   Medication Sig Start Date End Date Taking? Authorizing Provider  acetaminophen (TYLENOL) 325 MG tablet Take 2 tablets (650 mg total) by mouth every 4 (four) hours as needed for headache or mild pain. 02/06/18   Kinnie Feil, MD  albuterol (PROVENTIL HFA;VENTOLIN HFA) 108 (90 Base) MCG/ACT inhaler Inhale 1 puff into the lungs every 6 (six) hours as needed for wheezing or shortness of breath.    [provider]  albuterol (PROVENTIL) (2.5 MG/3ML) 0.083% nebulizer solution Take 3 mLs (2.5 mg total) by nebulization every 6 (six) hours as needed for wheezing or shortness of breath. 02/11/18   Fredia Sorrow, MD  albuterol-ipratropium (COMBIVENT) 18-103 MCG/ACT inhaler Inhale 2 puffs into the lungs every 6 (six) hours as needed for wheezing.    [provider]  amLODipine (  NORVASC) 10 MG tablet Take 1 tablet (10 mg total) by mouth daily. 02/07/18   Kinnie Feil, MD  aspirin EC 81 MG EC tablet Take 1 tablet (81 mg total) by mouth daily. 02/06/18   Kinnie Feil, MD  carvedilol (COREG) 12.5 MG tablet Take 1 tablet (12.5 mg total) by mouth 2 (two) times daily with a meal. 02/06/18   Buriev, Arie Sabina, MD  doxycycline (VIBRAMYCIN) 100 MG capsule Take 1 capsule (100 mg total) by mouth 2 (two) times daily for 5 days. 02/13/18 02/18/18  Frederica Kuster, PA-C  ezetimibe (ZETIA) 10 MG tablet Take 10 mg by mouth daily.     [provider]  FARXIGA 10 MG TABS tablet Take  10 mg by mouth daily.  02/07/18   [provider]  fenofibrate 160 MG tablet Take 1 tablet (160 mg total) by mouth daily. 02/06/18   Kinnie Feil, MD  furosemide (LASIX) 20 MG tablet Take 20 mg by mouth daily.     [provider]  labetalol (NORMODYNE) 200 MG tablet Take 200 mg by mouth 2 (two) times daily.    [provider]  lisinopril (PRINIVIL,ZESTRIL) 20 MG tablet Take 20 mg by mouth daily.    [provider]  metFORMIN (GLUCOPHAGE) 500 MG tablet Take by mouth 2 (two) times daily with a meal.    [provider]  naproxen (NAPROSYN) 500 MG tablet Take 500 mg by mouth 2 (two) times daily with a meal.  02/09/18   [provider]  oxyCODONE (OXY IR/ROXICODONE) 5 MG immediate release tablet Take 5 mg by mouth 3 (three) times daily.    [provider]  pantoprazole (PROTONIX) 40 MG tablet Take 40 mg by mouth daily.    [provider]  PARoxetine (PAXIL) 20 MG tablet Take 20 mg by mouth every morning.     [provider]  rosuvastatin (CRESTOR) 10 MG tablet Take 1 tablet (10 mg total) by mouth daily at 6 PM. 02/06/18   Buriev, Arie Sabina, MD  Vitamin D, Ergocalciferol, (DRISDOL) 1.25 MG (50000 UT) CAPS capsule Take 1 capsule (50,000 Units total) by mouth every 7 (seven) days. 02/06/18   Kinnie Feil, MD    Family History Family History  Problem Relation Age of Onset  . Anesthesia problems Neg Hx   . Hypotension Neg Hx   . Malignant hyperthermia Neg Hx   . Pseudochol deficiency Neg Hx     Social History Social History   Tobacco Use  . Smoking status: Former Research scientist (life sciences)  . Smokeless tobacco: Never Used  Substance Use Topics  . Alcohol use: No  . Drug use: No     Allergies   Fluoxetine hcl; Methocarbamol; Metoclopramide hcl; Motrin [ibuprofen]; and Rabeprazole sodium   Review of Systems Review of Systems  Constitutional: Negative for fever.  Skin: Positive for wound.     Physical  Exam Updated Vital Signs BP (!) 159/66 (BP Location: Left Arm)   Pulse 81   Temp 98.6 F (37 C) (Oral)   Resp 18   Ht 4\' 9"  (1.448 m)   Wt 58.1 kg   SpO2 98%   BMI 27.70 kg/m   Physical Exam  Constitutional: She appears well-developed and well-nourished. No distress.  HENT:  Head: Normocephalic and atraumatic.  Mouth/Throat: Oropharynx is clear and moist. No oropharyngeal exudate.  Eyes: Pupils are equal, round, and reactive to light. Conjunctivae are normal. Right eye exhibits no discharge. Left eye exhibits no discharge.  No scleral icterus.  Neck: Normal range of motion. Neck supple. No thyromegaly present.  Cardiovascular: Normal rate, regular rhythm, normal heart sounds and intact distal pulses. Exam reveals no gallop and no friction rub.  No murmur heard. Pulmonary/Chest: Effort normal and breath sounds normal. No stridor. No respiratory distress. She has no wheezes. She has no rales.  Abdominal: Soft. Bowel sounds are normal. She exhibits no distension. There is no tenderness. There is no rebound and no guarding.  Musculoskeletal: She exhibits no edema.  Lymphadenopathy:    She has no cervical adenopathy.  Neurological: She is alert. Coordination normal.  Skin: Skin is warm and dry. No rash noted. She is not diaphoretic. No pallor.  Well-healing incised abscess to dorsal right forearm with open wound, minimal continued purulent drainage, no surrounding tenderness or erythema  Psychiatric: She has a normal mood and affect.  Nursing note and vitals reviewed.    ED Treatments / Results  Labs (all labs ordered are listed, but only abnormal results are displayed) Labs Reviewed - No data to display  EKG None  Radiology Dg Chest 2 View  Result Date: 02/11/2018 CLINICAL DATA:  Shortness of breath. COPD. EXAM: CHEST - 2 VIEW COMPARISON:  02/03/2018 FINDINGS: Stable mild enlargement of the cardiopericardial silhouette with indistinct pulmonary vasculature compatible with  pulmonary venous hypertension. No definite Kerley B lines. Subtle thickening of the fissures. Atherosclerotic calcification of the descending thoracic aorta. IMPRESSION: 1. Cardiomegaly and pulmonary venous hypertension without overt edema. 2. Atherosclerosis. Electronically Signed   By: Van Clines M.D.   On: 02/11/2018 18:50    Procedures Procedures (including critical care time)  Medications Ordered in ED Medications - No data to display   Initial Impression / Assessment and Plan / ED Course  I have reviewed the triage vital signs and the nursing notes.  Pertinent labs & imaging results that were available during my care of the patient were reviewed by me and considered in my medical decision making (see chart for details).     .Patient returns for check of recent incision and drainage. The region appears to be well-healing and infection appears to be resolving. Patient symptoms improved from prior visit. Afebrile and hemodynamically stable. Pt is instructed to continue with home care or antibiotics.  Patient reports she only received 4 pills of the week of doxycycline that was prescribed.  Unsure why this happened, however will prescribe 5 more days of doxycycline.  Pt has a good understanding of return precautions and is safe for discharge at this time.   Final Clinical Impressions(s) / ED Diagnoses   Final diagnoses:  Encounter for recheck of abscess following incision and drainage    ED Discharge Orders         Ordered    doxycycline (VIBRAMYCIN) 100 MG capsule  2 times daily     02/13/18 [address] PA-C 02/13/18 Milus Mallick, MD 02/14/18 [phone]

## 2018-02-13 NOTE — ED Triage Notes (Signed)
Patient states she was treated here on Wednesday for abscess to right forearm and told to return today for wound check.

## 2018-02-14 NOTE — Progress Notes (Deleted)
Cardiology Office Note   Date:  02/14/2018   ID:  Kristen Jones, DOB December 20, 1950, MRN [id number]  PCP:  Kristen Gaskins, MD  Cardiologist:  Dr. Bronson Ing No chief complaint on file.    History of Present Illness: Kristen Jones is a 67 y.o. female who presents for posthospitalization follow-up where he was seen as a new patient on consultation for chest pain, elevated troponin, with known history of hypertension, hyperlipidemia, type 2 diabetes, COPD, and anxiety. She was seen at [facility] emergency room for evaluation of worsening dyspnea.  He was found to be hypertensive with a blood pressure 169/107.  Chest x-ray revealed asymmetric edema versus interstitial pneumonia.  Echocardiogram completed on 02/02/2018 demonstrated normal cavity size with reduced LV function at 67%, grade 2 diastolic dysfunction, hypokinesis of the apical, septal, apical lateral, and apical myocardium.  There was moderately calcified annulus of the aortic valve along with the mitral valve.  She was recommended to be transferred to [facility] for cardiac catheterization.  On 02/05/2018 the patient did have a cardiac catheterization by Dr. Angelena Form.  This revealed a proximal RCA to mid RCA lesion of 40%, mid RCA to distal RCA lesion 30%, ostial circumflex to mid circumflex 20% stenosis, ostial ramus lesion of 40% stenosis, ramus lesion 20% stenosis, proximal LAD to mid LAD lesion 20% stenosed. She was recommended for medical management.    Past Medical History:  Diagnosis Date  . Arthritis   . COPD (chronic obstructive pulmonary disease) ([place])   . Depression with anxiety 02/02/2018  . Diabetes mellitus   . High cholesterol   . Hypertension   . Mental disorder     Past Surgical History:  Procedure Laterality Date  . CHOLECYSTECTOMY    . KNEE SURGERY    . LEFT HEART CATH AND CORONARY ANGIOGRAPHY N/A 02/05/2018   Procedure: LEFT HEART CATH AND CORONARY ANGIOGRAPHY;  Surgeon: Burnell Blanks, MD;   Location: [facility];  Service: Cardiovascular;  Laterality: N/A;  . MULTIPLE EXTRACTIONS WITH ALVEOLOPLASTY  06/10/2011   Procedure: MULTIPLE EXTRACION WITH ALVEOLOPLASTY;  Surgeon: Gae Bon, DDS;  Location: [facility];  Service: Oral Surgery;  Laterality: Bilateral;  Extractions of one,six,eight,nine,eleven,twenty,twenty-one,twenty-two,twenty-four,twenty-five,twenty-six,twenty-seven,twenty-eight,twenty-nine     Current Outpatient Medications  Medication Sig Dispense Refill  . acetaminophen (TYLENOL) 325 MG tablet Take 2 tablets (650 mg total) by mouth every 4 (four) hours as needed for headache or mild pain.    Marland Kitchen albuterol (PROVENTIL HFA;VENTOLIN HFA) 108 (90 Base) MCG/ACT inhaler Inhale 1 puff into the lungs every 6 (six) hours as needed for wheezing or shortness of breath.    Marland Kitchen albuterol (PROVENTIL) (2.5 MG/3ML) 0.083% nebulizer solution Take 3 mLs (2.5 mg total) by nebulization every 6 (six) hours as needed for wheezing or shortness of breath. 75 mL 12  . albuterol-ipratropium (COMBIVENT) 18-103 MCG/ACT inhaler Inhale 2 puffs into the lungs every 6 (six) hours as needed for wheezing.    Marland Kitchen amLODipine (NORVASC) 10 MG tablet Take 1 tablet (10 mg total) by mouth daily. 10 tablet 0  . aspirin EC 81 MG EC tablet Take 1 tablet (81 mg total) by mouth daily. 30 tablet 0  . carvedilol (COREG) 12.5 MG tablet Take 1 tablet (12.5 mg total) by mouth 2 (two) times daily with a meal. 60 tablet 0  . doxycycline (VIBRAMYCIN) 100 MG capsule Take 1 capsule (100 mg total) by mouth 2 (two) times daily for 5 days. 10 capsule 0  . ezetimibe (ZETIA) 10 MG tablet Take  10 mg by mouth daily.     Marland Kitchen FARXIGA 10 MG TABS tablet Take 10 mg by mouth daily.     . fenofibrate 160 MG tablet Take 1 tablet (160 mg total) by mouth daily. 30 tablet 0  . furosemide (LASIX) 20 MG tablet Take 20 mg by mouth daily.     Marland Kitchen labetalol (NORMODYNE) 200 MG tablet Take 200 mg by mouth 2 (two) times daily.    Marland Kitchen lisinopril  (PRINIVIL,ZESTRIL) 20 MG tablet Take 20 mg by mouth daily.    . metFORMIN (GLUCOPHAGE) 500 MG tablet Take by mouth 2 (two) times daily with a meal.    . naproxen (NAPROSYN) 500 MG tablet Take 500 mg by mouth 2 (two) times daily with a meal.     . oxyCODONE (OXY IR/ROXICODONE) 5 MG immediate release tablet Take 5 mg by mouth 3 (three) times daily.    . pantoprazole (PROTONIX) 40 MG tablet Take 40 mg by mouth daily.    Marland Kitchen PARoxetine (PAXIL) 20 MG tablet Take 20 mg by mouth every morning.     . rosuvastatin (CRESTOR) 10 MG tablet Take 1 tablet (10 mg total) by mouth daily at 6 PM. 30 tablet 0  . Vitamin D, Ergocalciferol, (DRISDOL) 1.25 MG (50000 UT) CAPS capsule Take 1 capsule (50,000 Units total) by mouth every 7 (seven) days. 5 capsule 10   No current facility-administered medications for this visit.     Allergies:   Fluoxetine hcl; Methocarbamol; Metoclopramide hcl; Motrin [ibuprofen]; and Rabeprazole sodium    Social History:  The patient  reports that she has quit smoking. She has never used smokeless tobacco. She reports that she does not drink alcohol or use drugs.   Family History:  The patient's family history is not on file.    ROS: All other systems are reviewed and negative. Unless otherwise mentioned in H&P    PHYSICAL EXAM: VS:  There were no vitals taken for this visit. , BMI There is no height or weight on file to calculate BMI. GEN: Well nourished, well developed, in no acute distress HEENT: normal Neck: no JVD, carotid bruits, or masses Cardiac: ***RRR; no murmurs, rubs, or gallops,no edema  Respiratory:  Clear to auscultation bilaterally, normal work of breathing GI: soft, nontender, nondistended, + BS MS: no deformity or atrophy Skin: warm and dry, no rash Neuro:  Strength and sensation are intact Psych: euthymic mood, full affect   EKG:  EKG {ACTION; IS/IS FXT:[id number]} ordered today. The ekg ordered today demonstrates ***   Recent Labs: 02/02/2018: TSH  3.143 02/03/2018: Magnesium 2.2 02/11/2018: B Natriuretic Peptide 1,028.0; BUN 24; Creatinine, Ser 1.48; Hemoglobin 9.6; Platelets 233; Potassium 4.5; Sodium 137    Lipid Panel    Component Value Date/Time   CHOL 99 02/03/2018 1036   TRIG 151 (H) 02/03/2018 1036   HDL 29 (L) 02/03/2018 1036   CHOLHDL 3.4 02/03/2018 1036   VLDL 30 02/03/2018 1036   LDLCALC 40 02/03/2018 1036      Wt Readings from Last 3 Encounters:  02/13/18 128 lb (58.1 kg)  02/11/18 128 lb 4.9 oz (58.2 kg)  02/11/18 128 lb 6.3 oz (58.2 kg)      Other studies Reviewed: Cardiac Cath 02/05/2018    Prox RCA to Mid RCA lesion is 40% stenosed.  Mid RCA to Dist RCA lesion is 30% stenosed.  Ost Cx to Mid Cx lesion is 20% stenosed.  Ost Ramus lesion is 40% stenosed.  Ramus lesion is 20% stenosed.  Prox LAD to Mid LAD lesion is 20% stenosed.   1. Mild to moderate non-obstructive CAD 2. Mild elevation LVEDP  Recommendation: Medical management of CAD.    Echocardiogram 02/02/2018  Left ventricle: The cavity size was normal. Systolic function was   mildly reduced. The estimated ejection fraction was 45%. Features   are consistent with a pseudonormal left ventricular filling   pattern, with concomitant abnormal relaxation and increased   filling pressure (grade 2 diastolic dysfunction). Doppler   parameters are consistent with high ventricular filling pressure.   Mild to moderate concentric LVH. - Regional wall motion abnormality: Hypokinesis of the apical   septal, apical lateral, and apical myocardium. - Aortic valve: Moderately calcified annulus. Trileaflet. There was   mild regurgitation. - Mitral valve: Mildly calcified annulus. There was mild   regurgitation. - Left atrium: The atrium was severely dilated. - Atrial septum: No defect or patent foramen ovale was identified. - Systemic veins: The IVC is dilated with normal respiratory   variation. Estimated right atrial pressure is 8  mmHg.   ASSESSMENT AND PLAN:  1.  ***   Current medicines are reviewed at length with the patient today.    Labs/ tests ordered today include: *** Phill Myron. [place] Pugh, ANP, AACC   02/14/2018 4:54 PM    [address] Office [phone] Fax [phone]

## 2018-02-16 ENCOUNTER — Ambulatory Visit: Payer: Medicare Other | Admitting: Adult Health

## 2018-02-26 ENCOUNTER — Emergency Department (HOSPITAL_COMMUNITY): Payer: Medicare Other

## 2018-02-26 ENCOUNTER — Other Ambulatory Visit: Payer: Self-pay

## 2018-02-26 ENCOUNTER — Encounter (HOSPITAL_COMMUNITY): Payer: Self-pay | Admitting: Emergency Medicine

## 2018-02-26 ENCOUNTER — Emergency Department (HOSPITAL_COMMUNITY)
Admission: EM | Admit: 2018-02-26 | Discharge: 2018-02-26 | Disposition: A | Discharge status: Home / Self Care | Payer: Medicare Other | Attending: Emergency Medicine | Admitting: Emergency Medicine

## 2018-02-26 DIAGNOSIS — J449 Chronic obstructive pulmonary disease, unspecified: Secondary | ICD-10-CM | POA: Diagnosis not present

## 2018-02-26 DIAGNOSIS — Y92009 Unspecified place in unspecified non-institutional (private) residence as the place of occurrence of the external cause: Secondary | ICD-10-CM | POA: Insufficient documentation

## 2018-02-26 DIAGNOSIS — S52615A Nondisplaced fracture of left ulna styloid process, initial encounter for closed fracture: Secondary | ICD-10-CM | POA: Diagnosis not present

## 2018-02-26 DIAGNOSIS — Z7984 Long term (current) use of oral hypoglycemic drugs: Secondary | ICD-10-CM | POA: Diagnosis not present

## 2018-02-26 DIAGNOSIS — I10 Essential (primary) hypertension: Secondary | ICD-10-CM | POA: Diagnosis not present

## 2018-02-26 DIAGNOSIS — Z87891 Personal history of nicotine dependence: Secondary | ICD-10-CM | POA: Insufficient documentation

## 2018-02-26 DIAGNOSIS — Z7982 Long term (current) use of aspirin: Secondary | ICD-10-CM | POA: Insufficient documentation

## 2018-02-26 DIAGNOSIS — Y9389 Activity, other specified: Secondary | ICD-10-CM | POA: Diagnosis not present

## 2018-02-26 DIAGNOSIS — S59912A Unspecified injury of left forearm, initial encounter: Secondary | ICD-10-CM | POA: Diagnosis present

## 2018-02-26 DIAGNOSIS — S52572A Other intraarticular fracture of lower end of left radius, initial encounter for closed fracture: Secondary | ICD-10-CM

## 2018-02-26 DIAGNOSIS — W08XXXA Fall from other furniture, initial encounter: Secondary | ICD-10-CM | POA: Insufficient documentation

## 2018-02-26 DIAGNOSIS — E119 Type 2 diabetes mellitus without complications: Secondary | ICD-10-CM | POA: Diagnosis not present

## 2018-02-26 DIAGNOSIS — Z79899 Other long term (current) drug therapy: Secondary | ICD-10-CM | POA: Diagnosis not present

## 2018-02-26 DIAGNOSIS — Y999 Unspecified external cause status: Secondary | ICD-10-CM | POA: Diagnosis not present

## 2018-02-26 MED ORDER — HYDROCODONE-ACETAMINOPHEN 5-325 MG PO TABS
1.0000 | ORAL_TABLET | Freq: Once | ORAL | Status: AC
  Administered 2018-02-26: 1 via ORAL
  Filled 2018-02-26: qty 1

## 2018-02-26 NOTE — Discharge Instructions (Signed)
Keep your splint dry.  Ice and elevation will help with pain and swelling.  Use your home oxycodone if needed for pain relief and your Naprosyn which you are currently taking.  Both of these medicines should help you with pain.  Get rechecked for any numbness or color changes in your fingertips or for any pain or problems with your splint.  Dr. Levell July office should call you, probably tomorrow to arrange an office visit for further management of your injury.  This is a fracture that will probably require surgery to repair as discussed.

## 2018-02-26 NOTE — ED Triage Notes (Signed)
Patient fell off a stool while hanging curtains, injury to her Left arm. Ecchymosis and edema noted.

## 2018-02-27 ENCOUNTER — Telehealth: Payer: Self-pay | Admitting: Orthopedic Surgery

## 2018-02-27 NOTE — Telephone Encounter (Signed)
Call received from patient and friend following visit to [facility] Emergency room for wrist fracture- per chart, patient is to see Dr Fredna Dow, orthopaedic hand surgeon. Voiced understanding, and will call this office (ph# confirmed).

## 2018-02-27 NOTE — ED Provider Notes (Signed)
[facility] EMERGENCY DEPARTMENT Provider Note   CSN: [id number] Arrival date & time: 02/26/18  1249     History   Chief Complaint Chief Complaint  Patient presents with  . Fall    HPI Kristen Jones is a 67 y.o. female with a history as outlined below, most significant for arthritis and DM, presenting with persistent left forearm and wrist pain with swelling and bruising after falling off a stool while trying to hand curtains in her home yesterday evening.  She denies weakness or numbness in her hand or fingers but has persistent pain at the injury site.  She denies left elbow and shoulder pain and denies any other injury from the fall including she did not hit her head and denies neck or back pain.  She expected to wake today with improvement in pain and swelling, but is unchanged. She take oxycodone tid for chronic pain.  She is right handed.  The history is provided by the patient and a friend.    Past Medical History:  Diagnosis Date  . Arthritis   . COPD (chronic obstructive pulmonary disease) ([place])   . Depression with anxiety 02/02/2018  . Diabetes mellitus   . High cholesterol   . Hypertension   . Mental disorder     Patient Active Problem List   Diagnosis Date Noted  . Elevated troponin   . Depression with anxiety 02/02/2018  . Hypertensive emergency 02/02/2018  . Flash pulmonary edema ([place]) 02/02/2018  . Encounter for routine gynecological examination with Papanicolaou smear of cervix 11/18/2016  . ANKLE SPRAIN 11/01/2008  . T2DM (type 2 diabetes mellitus) ([place]) 12/09/2007  . HLD (hyperlipidemia) 12/09/2007  . Essential hypertension 12/09/2007  . Asthma 12/09/2007  . GASTROESOPHAGEAL REFLUX DISEASE, CHRONIC 12/09/2007  . IRRITABLE BOWEL SYNDROME 12/09/2007  . SCOLIOSIS 12/09/2007  . ABDOMINAL BLOATING 12/09/2007  . ABDOMINAL PAIN 12/09/2007  . CARPAL TUNNEL RELEASE, HX OF 12/09/2007    Past Surgical History:  Procedure Laterality Date  .  CHOLECYSTECTOMY    . KNEE SURGERY    . LEFT HEART CATH AND CORONARY ANGIOGRAPHY N/A 02/05/2018   Procedure: LEFT HEART CATH AND CORONARY ANGIOGRAPHY;  Surgeon: Burnell Blanks, MD;  Location: [facility];  Service: Cardiovascular;  Laterality: N/A;  . MULTIPLE EXTRACTIONS WITH ALVEOLOPLASTY  06/10/2011   Procedure: MULTIPLE EXTRACION WITH ALVEOLOPLASTY;  Surgeon: Gae Bon, DDS;  Location: [place];  Service: Oral Surgery;  Laterality: Bilateral;  Extractions of one,six,eight,nine,eleven,twenty,twenty-one,twenty-two,twenty-four,twenty-five,twenty-six,twenty-seven,twenty-eight,twenty-nine     OB History    Gravida  1   Para  1   Term      Preterm      AB      Living  1     SAB      TAB      Ectopic      Multiple      Live Births               Home Medications    Prior to Admission medications   Medication Sig Start Date End Date Taking? Authorizing Provider  acetaminophen (TYLENOL) 325 MG tablet Take 2 tablets (650 mg total) by mouth every 4 (four) hours as needed for headache or mild pain. 02/06/18   Kinnie Feil, MD  albuterol (PROVENTIL HFA;VENTOLIN HFA) 108 (90 Base) MCG/ACT inhaler Inhale 1 puff into the lungs every 6 (six) hours as needed for wheezing or shortness of breath.    [provider]  albuterol (PROVENTIL) (2.5 MG/3ML)  0.083% nebulizer solution Take 3 mLs (2.5 mg total) by nebulization every 6 (six) hours as needed for wheezing or shortness of breath. 02/11/18   Fredia Sorrow, MD  albuterol-ipratropium (COMBIVENT) 18-103 MCG/ACT inhaler Inhale 2 puffs into the lungs every 6 (six) hours as needed for wheezing.    [provider]  amLODipine (NORVASC) 10 MG tablet Take 1 tablet (10 mg total) by mouth daily. 02/07/18   Kinnie Feil, MD  aspirin EC 81 MG EC tablet Take 1 tablet (81 mg total) by mouth daily. 02/06/18   Kinnie Feil, MD  carvedilol (COREG) 12.5 MG tablet Take 1 tablet (12.5 mg total) by mouth  2 (two) times daily with a meal. 02/06/18   Buriev, Arie Sabina, MD  ezetimibe (ZETIA) 10 MG tablet Take 10 mg by mouth daily.     [provider]  FARXIGA 10 MG TABS tablet Take 10 mg by mouth daily.  02/07/18   [provider]  fenofibrate 160 MG tablet Take 1 tablet (160 mg total) by mouth daily. 02/06/18   Kinnie Feil, MD  furosemide (LASIX) 20 MG tablet Take 20 mg by mouth daily.     [provider]  labetalol (NORMODYNE) 200 MG tablet Take 200 mg by mouth 2 (two) times daily.    [provider]  lisinopril (PRINIVIL,ZESTRIL) 20 MG tablet Take 20 mg by mouth daily.    [provider]  metFORMIN (GLUCOPHAGE) 500 MG tablet Take by mouth 2 (two) times daily with a meal.    [provider]  naproxen (NAPROSYN) 500 MG tablet Take 500 mg by mouth 2 (two) times daily with a meal.  02/09/18   [provider]  oxyCODONE (OXY IR/ROXICODONE) 5 MG immediate release tablet Take 5 mg by mouth 3 (three) times daily.    [provider]  pantoprazole (PROTONIX) 40 MG tablet Take 40 mg by mouth daily.    [provider]  PARoxetine (PAXIL) 20 MG tablet Take 20 mg by mouth every morning.     [provider]  rosuvastatin (CRESTOR) 10 MG tablet Take 1 tablet (10 mg total) by mouth daily at 6 PM. 02/06/18   Buriev, Arie Sabina, MD  Vitamin D, Ergocalciferol, (DRISDOL) 1.25 MG (50000 UT) CAPS capsule Take 1 capsule (50,000 Units total) by mouth every 7 (seven) days. 02/06/18   Kinnie Feil, MD    Family History Family History  Problem Relation Age of Onset  . Anesthesia problems Neg Hx   . Hypotension Neg Hx   . Malignant hyperthermia Neg Hx   . Pseudochol deficiency Neg Hx     Social History Social History   Tobacco Use  . Smoking status: Former Research scientist (life sciences)  . Smokeless tobacco: Never Used  Substance Use Topics  . Alcohol use: No  . Drug use: No     Allergies   Fluoxetine hcl; Methocarbamol;  Metoclopramide hcl; Motrin [ibuprofen]; and Rabeprazole sodium   Review of Systems Review of Systems  Constitutional: Negative for fever.  Musculoskeletal: Positive for arthralgias and joint swelling. Negative for myalgias.  Skin: Positive for color change. Negative for wound.  Neurological: Negative for weakness and numbness.     Physical Exam Updated Vital Signs BP (!) 177/74 (BP Location: Right Arm)   Pulse 75   Temp 98.2 F (36.8 C) (Oral)   Resp 17   SpO2 95%   Physical Exam  Constitutional: She appears well-developed and well-nourished.  HENT:  Head: Atraumatic.  Neck: Normal  range of motion.  Cardiovascular:  Pulses equal bilaterally  Musculoskeletal: She exhibits tenderness.       Left wrist: She exhibits decreased range of motion, bony tenderness and swelling. She exhibits no crepitus and no laceration.  Moderate bruising around the left wrist.  Radial pulse intact.  Less than 3 sec cap refill in all fingers.  Pt can flex/ext fingers and thumb with minimal discomfort.  Neurological: She is alert. She has normal strength. She displays normal reflexes. No sensory deficit.  Skin: Skin is warm and dry.  Psychiatric: She has a normal mood and affect.     ED Treatments / Results  Labs (all labs ordered are listed, but only abnormal results are displayed) Labs Reviewed - No data to display  EKG None  Radiology Dg Forearm Left  Result Date: 02/26/2018 CLINICAL DATA:  [place] off from stool last night with pain and swelling of the left arm and wrist EXAM: LEFT FOREARM - 2 VIEW COMPARISON:  None FINDINGS: There is and impacted comminuted fracture of the distal left radius which extends intra-articular to involve the radiocarpal joint space. The ulnar styloid may be fractured as well. The carpal bones are in normal position. There is slight widening of the scapholunate distance and a ligamentous injury is a consideration. The more proximal radius and ulna are intact and  normally aligned. Soft tissue swelling is present distally. IMPRESSION: 1. Impacted comminuted intra-articular fracture of the distal left radius. 2. Nondisplaced fracture of the ulnar styloid. 3. Somewhat widened scapholunate distance. Possible ligamentous injury. Electronically Signed   By: Ivar Drape M.D.   On: 02/26/2018 13:38   Dg Wrist Complete Left  Result Date: 02/26/2018 CLINICAL DATA:  Fall with left wrist pain and swelling. EXAM: LEFT WRIST - COMPLETE 3+ VIEW COMPARISON:  09/22/2006 FINDINGS: Displaced fracture of the distal radius. The distal radial fracture is displaced dorsally and proximally. This likely represents an intra-articular fracture. There is also a fracture of the ulnar styloid. Carpal bones are intact but there may be widening at the scapholunate interval. Soft tissue swelling. IMPRESSION: Displaced fracture of the distal radius. Significant dorsal and proximal displacement of the fracture. Concern for soft tissue injury at the scapholunate interval. Fracture of the ulnar styloid. Electronically Signed   By: Markus Daft M.D.   On: 02/26/2018 13:40    Procedures Procedures (including critical care time)  SPLINT APPLICATION Date/Time: 74/9/44 Authorized by: Evalee Jefferson Consent: Verbal consent obtained. Risks and benefits: risks, benefits and alternatives were discussed Consent given by: patient Splint applied by: RN Location details: left forearm Splint type: sugar tong Supplies used: webril, fiberglass, ace wraps, sling Post-procedure: The splinted body part was neurovascularly unchanged following the procedure. Patient tolerance: Patient tolerated the procedure well with no immediate complications.     Medications Ordered in ED Medications  HYDROcodone-acetaminophen (NORCO/VICODIN) 5-325 MG per tablet 1 tablet (1 tablet Oral Given 02/26/18 1438)     Initial Impression / Assessment and Plan / ED Course  I have reviewed the triage vital signs and the nursing  notes.  Pertinent labs & imaging results that were available during my care of the patient were reviewed by me and considered in my medical decision making (see chart for details).     Imaging reviewed and discussed with pt.  She has no neuro vascular compromise.  Discussed with Westley Chandler, RN with Dr. Fredna Dow who advised sugar tong/sling, f/u with him in his office.  Pt reports has seen Dr. Aline Brochure in the  past, but has been 5+ years. Will proceed with Dr Levell July referral and advised pt with her injury and suggestion of ligament/soft tissue injury she may need hand specialist for this injury. Pt understands.  She will call Dr. Aline Brochure in am to inquire.  I still suspect he will defer to hand specialist.  Attempt to contact Dr. Aline Brochure but he is not on call today and not in the office.   Final Clinical Impressions(s) / ED Diagnoses   Final diagnoses:  Other closed intra-articular fracture of distal end of left radius, initial encounter  Closed nondisplaced fracture of styloid process of left ulna, initial encounter    ED Discharge Orders    None       Landis Martins 02/27/18 1342    Milton Ferguson, MD 02/28/18 1029

## 2018-03-02 ENCOUNTER — Other Ambulatory Visit: Payer: Self-pay | Admitting: Orthopedic Surgery

## 2018-03-04 NOTE — Progress Notes (Signed)
Reviewed chart with Dr Lanetta Inch re: cardiac event 02-05-18. States patient will need cardiac evaluation prior to surgery. Notified Elmo Putt at Dr Levell July office.

## 2018-03-04 NOTE — Progress Notes (Signed)
Spoke with Jeani Hawking at Dr Levell July office. She is aware pt will need to be reevaluated by her cardiologist prior to surgery (per anesthesia) and will notify patient.

## 2018-03-05 ENCOUNTER — Telehealth: Payer: Self-pay | Admitting: *Deleted

## 2018-03-05 NOTE — Progress Notes (Signed)
Spoke with Jeani Hawking from [place] office, she does not think we will be able to get cards clearance for this pt in time for surgery (pt will need to see cardiologist for this to happen). Jeani Hawking will speak with Elmo Putt before removing her from schedule. Will continue to follow up at later time.

## 2018-03-05 NOTE — Telephone Encounter (Signed)
   [facility] Pre-operative Risk Assessment    Request for surgical clearance:  1. What type of surgery is being performed? OPEN REDUCTION INTERNAL FIXATION LEFT DISTAL RADIUS FRACTURE   2. When is this surgery scheduled?  03/10/18   3. What type of clearance is required (medical clearance vs. Pharmacy clearance to hold med vs. Both)?  BOTH  4. Are there any medications that need to be held prior to surgery and how long? ASPIRIN / NOT MENTIONED HOW LONG   5. Practice name and name of physician performing surgery?  [facility] / DR. Leanora Cover  6. What is your office phone number [phone]    7.   What is your office fax number [phone]  8.   Anesthesia type (None, local, MAC, general) ? CHOICE   Kristen Jones 03/05/2018, 11:26 AM  _________________________________________________________________   (provider comments below)

## 2018-03-09 NOTE — Telephone Encounter (Signed)
Please notify ortho office we have not been able to contact pt to give pre-op clearance.  Maybe they have another number for her.

## 2018-03-09 NOTE — Telephone Encounter (Signed)
   Primary Cardiologist: Kate Sable, MD  Chart reviewed as part of pre-operative protocol coverage. Given past medical history and time since last visit, based on ACC/AHA guidelines, Kristen Jones would be at acceptable risk for the planned procedure without further cardiovascular testing.  Pt had normal coronary arteries on cardiac cath.,  Her troponin was elevated due to hypertension.  It would be ok to hold asprin for 5 days if needed for surgery.  She is very hard of hearing and tells me she is to see ortho in [place].     I will route this recommendation to the requesting party via Epic fax function and remove from pre-op pool.  Please call with questions.  Cecilie Kicks, NP 03/09/2018, 3:05 PM

## 2018-03-10 ENCOUNTER — Ambulatory Visit (HOSPITAL_BASED_OUTPATIENT_CLINIC_OR_DEPARTMENT_OTHER): Admit: 2018-03-10 | Payer: Medicare Other | Admitting: Orthopedic Surgery

## 2018-03-10 ENCOUNTER — Encounter (HOSPITAL_BASED_OUTPATIENT_CLINIC_OR_DEPARTMENT_OTHER): Payer: Self-pay

## 2018-03-10 SURGERY — OPEN REDUCTION INTERNAL FIXATION (ORIF) DISTAL RADIUS FRACTURE
Anesthesia: Choice | Laterality: Left

## 2018-03-11 ENCOUNTER — Encounter (HOSPITAL_BASED_OUTPATIENT_CLINIC_OR_DEPARTMENT_OTHER): Payer: Self-pay | Admitting: *Deleted

## 2018-03-11 ENCOUNTER — Other Ambulatory Visit: Payer: Self-pay

## 2018-03-11 ENCOUNTER — Other Ambulatory Visit: Payer: Self-pay | Admitting: Orthopedic Surgery

## 2018-03-12 ENCOUNTER — Encounter (HOSPITAL_BASED_OUTPATIENT_CLINIC_OR_DEPARTMENT_OTHER)
Admission: RE | Disposition: A | Discharge status: Home / Self Care | Payer: Self-pay | Source: Home / Self Care | Attending: Orthopedic Surgery

## 2018-03-12 ENCOUNTER — Ambulatory Visit (HOSPITAL_BASED_OUTPATIENT_CLINIC_OR_DEPARTMENT_OTHER): Payer: Medicare Other | Admitting: Anesthesiology

## 2018-03-12 ENCOUNTER — Ambulatory Visit (HOSPITAL_BASED_OUTPATIENT_CLINIC_OR_DEPARTMENT_OTHER)
Admission: RE | Admit: 2018-03-12 | Discharge: 2018-03-13 | Disposition: A | Discharge status: Home / Self Care | Payer: Medicare Other | Attending: Orthopedic Surgery | Admitting: Orthopedic Surgery

## 2018-03-12 ENCOUNTER — Encounter (HOSPITAL_BASED_OUTPATIENT_CLINIC_OR_DEPARTMENT_OTHER): Payer: Self-pay

## 2018-03-12 ENCOUNTER — Other Ambulatory Visit: Payer: Self-pay

## 2018-03-12 DIAGNOSIS — W07XXXA Fall from chair, initial encounter: Secondary | ICD-10-CM | POA: Insufficient documentation

## 2018-03-12 DIAGNOSIS — E119 Type 2 diabetes mellitus without complications: Secondary | ICD-10-CM | POA: Diagnosis not present

## 2018-03-12 DIAGNOSIS — J449 Chronic obstructive pulmonary disease, unspecified: Secondary | ICD-10-CM | POA: Insufficient documentation

## 2018-03-12 DIAGNOSIS — Z791 Long term (current) use of non-steroidal anti-inflammatories (NSAID): Secondary | ICD-10-CM | POA: Insufficient documentation

## 2018-03-12 DIAGNOSIS — E7800 Pure hypercholesterolemia, unspecified: Secondary | ICD-10-CM | POA: Insufficient documentation

## 2018-03-12 DIAGNOSIS — S52572A Other intraarticular fracture of lower end of left radius, initial encounter for closed fracture: Secondary | ICD-10-CM | POA: Insufficient documentation

## 2018-03-12 DIAGNOSIS — Z7984 Long term (current) use of oral hypoglycemic drugs: Secondary | ICD-10-CM | POA: Diagnosis not present

## 2018-03-12 DIAGNOSIS — Z7982 Long term (current) use of aspirin: Secondary | ICD-10-CM | POA: Diagnosis not present

## 2018-03-12 DIAGNOSIS — I10 Essential (primary) hypertension: Secondary | ICD-10-CM | POA: Insufficient documentation

## 2018-03-12 DIAGNOSIS — Z886 Allergy status to analgesic agent status: Secondary | ICD-10-CM | POA: Insufficient documentation

## 2018-03-12 DIAGNOSIS — Z79899 Other long term (current) drug therapy: Secondary | ICD-10-CM | POA: Insufficient documentation

## 2018-03-12 DIAGNOSIS — Z87891 Personal history of nicotine dependence: Secondary | ICD-10-CM | POA: Diagnosis not present

## 2018-03-12 DIAGNOSIS — F329 Major depressive disorder, single episode, unspecified: Secondary | ICD-10-CM | POA: Insufficient documentation

## 2018-03-12 DIAGNOSIS — M1990 Unspecified osteoarthritis, unspecified site: Secondary | ICD-10-CM | POA: Insufficient documentation

## 2018-03-12 HISTORY — PX: OPEN REDUCTION INTERNAL FIXATION (ORIF) DISTAL RADIAL FRACTURE: SHX5989

## 2018-03-12 LAB — GLUCOSE, CAPILLARY: Glucose-Capillary: 154 mg/dL — ABNORMAL HIGH (ref 70–99)

## 2018-03-12 SURGERY — OPEN REDUCTION INTERNAL FIXATION (ORIF) DISTAL RADIUS FRACTURE
Anesthesia: General | Site: Wrist | Laterality: Left

## 2018-03-12 MED ORDER — LISINOPRIL 20 MG PO TABS
20.0000 mg | ORAL_TABLET | Freq: Every day | ORAL | Status: DC
  Administered 2018-03-12: 20 mg via ORAL
  Filled 2018-03-12: qty 1

## 2018-03-12 MED ORDER — ONDANSETRON HCL 4 MG/2ML IJ SOLN: 4.0000 mg | Freq: Four times a day (QID) | INTRAMUSCULAR | Status: DC | PRN

## 2018-03-12 MED ORDER — MIDAZOLAM HCL 2 MG/2ML IJ SOLN
1.0000 mg | INTRAMUSCULAR | Status: DC | PRN
  Administered 2018-03-12: 1 mg via INTRAVENOUS

## 2018-03-12 MED ORDER — ONDANSETRON HCL 4 MG/2ML IJ SOLN
INTRAMUSCULAR | Status: DC | PRN
  Administered 2018-03-12: 4 mg via INTRAVENOUS

## 2018-03-12 MED ORDER — FENOFIBRATE 160 MG PO TABS
160.0000 mg | ORAL_TABLET | Freq: Every day | ORAL | Status: DC
  Administered 2018-03-12: 160 mg via ORAL
  Filled 2018-03-12: qty 1

## 2018-03-12 MED ORDER — CEFAZOLIN SODIUM-DEXTROSE 2-4 GM/100ML-% IV SOLN
INTRAVENOUS | Status: AC
  Filled 2018-03-12: qty 100

## 2018-03-12 MED ORDER — EZETIMIBE 10 MG PO TABS
10.0000 mg | ORAL_TABLET | Freq: Every day | ORAL | Status: DC
  Filled 2018-03-12: qty 1

## 2018-03-12 MED ORDER — PANTOPRAZOLE SODIUM 40 MG PO TBEC
40.0000 mg | DELAYED_RELEASE_TABLET | Freq: Every day | ORAL | Status: DC
  Administered 2018-03-12: 40 mg via ORAL
  Filled 2018-03-12: qty 1

## 2018-03-12 MED ORDER — ZOLPIDEM TARTRATE 5 MG PO TABS: 5.0000 mg | ORAL_TABLET | Freq: Every evening | ORAL | Status: DC | PRN

## 2018-03-12 MED ORDER — VITAMIN D (ERGOCALCIFEROL) 1.25 MG (50000 UNIT) PO CAPS
50000.0000 [IU] | ORAL_CAPSULE | ORAL | Status: DC
  Administered 2018-03-12: 50000 [IU] via ORAL
  Filled 2018-03-12: qty 1

## 2018-03-12 MED ORDER — ONDANSETRON HCL 4 MG PO TABS: 4.0000 mg | ORAL_TABLET | Freq: Four times a day (QID) | ORAL | Status: DC | PRN

## 2018-03-12 MED ORDER — PROPOFOL 500 MG/50ML IV EMUL
INTRAVENOUS | Status: DC | PRN
  Administered 2018-03-12: 100 ug/kg/min via INTRAVENOUS

## 2018-03-12 MED ORDER — IPRATROPIUM-ALBUTEROL 18-103 MCG/ACT IN AERO: 2.0000 | INHALATION_SPRAY | Freq: Four times a day (QID) | RESPIRATORY_TRACT | Status: DC | PRN

## 2018-03-12 MED ORDER — LACTATED RINGERS IV SOLN
INTRAVENOUS | Status: DC
  Administered 2018-03-12: 13:00:00 via INTRAVENOUS

## 2018-03-12 MED ORDER — CANAGLIFLOZIN 100 MG PO TABS: 100.0000 mg | ORAL_TABLET | Freq: Every day | ORAL | Status: DC

## 2018-03-12 MED ORDER — SCOPOLAMINE 1 MG/3DAYS TD PT72: 1.0000 | MEDICATED_PATCH | Freq: Once | TRANSDERMAL | Status: DC | PRN

## 2018-03-12 MED ORDER — ALBUTEROL SULFATE HFA 108 (90 BASE) MCG/ACT IN AERS: 1.0000 | INHALATION_SPRAY | Freq: Four times a day (QID) | RESPIRATORY_TRACT | Status: DC | PRN

## 2018-03-12 MED ORDER — CEFAZOLIN SODIUM-DEXTROSE 1-4 GM/50ML-% IV SOLN
1.0000 g | Freq: Four times a day (QID) | INTRAVENOUS | Status: AC
  Administered 2018-03-12: 1 g via INTRAVENOUS
  Filled 2018-03-12 (×2): qty 50

## 2018-03-12 MED ORDER — CHLORHEXIDINE GLUCONATE 4 % EX LIQD: 60.0000 mL | Freq: Once | CUTANEOUS | Status: DC

## 2018-03-12 MED ORDER — FENTANYL CITRATE (PF) 100 MCG/2ML IJ SOLN
INTRAMUSCULAR | Status: AC
  Filled 2018-03-12: qty 2

## 2018-03-12 MED ORDER — HYDROCODONE-ACETAMINOPHEN 5-325 MG PO TABS: 1.0000 | ORAL_TABLET | ORAL | Status: DC | PRN

## 2018-03-12 MED ORDER — METFORMIN HCL 500 MG PO TABS
500.0000 mg | ORAL_TABLET | Freq: Two times a day (BID) | ORAL | Status: DC
  Administered 2018-03-12 – 2018-03-13 (×2): 500 mg via ORAL
  Filled 2018-03-12 (×2): qty 1

## 2018-03-12 MED ORDER — FUROSEMIDE 20 MG PO TABS
20.0000 mg | ORAL_TABLET | Freq: Every day | ORAL | Status: DC
  Administered 2018-03-12: 20 mg via ORAL
  Filled 2018-03-12: qty 1

## 2018-03-12 MED ORDER — CARVEDILOL 12.5 MG PO TABS
12.5000 mg | ORAL_TABLET | Freq: Two times a day (BID) | ORAL | Status: DC
  Administered 2018-03-12 – 2018-03-13 (×2): 12.5 mg via ORAL
  Filled 2018-03-12 (×2): qty 1

## 2018-03-12 MED ORDER — OXYCODONE HCL 5 MG PO TABS
5.0000 mg | ORAL_TABLET | Freq: Three times a day (TID) | ORAL | Status: DC
  Administered 2018-03-12: 5 mg via ORAL
  Filled 2018-03-12: qty 1

## 2018-03-12 MED ORDER — CEFAZOLIN SODIUM-DEXTROSE 2-4 GM/100ML-% IV SOLN
2.0000 g | INTRAVENOUS | Status: AC
  Administered 2018-03-12: 2 g via INTRAVENOUS

## 2018-03-12 MED ORDER — NAPROXEN 500 MG PO TABS
500.0000 mg | ORAL_TABLET | Freq: Two times a day (BID) | ORAL | Status: DC
  Administered 2018-03-12 – 2018-03-13 (×2): 500 mg via ORAL
  Filled 2018-03-12: qty 1
  Filled 2018-03-12: qty 2
  Filled 2018-03-12 (×3): qty 1

## 2018-03-12 MED ORDER — FENTANYL CITRATE (PF) 100 MCG/2ML IJ SOLN
50.0000 ug | INTRAMUSCULAR | Status: DC | PRN
  Administered 2018-03-12: 50 ug via INTRAVENOUS

## 2018-03-12 MED ORDER — MIDAZOLAM HCL 2 MG/2ML IJ SOLN
INTRAMUSCULAR | Status: AC
  Filled 2018-03-12: qty 2

## 2018-03-12 MED ORDER — ALBUTEROL SULFATE (2.5 MG/3ML) 0.083% IN NEBU: 2.5000 mg | INHALATION_SOLUTION | Freq: Four times a day (QID) | RESPIRATORY_TRACT | Status: DC | PRN

## 2018-03-12 MED ORDER — MORPHINE SULFATE (PF) 4 MG/ML IV SOLN: 1.0000 mg | INTRAVENOUS | Status: DC | PRN

## 2018-03-12 MED ORDER — LABETALOL HCL 200 MG PO TABS
200.0000 mg | ORAL_TABLET | Freq: Two times a day (BID) | ORAL | Status: DC
  Administered 2018-03-12: 200 mg via ORAL
  Filled 2018-03-12: qty 1

## 2018-03-12 MED ORDER — BUPIVACAINE-EPINEPHRINE (PF) 0.5% -1:200000 IJ SOLN
INTRAMUSCULAR | Status: DC | PRN
  Administered 2018-03-12: 30 mL via PERINEURAL

## 2018-03-12 MED ORDER — FENTANYL CITRATE (PF) 100 MCG/2ML IJ SOLN: 25.0000 ug | INTRAMUSCULAR | Status: DC | PRN

## 2018-03-12 MED ORDER — PAROXETINE HCL 20 MG PO TABS
20.0000 mg | ORAL_TABLET | ORAL | Status: DC
  Administered 2018-03-13: 20 mg via ORAL
  Filled 2018-03-12: qty 1

## 2018-03-12 MED ORDER — HYDROCODONE-ACETAMINOPHEN 5-325 MG PO TABS: ORAL_TABLET | ORAL | 0 refills | Status: DC

## 2018-03-12 MED ORDER — AMLODIPINE BESYLATE 10 MG PO TABS
10.0000 mg | ORAL_TABLET | Freq: Every day | ORAL | Status: DC
  Filled 2018-03-12: qty 1

## 2018-03-12 MED ORDER — ROSUVASTATIN CALCIUM 10 MG PO TABS
10.0000 mg | ORAL_TABLET | Freq: Every day | ORAL | Status: DC
  Administered 2018-03-12: 10 mg via ORAL
  Filled 2018-03-12 (×2): qty 1

## 2018-03-12 MED ORDER — ASPIRIN 81 MG PO TBEC: 81.0000 mg | DELAYED_RELEASE_TABLET | Freq: Every day | ORAL | Status: DC

## 2018-03-12 SURGICAL SUPPLY — 58 items
BANDAGE ACE 3X5.8 VEL STRL LF (GAUZE/BANDAGES/DRESSINGS) ×3 IMPLANT
BIT DRILL 2.0 LNG QUCK RELEASE (BIT) ×1 IMPLANT
BIT DRILL 2.8 QUICK RELEASE (BIT) ×1 IMPLANT
BLADE SURG 15 STRL LF DISP TIS (BLADE) ×2 IMPLANT
BLADE SURG 15 STRL SS (BLADE) ×4
BNDG ESMARK 4X9 LF (GAUZE/BANDAGES/DRESSINGS) ×3 IMPLANT
BNDG GAUZE ELAST 4 BULKY (GAUZE/BANDAGES/DRESSINGS) ×3 IMPLANT
BNDG PLASTER X FAST 3X3 WHT LF (CAST SUPPLIES) ×30 IMPLANT
BONE CHIP PRESERV 5CC PCAN5 (Bone Implant) ×3 IMPLANT
CHLORAPREP W/TINT 26ML (MISCELLANEOUS) ×3 IMPLANT
CORD BIPOLAR FORCEPS 12FT (ELECTRODE) ×3 IMPLANT
COVER BACK TABLE 60X90IN (DRAPES) ×3 IMPLANT
COVER MAYO STAND STRL (DRAPES) ×3 IMPLANT
COVER WAND RF STERILE (DRAPES) IMPLANT
CUFF TOURNIQUET SINGLE 18IN (TOURNIQUET CUFF) IMPLANT
CUFF TOURNIQUET SINGLE 24IN (TOURNIQUET CUFF) IMPLANT
DRAPE EXTREMITY T 121X128X90 (DRAPE) ×3 IMPLANT
DRAPE OEC MINIVIEW 54X84 (DRAPES) ×3 IMPLANT
DRAPE SURG 17X23 STRL (DRAPES) ×3 IMPLANT
DRILL 2.0 LNG QUICK RELEASE (BIT) ×3
DRILL 2.8 QUICK RELEASE (BIT) ×3
GAUZE SPONGE 4X4 12PLY STRL (GAUZE/BANDAGES/DRESSINGS) ×3 IMPLANT
GAUZE XEROFORM 1X8 LF (GAUZE/BANDAGES/DRESSINGS) ×3 IMPLANT
GLOVE BIO SURGEON STRL SZ7.5 (GLOVE) ×3 IMPLANT
GLOVE BIOGEL PI IND STRL 8 (GLOVE) ×1 IMPLANT
GLOVE BIOGEL PI IND STRL 8.5 (GLOVE) IMPLANT
GLOVE BIOGEL PI INDICATOR 8 (GLOVE) ×2
GLOVE BIOGEL PI INDICATOR 8.5 (GLOVE)
GLOVE SURG ORTHO 8.0 STRL STRW (GLOVE) IMPLANT
GOWN STRL REUS W/ TWL LRG LVL3 (GOWN DISPOSABLE) ×1 IMPLANT
GOWN STRL REUS W/TWL LRG LVL3 (GOWN DISPOSABLE) ×2
GOWN STRL REUS W/TWL XL LVL3 (GOWN DISPOSABLE) ×3 IMPLANT
GUIDEWIRE ORTHO 0.054X6 (WIRE) ×9 IMPLANT
NEEDLE HYPO 25X1 1.5 SAFETY (NEEDLE) IMPLANT
NS IRRIG 1000ML POUR BTL (IV SOLUTION) ×3 IMPLANT
PACK BASIN DAY SURGERY FS (CUSTOM PROCEDURE TRAY) ×3 IMPLANT
PAD CAST 3X4 CTTN HI CHSV (CAST SUPPLIES) ×1 IMPLANT
PADDING CAST COTTON 3X4 STRL (CAST SUPPLIES) ×2
PLATE PROX NARROW LEFT (Plate) ×3 IMPLANT
SCREW ACTK 2 NL HEX 3.5.11 (Screw) ×3 IMPLANT
SCREW BN FT 16X2.3XLCK HEX CRT (Screw) ×1 IMPLANT
SCREW CORT FT 18X2.3XLCK HEX (Screw) ×1 IMPLANT
SCREW CORTICAL LOCKING 2.3X16M (Screw) ×6 IMPLANT
SCREW CORTICAL LOCKING 2.3X18M (Screw) ×6 IMPLANT
SCREW FX16X2.3XLCK SMTH NS CRT (Screw) ×2 IMPLANT
SCREW FX18X2.3XSMTH LCK NS CRT (Screw) ×2 IMPLANT
SCREW NON LOCK 3.5X10MM (Screw) ×3 IMPLANT
SCREW NONLOCK HEX 3.5X12 (Screw) ×3 IMPLANT
SLEEVE SCD COMPRESS KNEE MED (MISCELLANEOUS) IMPLANT
SLING ARM FOAM STRAP MED (SOFTGOODS) ×3 IMPLANT
STOCKINETTE 4X48 STRL (DRAPES) ×3 IMPLANT
SUT ETHILON 4 0 PS 2 18 (SUTURE) ×3 IMPLANT
SUT VICRYL 4-0 PS2 18IN ABS (SUTURE) ×3 IMPLANT
SYR BULB 3OZ (MISCELLANEOUS) ×3 IMPLANT
SYR CONTROL 10ML LL (SYRINGE) IMPLANT
TOWEL GREEN STERILE FF (TOWEL DISPOSABLE) ×6 IMPLANT
TOWEL OR NON WOVEN STRL DISP B (DISPOSABLE) ×3 IMPLANT
UNDERPAD 30X30 (UNDERPADS AND DIAPERS) ×3 IMPLANT

## 2018-03-12 NOTE — Anesthesia Postprocedure Evaluation (Signed)
Anesthesia Post Note  Patient: Kristen Jones  Procedure(s) Performed: OPEN REDUCTION INTERNAL FIXATION (ORIF) DISTAL RADIAL FRACTURE (Left Wrist)     Patient location during evaluation: PACU Anesthesia Type: General Level of consciousness: sedated Pain management: pain level controlled Vital Signs Assessment: post-procedure vital signs reviewed and stable Respiratory status: spontaneous breathing and respiratory function stable Cardiovascular status: stable Postop Assessment: no apparent nausea or vomiting Anesthetic complications: no    Last Vitals:  Vitals:   03/12/18 1545 03/12/18 1600  BP: (!) 144/62 (!) 154/64  Pulse: 75 76  Resp: 19 16  Temp:    SpO2: 96% 96%    Last Pain:  Vitals:   03/12/18 1600  TempSrc:   PainSc: 0-No pain                 Ryoma Nofziger DANIEL

## 2018-03-12 NOTE — Discharge Instructions (Signed)

## 2018-03-12 NOTE — Transfer of Care (Signed)
Immediate Anesthesia Transfer of Care Note  Patient: Kristen Jones  Procedure(s) Performed: OPEN REDUCTION INTERNAL FIXATION (ORIF) DISTAL RADIAL FRACTURE (Left Wrist)  Patient Location: PACU  Anesthesia Type:GA combined with regional for post-op pain  Level of Consciousness: sedated  Airway & Oxygen Therapy: Patient Spontanous Breathing and Patient connected to face mask oxygen  Post-op Assessment: Report given to RN and Post -op Vital signs reviewed and stable  Post vital signs: Reviewed and stable  Last Vitals:  Vitals Value Taken Time  BP 109/54 03/12/2018  3:22 PM  Temp    Pulse 70 03/12/2018  3:24 PM  Resp 15 03/12/2018  3:24 PM  SpO2 100 % 03/12/2018  3:24 PM  Vitals shown include unvalidated device data.  Last Pain:  Vitals:   03/12/18 1226  TempSrc: Oral  PainSc: 6       Patients Stated Pain Goal: 3 (51/83/35 8251)  Complications: No apparent anesthesia complications

## 2018-03-12 NOTE — Anesthesia Procedure Notes (Signed)
Anesthesia Regional Block: Axillary brachial plexus block   Pre-Anesthetic Checklist: ,, timeout performed, Correct Patient, Correct Site, Correct Laterality, Correct Procedure, Correct Position, site marked, Risks and benefits discussed,  Surgical consent,  Pre-op evaluation,  At surgeon's request and post-op pain management  Laterality: Left  Prep: chloraprep       Needles:  Injection technique: Single-shot  Needle Type: Echogenic Needle     Needle Length: 9cm  Needle Gauge: 21     Additional Needles:   Procedures:, nerve stimulator,,, ultrasound used (permanent image in chart),,,,   Nerve Stimulator or Paresthesia:  Response: MC, Median, radial and ulnar responses noted, 0.5 mA,   Additional Responses:   Narrative:  Start time: 03/12/2018 1:12 PM End time: 03/12/2018 1:19 PM Injection made incrementally with aspirations every 5 mL.  Performed by: Personally  Anesthesiologist: Suzette Battiest, MD

## 2018-03-12 NOTE — Op Note (Signed)
I assisted Surgeon(s) and Role:    * Leanora Cover, MD - Primary    Daryll Brod, MD - Assisting on the Procedure(s): OPEN REDUCTION INTERNAL FIXATION (ORIF) DISTAL RADIAL FRACTURE on 03/12/2018.  I provided assistance on this case as follows: Set up, approach, identification of the fracture, debridement of the fracture, reduction and stabilization with bone grafting for fixation of the fracture closure of the wound and application of dressings and splint. Electronically signed by: Daryll Brod, MD Date: 03/12/2018 Time: 3:17 PM

## 2018-03-12 NOTE — Anesthesia Preprocedure Evaluation (Addendum)
Anesthesia Evaluation  Patient identified by MRN, date of birth, ID band Patient awake    Reviewed: Allergy & Precautions, NPO status , Patient's Chart, lab work & pertinent test results, reviewed documented beta blocker date and time   Airway Mallampati: II  TM Distance: >3 FB Neck ROM: Full    Dental  (+) Dental Advisory Given   Pulmonary asthma , COPD,  COPD inhaler, former smoker,    breath sounds clear to auscultation       Cardiovascular hypertension, Pt. on medications and Pt. on home beta blockers  Rhythm:Regular Rate:Normal  02/05/18 Cath. Mild-mod non-obstructive CAD. Med management recommended.   Neuro/Psych negative neurological ROS     GI/Hepatic Neg liver ROS, GERD  ,  Endo/Other  diabetes, Type 2  Renal/GU negative Renal ROS     Musculoskeletal  (+) Arthritis ,   Abdominal   Peds  Hematology negative hematology ROS (+)   Anesthesia Other Findings   Reproductive/Obstetrics                             Anesthesia Physical Anesthesia Plan  ASA: III  Anesthesia Plan: MAC and Regional   Post-op Pain Management:    Induction: Intravenous  PONV Risk Score and Plan: 3 and Dexamethasone, Ondansetron and Treatment may vary due to age or medical condition  Airway Management Planned: Natural Airway and Simple Face Mask  Additional Equipment:   Intra-op Plan:   Post-operative Plan:   Informed Consent: I have reviewed the patients History and Physical, chart, labs and discussed the procedure including the risks, benefits and alternatives for the proposed anesthesia with the patient or authorized representative who has indicated his/her understanding and acceptance.   Dental advisory given  Plan Discussed with: CRNA  Anesthesia Plan Comments:        Anesthesia Quick Evaluation

## 2018-03-12 NOTE — H&P (Signed)
Kristen Jones is an 67 y.o. female.   Chief Complaint: left distal radius fracture HPI: 67 yo female states she fell from a stool 03/12/18.  Seen in ED where XR revealed distal radius fracture.  Splinted and followed up in office.  She wishes to have operative fixation.  Allergies:  Allergies  Allergen Reactions  . Fluoxetine Hcl     REACTION: UNKNOWN REACTION  . Methocarbamol Nausea And Vomiting  . Metoclopramide Hcl     REACTION: UNKNOWN REACTION  . Motrin [Ibuprofen] Other (See Comments)    REACTION UNKNOWN  . Rabeprazole Sodium     REACTION: UNKNOWN REACTION    Past Medical History:  Diagnosis Date  . Arthritis   . COPD (chronic obstructive pulmonary disease) ([place])   . Depression with anxiety 02/02/2018  . Diabetes mellitus   . High cholesterol   . Hypertension   . Mental disorder     Past Surgical History:  Procedure Laterality Date  . CHOLECYSTECTOMY    . KNEE SURGERY    . LEFT HEART CATH AND CORONARY ANGIOGRAPHY N/A 02/05/2018   Procedure: LEFT HEART CATH AND CORONARY ANGIOGRAPHY;  Surgeon: Burnell Blanks, MD;  Location: [place];  Service: Cardiovascular;  Laterality: N/A;  . MULTIPLE EXTRACTIONS WITH ALVEOLOPLASTY  06/10/2011   Procedure: MULTIPLE EXTRACION WITH ALVEOLOPLASTY;  Surgeon: Gae Bon, DDS;  Location: [place];  Service: Oral Surgery;  Laterality: Bilateral;  Extractions of one,six,eight,nine,eleven,twenty,twenty-one,twenty-two,twenty-four,twenty-five,twenty-six,twenty-seven,twenty-eight,twenty-nine    Family History: Family History  Problem Relation Age of Onset  . Anesthesia problems Neg Hx   . Hypotension Neg Hx   . Malignant hyperthermia Neg Hx   . Pseudochol deficiency Neg Hx     Social History:   reports that she has quit smoking. She has never used smokeless tobacco. She reports that she does not drink alcohol or use drugs.  Medications: Medications Prior to Admission  Medication Sig Dispense Refill  .  albuterol (PROVENTIL HFA;VENTOLIN HFA) 108 (90 Base) MCG/ACT inhaler Inhale 1 puff into the lungs every 6 (six) hours as needed for wheezing or shortness of breath.    Marland Kitchen albuterol (PROVENTIL) (2.5 MG/3ML) 0.083% nebulizer solution Take 3 mLs (2.5 mg total) by nebulization every 6 (six) hours as needed for wheezing or shortness of breath. 75 mL 12  . amLODipine (NORVASC) 10 MG tablet Take 1 tablet (10 mg total) by mouth daily. 10 tablet 0  . aspirin EC 81 MG EC tablet Take 1 tablet (81 mg total) by mouth daily. 30 tablet 0  . carvedilol (COREG) 12.5 MG tablet Take 1 tablet (12.5 mg total) by mouth 2 (two) times daily with a meal. 60 tablet 0  . ezetimibe (ZETIA) 10 MG tablet Take 10 mg by mouth daily.     Marland Kitchen FARXIGA 10 MG TABS tablet Take 10 mg by mouth daily.     . fenofibrate 160 MG tablet Take 1 tablet (160 mg total) by mouth daily. 30 tablet 0  . furosemide (LASIX) 20 MG tablet Take 20 mg by mouth daily.     Marland Kitchen labetalol (NORMODYNE) 200 MG tablet Take 200 mg by mouth 2 (two) times daily.    Marland Kitchen lisinopril (PRINIVIL,ZESTRIL) 20 MG tablet Take 20 mg by mouth daily.    . metFORMIN (GLUCOPHAGE) 500 MG tablet Take by mouth 2 (two) times daily with a meal.    . oxyCODONE (OXY IR/ROXICODONE) 5 MG immediate release tablet Take 5 mg by mouth 3 (three) times daily.    Marland Kitchen  pantoprazole (PROTONIX) 40 MG tablet Take 40 mg by mouth daily.    Marland Kitchen PARoxetine (PAXIL) 20 MG tablet Take 20 mg by mouth every morning.     . rosuvastatin (CRESTOR) 10 MG tablet Take 1 tablet (10 mg total) by mouth daily at 6 PM. 30 tablet 0  . Vitamin D, Ergocalciferol, (DRISDOL) 1.25 MG (50000 UT) CAPS capsule Take 1 capsule (50,000 Units total) by mouth every 7 (seven) days. 5 capsule 10  . acetaminophen (TYLENOL) 325 MG tablet Take 2 tablets (650 mg total) by mouth every 4 (four) hours as needed for headache or mild pain.    Marland Kitchen albuterol-ipratropium (COMBIVENT) 18-103 MCG/ACT inhaler Inhale 2 puffs into the lungs every 6 (six) hours as  needed for wheezing.    . naproxen (NAPROSYN) 500 MG tablet Take 500 mg by mouth 2 (two) times daily with a meal.       No results found for this or any previous visit (from the past 48 hour(s)).  No results found.   A comprehensive review of systems was negative except for: Constitutional: positive for sweats Gastrointestinal: positive for diarrhea  Height 4\' 10"  (1.473 m), weight 59 kg.  General appearance: alert, cooperative and appears stated age Head: Normocephalic, without obvious abnormality, atraumatic Neck: supple, symmetrical, trachea midline Cardio: regular rate and rhythm Resp: clear to auscultation bilaterally Extremities: Intact sensation and capillary refill all digits.  +epl/fpl/io.  No wounds.  Pulses: 2+ and symmetric Skin: Skin color, texture, turgor normal. No rashes or lesions Neurologic: Grossly normal Incision/Wound: none  Assessment/Plan Left distal radius fracture.  Non operative and operative treatment options have been discussed with the patient and patient wishes to proceed with operative treatment. Risks, benefits, and alternatives of surgery have been discussed and the patient agrees with the plan of care.   Kristen Jones 03/12/2018, 12:13 PM

## 2018-03-12 NOTE — Progress Notes (Signed)
Assisted Dr. Rob Fitzgerald with left, ultrasound guided, axillary block. Side rails up, monitors on throughout procedure. See vital signs in flow sheet. Tolerated Procedure well. 

## 2018-03-12 NOTE — Op Note (Signed)
03/12/2018 [facility]  Operative Note  Pre Op Diagnosis: Left comminuted intraarticular distal radius fracture  Post Op Diagnosis: Left comminuted intraarticular distal radius fracture  Procedure: ORIF Left comminuted intraarticular distal radius fracture, 3 intraarticular fragments  Surgeon: Leanora Cover, MD  Assistant: Daryll Brod, MD  Anesthesia: Regional with sedation  Fluids: Per anesthesia flow sheet  EBL: minimal  Complications: None  Specimen: None  Tourniquet Time:  Total Tourniquet Time Documented: Upper Arm (Left) - 60 minutes Total: Upper Arm (Left) - 60 minutes   Disposition: Stable to PACU  INDICATIONS:  Kristen Jones is a 67 y.o. female states she fell 2 weeks ago injuring left wrist.  Seen in ED where XR revealed distal radius fracture.  Splinted and followed up in the office.  We discussed nonoperative and operative treatment options.  She wished to proceed with operative fixation.  Risks, benefits, and alternatives of surgery were discussed including the risk of blood loss; infection; damage to nerves, vessels, tendons, ligaments, bone; failure of surgery; need for additional surgery; complications with wound healing; continued pain; nonunion; malunion; stiffness.  We also discussed the possible need for bone graft and the benefits and risks including the possibility of disease transmission.  She voiced understanding of these risks and elected to proceed.    OPERATIVE COURSE:  After being identified preoperatively by myself, the patient and I agreed upon the procedure and site of procedure.  Surgical site was marked.  The risks, benefits and alternatives of the surgery were reviewed and she wished to proceed.  Surgical consent had been signed.  She was given IV Ancef as preoperative antibiotic prophylaxis.  She was transferred to the operating room and placed on the operating room table in supine position with the Left upper extremity on an  armboard. Sedation was induced by the anesthesiologist.  A regional block had been performed by anesthesia in preoperative holding.  The Left upper extremity was prepped and draped in normal sterile orthopedic fashion.  A surgical pause was performed between the surgeons, anesthesia and operating room staff, and all were in agreement as to the patient, procedure and site of procedure.  Tourniquet at the proximal aspect of the extremity was inflated to 250 mmHg after exsanguination of the limb with an Esmarch bandage.  Standard volar Mallie Mussel approach was used.  The bipolar electrocautery was used to obtain hemostasis.  The superficial and deep portions of the FCR tendon sheath were incised, and the FCR and FPL were swept ulnarly to protect the palmar cutaneous branch of the median nerve.  The brachioradialis was released at the radial side of the radius.  The pronator quadratus was released and elevated with the periosteal elevator.  The fracture site was identified and cleared of soft tissue interposition and hematoma.  It was reduced under direct visualization.  There were three intraarticular fragments including a radial styloid fragment and a dorsal fragment.   Cancellous bone graft was placed.   An AcuMed volar distal radial locking plate was selected.  It was secured to the bone with the guidepins.  C-arm was used in AP and lateral projections to ensure appropriate reduction and position of the hardware and adjustments made as necessary.  Standard AO drilling and measuring technique was used.  A single screw was placed in the slotted hole in the shaft of the plate.  The distal holes were filled with locking pegs with the exception of the styloid holes, which were filled with locking screws.  The  remaining holes in the shaft of the plate were filled with nonlocking screws.  Good purchase was obtained.  C-arm was used in AP, lateral and oblique projections to ensure appropriate reduction and position of hardware,  which was the case.  There was no intra-articular penetration of hardware.  The wound was copiously irrigated with sterile saline.  Pronator quadratus was repaired back over top of the plate using 4-0 Vicryl suture.  Vicryl suture was placed in the subcutaneous tissues in an inverted interrupted fashion and the skin was closed with 4-0 nylon in a horizontal mattress fashion.  There was good pronation and supination of the wrist without crepitance.  The wound was then dressed with sterile Xeroform, 4x4s, and wrapped with a Kerlix bandage.  A volar splint was placed and wrapped with Kerlix and Ace bandage.  Tourniquet was deflated at 60 minutes.  Fingertips were pink with brisk capillary refill after deflation of the tourniquet.  Operative drapes were broken down.  The patient was awoken from anesthesia safely.  She was transferred back to the stretcher and taken to the PACU in stable condition.  I will see her back in the office in one week for postoperative followup.  I will give her a prescription for Norco 5/325 1-2 tabs PO q6 hours prn pain, dispense # 30.    Leanora Cover, MD Electronically signed, 03/12/18

## 2018-03-13 ENCOUNTER — Encounter (HOSPITAL_BASED_OUTPATIENT_CLINIC_OR_DEPARTMENT_OTHER): Payer: Self-pay | Admitting: Orthopedic Surgery

## 2018-03-13 DIAGNOSIS — S52572A Other intraarticular fracture of lower end of left radius, initial encounter for closed fracture: Secondary | ICD-10-CM | POA: Diagnosis not present

## 2018-03-13 NOTE — Progress Notes (Signed)
Called Kristen Jones on 12/18 last pm and left a voicemail to pick patient up at 7:30 a.m. No one called back or came to see patient. This am, Kristen Jones was called again to pick patient up. Voicemail left to pick patient up. No one is returning calls. Patient states that she is estranged from family and they will not talk to her. Clinical social worker called.  Voucher given and [employer] called. Patient states that she has a hide a key at her house to get in. Setzer, [name]

## 2018-03-26 ENCOUNTER — Ambulatory Visit: Payer: Medicare Other | Admitting: Student

## 2018-03-26 NOTE — Progress Notes (Deleted)
Cardiology Office Note    Date:  03/26/2018   ID:  Demiya, Magno [birth year], MRN [id number]  PCP:  Lucia Gaskins, MD  Cardiologist: Kate Sable, MD    No chief complaint on file.   History of Present Illness:    Kristen Jones is a 68 y.o. female with past medical history of HTN, HLD, Type 2 DM, COPD, and anxiety who presents to the office today for hospital follow-up.  She was recently admitted to [facility] in 01/2018 for evaluation of worsening dyspnea and chest discomfort.  BP was initially elevated to 203/135 upon admission. Cyclic troponin values were found to be elevated and peaked at 1.13 with EKG showing T wave inversion along the anterior lateral leads. Echocardiogram showed her EF was reduced to 45% with grade 2 diastolic dysfunction and hypokinesis of the apical septal, apical lateral, and apical myocardium. She was later transferred to [facility] for a cardiac catheterization was performed on 02/05/2018 and showed mild to moderate nonobstructive CAD with continued medical management recommended. She was continued on ASA, Coreg 12.5 mg twice daily, statin, and Zetia. Also noted to have an AKI during admission (creatinine peaked at 1.88, improved to 1.26 at discharge) but was restarted on PTA Lisinopril at the time of discharge.     Past Medical History:  Diagnosis Date  . Arthritis   . COPD (chronic obstructive pulmonary disease) ([place])   . Depression with anxiety 02/02/2018  . Diabetes mellitus   . High cholesterol   . Hypertension   . Mental disorder     Past Surgical History:  Procedure Laterality Date  . CHOLECYSTECTOMY    . KNEE SURGERY    . LEFT HEART CATH AND CORONARY ANGIOGRAPHY N/A 02/05/2018   Procedure: LEFT HEART CATH AND CORONARY ANGIOGRAPHY;  Surgeon: Burnell Blanks, MD;  Location: [facility];  Service: Cardiovascular;  Laterality: N/A;  . MULTIPLE EXTRACTIONS WITH ALVEOLOPLASTY  06/10/2011   Procedure: MULTIPLE  EXTRACION WITH ALVEOLOPLASTY;  Surgeon: Gae Bon, DDS;  Location: [place];  Service: Oral Surgery;  Laterality: Bilateral;  Extractions of one,six,eight,nine,eleven,twenty,twenty-one,twenty-two,twenty-four,twenty-five,twenty-six,twenty-seven,twenty-eight,twenty-nine  . OPEN REDUCTION INTERNAL FIXATION (ORIF) DISTAL RADIAL FRACTURE Left 03/12/2018   Procedure: OPEN REDUCTION INTERNAL FIXATION (ORIF) DISTAL RADIAL FRACTURE;  Surgeon: Leanora Cover, MD;  Location: [facility];  Service: Orthopedics;  Laterality: Left;    Current Medications: Outpatient Medications Prior to Visit  Medication Sig Dispense Refill  . albuterol (PROVENTIL HFA;VENTOLIN HFA) 108 (90 Base) MCG/ACT inhaler Inhale 1 puff into the lungs every 6 (six) hours as needed for wheezing or shortness of breath.    Marland Kitchen albuterol (PROVENTIL) (2.5 MG/3ML) 0.083% nebulizer solution Take 3 mLs (2.5 mg total) by nebulization every 6 (six) hours as needed for wheezing or shortness of breath. 75 mL 12  . albuterol-ipratropium (COMBIVENT) 18-103 MCG/ACT inhaler Inhale 2 puffs into the lungs every 6 (six) hours as needed for wheezing.    Marland Kitchen amLODipine (NORVASC) 10 MG tablet Take 1 tablet (10 mg total) by mouth daily. 10 tablet 0  . aspirin EC 81 MG EC tablet Take 1 tablet (81 mg total) by mouth daily. 30 tablet 0  . carvedilol (COREG) 12.5 MG tablet Take 1 tablet (12.5 mg total) by mouth 2 (two) times daily with a meal. 60 tablet 0  . ezetimibe (ZETIA) 10 MG tablet Take 10 mg by mouth daily.     Marland Kitchen FARXIGA 10 MG TABS tablet Take 10 mg by mouth daily.     Marland Kitchen  fenofibrate 160 MG tablet Take 1 tablet (160 mg total) by mouth daily. 30 tablet 0  . furosemide (LASIX) 20 MG tablet Take 20 mg by mouth daily.     Marland Kitchen HYDROcodone-acetaminophen (NORCO) 5-325 MG tablet 1-2 tabs po q6 hours prn pain 30 tablet 0  . labetalol (NORMODYNE) 200 MG tablet Take 200 mg by mouth 2 (two) times daily.    Marland Kitchen lisinopril (PRINIVIL,ZESTRIL) 20 MG tablet Take 20 mg  by mouth daily.    . metFORMIN (GLUCOPHAGE) 500 MG tablet Take by mouth 2 (two) times daily with a meal.    . naproxen (NAPROSYN) 500 MG tablet Take 500 mg by mouth 2 (two) times daily with a meal.     . oxyCODONE (OXY IR/ROXICODONE) 5 MG immediate release tablet Take 5 mg by mouth 3 (three) times daily.    . pantoprazole (PROTONIX) 40 MG tablet Take 40 mg by mouth daily.    Marland Kitchen PARoxetine (PAXIL) 20 MG tablet Take 20 mg by mouth every morning.     . rosuvastatin (CRESTOR) 10 MG tablet Take 1 tablet (10 mg total) by mouth daily at 6 PM. 30 tablet 0  . Vitamin D, Ergocalciferol, (DRISDOL) 1.25 MG (50000 UT) CAPS capsule Take 1 capsule (50,000 Units total) by mouth every 7 (seven) days. 5 capsule 10   No facility-administered medications prior to visit.      Allergies:   Fluoxetine hcl; Methocarbamol; Metoclopramide hcl; Motrin [ibuprofen]; and Rabeprazole sodium   Social History   Socioeconomic History  . Marital status: Widowed    Spouse name: Not on file  . Number of children: Not on file  . Years of education: Not on file  . Highest education level: Not on file  Occupational History  . Not on file  Social Needs  . Financial resource strain: Not on file  . Food insecurity:    Worry: Not on file    Inability: Not on file  . Transportation needs:    Medical: Not on file    Non-medical: Not on file  Tobacco Use  . Smoking status: Former Research scientist (life sciences)  . Smokeless tobacco: Never Used  Substance and Sexual Activity  . Alcohol use: No  . Drug use: No  . Sexual activity: Not Currently  Lifestyle  . Physical activity:    Days per week: Not on file    Minutes per session: Not on file  . Stress: Not on file  Relationships  . Social connections:    Talks on phone: Not on file    Gets together: Not on file    Attends religious service: Not on file    Active member of club or organization: Not on file    Attends meetings of clubs or organizations: Not on file    Relationship status: Not  on file  Other Topics Concern  . Not on file  Social History Narrative  . Not on file     Family History:  The patient's ***family history is not on file.   Review of Systems:   Please see the history of present illness.     General:  No chills, fever, night sweats or weight changes.  Cardiovascular:  No chest pain, dyspnea on exertion, edema, orthopnea, palpitations, paroxysmal nocturnal dyspnea. Dermatological: No rash, lesions/masses Respiratory: No cough, dyspnea Urologic: No hematuria, dysuria Abdominal:   No nausea, vomiting, diarrhea, bright red blood per rectum, melena, or hematemesis Neurologic:  No visual changes, wkns, changes in mental status. All other systems reviewed  and are otherwise negative except as noted above.   Physical Exam:    VS:  There were no vitals taken for this visit.   General: Well developed, well nourished,female appearing in no acute distress. Head: Normocephalic, atraumatic, sclera non-icteric, no xanthomas, nares are without discharge.  Neck: No carotid bruits. JVD not elevated.  Lungs: Respirations regular and unlabored, without wheezes or rales.  Heart: ***Regular rate and rhythm. No S3 or S4.  No murmur, no rubs, or gallops appreciated. Abdomen: Soft, non-tender, non-distended with normoactive bowel sounds. No hepatomegaly. No rebound/guarding. No obvious abdominal masses. Msk:  Strength and tone appear normal for age. No joint deformities or effusions. Extremities: No clubbing or cyanosis. No edema.  Distal pedal pulses are 2+ bilaterally. Neuro: Alert and oriented X 3. Moves all extremities spontaneously. No focal deficits noted. Psych:  Responds to questions appropriately with a normal affect. Skin: No rashes or lesions noted  Wt Readings from Last 3 Encounters:  03/12/18 126 lb 1.7 oz (57.2 kg)  02/13/18 128 lb (58.1 kg)  02/11/18 128 lb 4.9 oz (58.2 kg)        Studies/Labs Reviewed:   EKG:  EKG is*** ordered today.  The ekg  ordered today demonstrates ***  Recent Labs: 02/02/2018: TSH 3.143 02/03/2018: Magnesium 2.2 02/11/2018: B Natriuretic Peptide 1,028.0; BUN 24; Creatinine, Ser 1.48; Hemoglobin 9.6; Platelets 233; Potassium 4.5; Sodium 137   Lipid Panel    Component Value Date/Time   CHOL 99 02/03/2018 1036   TRIG 151 (H) 02/03/2018 1036   HDL 29 (L) 02/03/2018 1036   CHOLHDL 3.4 02/03/2018 1036   VLDL 30 02/03/2018 1036   LDLCALC 40 02/03/2018 1036    Additional studies/ records that were reviewed today include:   Echocardiogram: 02/02/2018 Study Conclusions  - Left ventricle: The cavity size was normal. Systolic function was   mildly reduced. The estimated ejection fraction was 45%. Features   are consistent with a pseudonormal left ventricular filling   pattern, with concomitant abnormal relaxation and increased   filling pressure (grade 2 diastolic dysfunction). Doppler   parameters are consistent with high ventricular filling pressure.   Mild to moderate concentric LVH. - Regional wall motion abnormality: Hypokinesis of the apical   septal, apical lateral, and apical myocardium. - Aortic valve: Moderately calcified annulus. Trileaflet. There was   mild regurgitation. - Mitral valve: Mildly calcified annulus. There was mild   regurgitation. - Left atrium: The atrium was severely dilated. - Atrial septum: No defect or patent foramen ovale was identified. - Systemic veins: The IVC is dilated with normal respiratory   variation. Estimated right atrial pressure is 8 mmHg.  Cardiac Catheterization: 01/2018  Prox RCA to Mid RCA lesion is 40% stenosed.  Mid RCA to Dist RCA lesion is 30% stenosed.  Ost Cx to Mid Cx lesion is 20% stenosed.  Ost Ramus lesion is 40% stenosed.  Ramus lesion is 20% stenosed.  Prox LAD to Mid LAD lesion is 20% stenosed.   1. Mild to moderate non-obstructive CAD 2. Mild elevation LVEDP  Recommendation: Medical management of CAD.   Assessment:     No diagnosis found.   Plan:   In order of problems listed above:  1. ***    Medication Adjustments/Labs and Tests Ordered: Current medicines are reviewed at length with the patient today.  Concerns regarding medicines are outlined above.  Medication changes, Labs and Tests ordered today are listed in the Patient Instructions below. There are no Patient Instructions on file for  this visit.   Signed, Erma Heritage, PA-C  03/26/2018 7:34 AM    [facility] [address] [place], [place] [address] Phone: [phone]

## 2018-03-27 ENCOUNTER — Encounter: Payer: Self-pay | Admitting: Student

## 2018-03-27 LAB — POCT I-STAT, CHEM 8
BUN: 20 mg/dL (ref 8–23)
CHLORIDE: 107 mmol/L (ref 98–111)
Calcium, Ion: 1.25 mmol/L (ref 1.15–1.40)
Creatinine, Ser: 1.2 mg/dL — ABNORMAL HIGH (ref 0.44–1.00)
Glucose, Bld: 164 mg/dL — ABNORMAL HIGH (ref 70–99)
HCT: 34 % — ABNORMAL LOW (ref 36.0–46.0)
Hemoglobin: 11.6 g/dL — ABNORMAL LOW (ref 12.0–15.0)
Potassium: 4.5 mmol/L (ref 3.5–5.1)
Sodium: 140 mmol/L (ref 135–145)
TCO2: 24 mmol/L (ref 22–32)

## 2018-09-03 ENCOUNTER — Ambulatory Visit (HOSPITAL_COMMUNITY)
Admission: RE | Admit: 2018-09-03 | Discharge: 2018-09-03 | Disposition: A | Discharge status: Home / Self Care | Payer: Medicare Other | Source: Ambulatory Visit | Attending: Family Medicine | Admitting: Family Medicine

## 2018-09-03 ENCOUNTER — Other Ambulatory Visit (HOSPITAL_COMMUNITY): Payer: Self-pay | Admitting: Family Medicine

## 2018-09-03 ENCOUNTER — Other Ambulatory Visit: Payer: Self-pay

## 2018-09-03 DIAGNOSIS — M79672 Pain in left foot: Secondary | ICD-10-CM

## 2018-09-03 DIAGNOSIS — M79671 Pain in right foot: Secondary | ICD-10-CM

## 2018-09-19 ENCOUNTER — Other Ambulatory Visit: Payer: Self-pay

## 2018-09-19 ENCOUNTER — Emergency Department (HOSPITAL_COMMUNITY): Payer: Medicare Other

## 2018-09-19 ENCOUNTER — Encounter (HOSPITAL_COMMUNITY): Payer: Self-pay | Admitting: Emergency Medicine

## 2018-09-19 ENCOUNTER — Emergency Department (HOSPITAL_COMMUNITY)
Admission: EM | Admit: 2018-09-19 | Discharge: 2018-09-19 | Disposition: A | Discharge status: Home / Self Care | Payer: Medicare Other | Attending: Emergency Medicine | Admitting: Emergency Medicine

## 2018-09-19 DIAGNOSIS — Y929 Unspecified place or not applicable: Secondary | ICD-10-CM | POA: Insufficient documentation

## 2018-09-19 DIAGNOSIS — S6991XA Unspecified injury of right wrist, hand and finger(s), initial encounter: Secondary | ICD-10-CM | POA: Diagnosis present

## 2018-09-19 DIAGNOSIS — Y939 Activity, unspecified: Secondary | ICD-10-CM | POA: Diagnosis not present

## 2018-09-19 DIAGNOSIS — I10 Essential (primary) hypertension: Secondary | ICD-10-CM | POA: Diagnosis not present

## 2018-09-19 DIAGNOSIS — S52531A Colles' fracture of right radius, initial encounter for closed fracture: Secondary | ICD-10-CM | POA: Insufficient documentation

## 2018-09-19 DIAGNOSIS — Z7982 Long term (current) use of aspirin: Secondary | ICD-10-CM | POA: Insufficient documentation

## 2018-09-19 DIAGNOSIS — J449 Chronic obstructive pulmonary disease, unspecified: Secondary | ICD-10-CM | POA: Diagnosis not present

## 2018-09-19 DIAGNOSIS — W010XXA Fall on same level from slipping, tripping and stumbling without subsequent striking against object, initial encounter: Secondary | ICD-10-CM | POA: Diagnosis not present

## 2018-09-19 DIAGNOSIS — Z79899 Other long term (current) drug therapy: Secondary | ICD-10-CM | POA: Diagnosis not present

## 2018-09-19 DIAGNOSIS — Z87891 Personal history of nicotine dependence: Secondary | ICD-10-CM | POA: Insufficient documentation

## 2018-09-19 DIAGNOSIS — Y999 Unspecified external cause status: Secondary | ICD-10-CM | POA: Diagnosis not present

## 2018-09-19 DIAGNOSIS — E119 Type 2 diabetes mellitus without complications: Secondary | ICD-10-CM | POA: Insufficient documentation

## 2018-09-19 DIAGNOSIS — Z7984 Long term (current) use of oral hypoglycemic drugs: Secondary | ICD-10-CM | POA: Diagnosis not present

## 2018-09-19 NOTE — ED Triage Notes (Signed)
Patient c/o right wirst/ forearm pain. Per patient fell this morning at 4am. Patient states "I turned to fast and right ankle gave out on me." Patient tried to catch herself with arm. Bruising with deformity noted.

## 2018-09-19 NOTE — Discharge Instructions (Addendum)
Keep your arm elevated when possible.  Call Dr. Ruthe Mannan office on Monday morning to arrange a follow-up appt.

## 2018-09-19 NOTE — ED Notes (Addendum)
TT in to assess  Ice to arm   To Rad   Deformity and extensive bruising noted to arm

## 2018-09-19 NOTE — ED Notes (Signed)
Dr Earnest Conroy in to assess pt

## 2018-09-19 NOTE — ED Provider Notes (Signed)
[facility] EMERGENCY DEPARTMENT Provider Note   CSN: [id number] Arrival date & time: 09/19/18  1504     History   Chief Complaint Chief Complaint  Patient presents with  . Wrist Pain    HPI Kristen Jones is a 68 y.o. female.     HPI   Kristen Jones is a 68 y.o. female who presents to the Emergency Department complaining of pain, swelling and bruising to the right wrist.  States that her ankle "gave away" this morning.  Causing a fall in which she landed on her right wrist and hand.  She reports persistent pain and swelling of her wrist and increasing pain associated with movement.  She also reports chronic pain to her right ankle and foot, but denies any increasing pain or injury associated with her fall this morning.  She denies numbness of her hand of fingers, elbow or shoulder pain, head injury, LOC, neck or chest pain.      Past Medical History:  Diagnosis Date  . Arthritis   . COPD (chronic obstructive pulmonary disease) ([place])   . Depression with anxiety 02/02/2018  . Diabetes mellitus   . High cholesterol   . Hypertension   . Mental disorder     Patient Active Problem List   Diagnosis Date Noted  . Oth intartic fracture of lower end of left radius, init 03/12/2018  . Elevated troponin   . Depression with anxiety 02/02/2018  . Hypertensive emergency 02/02/2018  . Flash pulmonary edema ([place]) 02/02/2018  . Encounter for routine gynecological examination with Papanicolaou smear of cervix 11/18/2016  . ANKLE SPRAIN 11/01/2008  . T2DM (type 2 diabetes mellitus) ([place]) 12/09/2007  . HLD (hyperlipidemia) 12/09/2007  . Essential hypertension 12/09/2007  . Asthma 12/09/2007  . GASTROESOPHAGEAL REFLUX DISEASE, CHRONIC 12/09/2007  . IRRITABLE BOWEL SYNDROME 12/09/2007  . SCOLIOSIS 12/09/2007  . ABDOMINAL BLOATING 12/09/2007  . ABDOMINAL PAIN 12/09/2007  . CARPAL TUNNEL RELEASE, HX OF 12/09/2007    Past Surgical History:  Procedure Laterality Date  .  CHOLECYSTECTOMY    . KNEE SURGERY    . LEFT HEART CATH AND CORONARY ANGIOGRAPHY N/A 02/05/2018   Procedure: LEFT HEART CATH AND CORONARY ANGIOGRAPHY;  Surgeon: Burnell Blanks, MD;  Location: [place] [facility];  Service: Cardiovascular;  Laterality: N/A;  . MULTIPLE EXTRACTIONS WITH ALVEOLOPLASTY  06/10/2011   Procedure: MULTIPLE EXTRACION WITH ALVEOLOPLASTY;  Surgeon: Gae Bon, DDS;  Location: [place];  Service: Oral Surgery;  Laterality: Bilateral;  Extractions of one,six,eight,nine,eleven,twenty,twenty-one,twenty-two,twenty-four,twenty-five,twenty-six,twenty-seven,twenty-eight,twenty-nine  . OPEN REDUCTION INTERNAL FIXATION (ORIF) DISTAL RADIAL FRACTURE Left 03/12/2018   Procedure: OPEN REDUCTION INTERNAL FIXATION (ORIF) DISTAL RADIAL FRACTURE;  Surgeon: Leanora Cover, MD;  Location: [place];  Service: Orthopedics;  Laterality: Left;  . WRIST FRACTURE SURGERY Left      OB History    Gravida  1   Para  1   Term      Preterm      AB      Living  1     SAB      TAB      Ectopic      Multiple      Live Births               Home Medications    Prior to Admission medications   Medication Sig Start Date End Date Taking? Authorizing Provider  albuterol (PROVENTIL HFA;VENTOLIN HFA) 108 (90 Base) MCG/ACT inhaler Inhale 1 puff into the lungs every 6 (  six) hours as needed for wheezing or shortness of breath.    [provider]  albuterol (PROVENTIL) (2.5 MG/3ML) 0.083% nebulizer solution Take 3 mLs (2.5 mg total) by nebulization every 6 (six) hours as needed for wheezing or shortness of breath. 02/11/18   Fredia Sorrow, MD  albuterol-ipratropium (COMBIVENT) 18-103 MCG/ACT inhaler Inhale 2 puffs into the lungs every 6 (six) hours as needed for wheezing.    [provider]  amLODipine (NORVASC) 10 MG tablet Take 1 tablet (10 mg total) by mouth daily. 02/07/18   Kinnie Feil, MD  aspirin EC 81 MG EC tablet Take 1 tablet (81  mg total) by mouth daily. 02/06/18   Kinnie Feil, MD  carvedilol (COREG) 12.5 MG tablet Take 1 tablet (12.5 mg total) by mouth 2 (two) times daily with a meal. 02/06/18   Buriev, Arie Sabina, MD  ezetimibe (ZETIA) 10 MG tablet Take 10 mg by mouth daily.     [provider]  FARXIGA 10 MG TABS tablet Take 10 mg by mouth daily.  02/07/18   [provider]  fenofibrate 160 MG tablet Take 1 tablet (160 mg total) by mouth daily. 02/06/18   Kinnie Feil, MD  furosemide (LASIX) 20 MG tablet Take 20 mg by mouth daily.     [provider]  HYDROcodone-acetaminophen (NORCO) 5-325 MG tablet 1-2 tabs po q6 hours prn pain 03/12/18   Leanora Cover, MD  labetalol (NORMODYNE) 200 MG tablet Take 200 mg by mouth 2 (two) times daily.    [provider]  lisinopril (PRINIVIL,ZESTRIL) 20 MG tablet Take 20 mg by mouth daily.    [provider]  metFORMIN (GLUCOPHAGE) 500 MG tablet Take by mouth 2 (two) times daily with a meal.    [provider]  naproxen (NAPROSYN) 500 MG tablet Take 500 mg by mouth 2 (two) times daily with a meal.  02/09/18   [provider]  oxyCODONE (OXY IR/ROXICODONE) 5 MG immediate release tablet Take 5 mg by mouth 3 (three) times daily.    [provider]  pantoprazole (PROTONIX) 40 MG tablet Take 40 mg by mouth daily.    [provider]  PARoxetine (PAXIL) 20 MG tablet Take 20 mg by mouth every morning.     [provider]  rosuvastatin (CRESTOR) 10 MG tablet Take 1 tablet (10 mg total) by mouth daily at 6 PM. 02/06/18   Buriev, Arie Sabina, MD  Vitamin D, Ergocalciferol, (DRISDOL) 1.25 MG (50000 UT) CAPS capsule Take 1 capsule (50,000 Units total) by mouth every 7 (seven) days. 02/06/18   Kinnie Feil, MD    Family History Family History  Problem Relation Age of Onset  . Anesthesia problems Neg Hx   . Hypotension Neg Hx   . Malignant hyperthermia Neg Hx   . Pseudochol deficiency Neg Hx      Social History Social History   Tobacco Use  . Smoking status: Former Smoker    Types: Cigarettes  . Smokeless tobacco: Never Used  Substance Use Topics  . Alcohol use: No  . Drug use: No     Allergies   Fluoxetine hcl, Methocarbamol, Metoclopramide hcl, Motrin [ibuprofen], and Rabeprazole sodium   Review of Systems Review of Systems  Constitutional: Negative for chills and fever.  Eyes: Negative for visual disturbance.  Respiratory: Negative for chest tightness and shortness of breath.   Cardiovascular: Negative for chest pain.  Musculoskeletal: Positive for arthralgias (right wrist pain and swelling) and joint  swelling. Negative for neck pain.  Skin: Negative for wound.  Neurological: Negative for dizziness, syncope, weakness, numbness and headaches.     Physical Exam Updated Vital Signs BP (!) 179/77 (BP Location: Left Arm)   Pulse 76   Temp 98.2 F (36.8 C) (Oral)   Resp 18   Ht 4\' 10"  (1.473 m)   Wt 58.5 kg   SpO2 99%   BMI 26.96 kg/m   Physical Exam Vitals signs and nursing note reviewed.  Constitutional:      General: She is not in acute distress.    Appearance: Normal appearance.  HENT:     Head: Atraumatic.  Eyes:     Extraocular Movements: Extraocular movements intact.     Pupils: Pupils are equal, round, and reactive to light.  Neck:     Musculoskeletal: Normal range of motion. No muscular tenderness.  Cardiovascular:     Rate and Rhythm: Normal rate and regular rhythm.     Pulses: Normal pulses.  Pulmonary:     Effort: Pulmonary effort is normal.     Breath sounds: Normal breath sounds.  Chest:     Chest wall: No tenderness.  Musculoskeletal:        General: Swelling, tenderness, deformity and signs of injury present.     Comments: Deformity noted to the right wrist with moderate edema and ecchymosis.  Right elbow and shoulder are non-tender  Skin:    General: Skin is warm.     Capillary Refill: Capillary refill takes less than 2  seconds.     Findings: No rash.  Neurological:     General: No focal deficit present.     Sensory: No sensory deficit.      ED Treatments / Results  Labs (all labs ordered are listed, but only abnormal results are displayed) Labs Reviewed - No data to display  EKG    Radiology Dg Wrist Complete Right  Result Date: 09/19/2018 CLINICAL DATA:  Fall, deformity EXAM: RIGHT WRIST - COMPLETE 3+ VIEW COMPARISON:  None. FINDINGS: There is a comminuted, angulated and impacted intra-articular colles type fracture of the distal right radius. The carpus remains generally in alignment. The distal right ulna appears intact. Diffuse soft tissue edema about the right wrist. IMPRESSION: There is a comminuted, angulated and impacted intra-articular colles type fracture of the distal right radius. The carpus remains generally in alignment. The distal right ulna appears intact. Diffuse soft tissue edema about the right wrist. Electronically Signed   By: Eddie Candle M.D.   On: 09/19/2018 16:47    Procedures Procedures (including critical care time)  Medications Ordered in ED Medications - No data to display   Initial Impression / Assessment and Plan / ED Course  I have reviewed the triage vital signs and the nursing notes.  Pertinent labs & imaging results that were available during my care of the patient were reviewed by me and considered in my medical decision making (see chart for details).      pt with closed Colles fx distal radius secondary to mechanical fall.  NV intact.   Pt is requesting to see Dr. Aline Brochure.    58  Pt also seen by Dr. Roderic Palau and care plan discussed.  I will also consult Dr. Aline Brochure  1725  Consulted Dr. Aline Brochure and discussed findings, sugar tong splint, sling and he will see in his office.  Pt agrees to call Monday am for appt time.  She states she has pain medication at home.  Database  reviewed  Splint application checked by me, extremity remains NV intact.  Sling  applied  Final Clinical Impressions(s) / ED Diagnoses   Final diagnoses:  Colles' fracture of right radius, init for clos fx    ED Discharge Orders    None       Bufford Lope 09/19/18 1837    Milton Ferguson, MD 09/22/18 1258

## 2018-09-19 NOTE — ED Notes (Signed)
TT in to discuss findings

## 2018-09-19 NOTE — ED Notes (Signed)
Sugar tong application with sling

## 2018-09-21 ENCOUNTER — Other Ambulatory Visit: Payer: Self-pay

## 2018-09-21 ENCOUNTER — Ambulatory Visit (INDEPENDENT_AMBULATORY_CARE_PROVIDER_SITE_OTHER): Payer: Medicare Other | Admitting: Orthopedic Surgery

## 2018-09-21 ENCOUNTER — Encounter: Payer: Self-pay | Admitting: Orthopedic Surgery

## 2018-09-21 VITALS — BP 151/74 | HR 79 | Temp 96.8°F | Ht <= 58 in | Wt 124.0 lb

## 2018-09-21 DIAGNOSIS — M7661 Achilles tendinitis, right leg: Secondary | ICD-10-CM | POA: Diagnosis not present

## 2018-09-21 DIAGNOSIS — S52531A Colles' fracture of right radius, initial encounter for closed fracture: Secondary | ICD-10-CM | POA: Diagnosis not present

## 2018-09-21 NOTE — Progress Notes (Addendum)
NEW PROBLEM OFFICE VISIT  Chief Complaint  Patient presents with  . Wrist Injury    right wrist injury 09/19/18    68 year old female who fractured her left wrist treated with open reduction internal fixation for 3 part intra-articular fracture with Dr. Fredna Dow presents now with a 2-day history of pain in the right wrist status post fall on June 27 with fracture now on the right side which is intra-articular has articular surface depression it is a three-part fracture.  She complains of dull aching pain right wrist joint for 2days slight improvement with splinting   Review of Systems  Constitutional: Positive for diaphoresis.  HENT: Positive for hearing loss.   Respiratory: Positive for cough and shortness of breath.   Musculoskeletal: Positive for falls and joint pain.       Pain right heel  All other systems reviewed and are negative.    Past Medical History:  Diagnosis Date  . Arthritis   . COPD (chronic obstructive pulmonary disease) ([place])   . Depression with anxiety 02/02/2018  . Diabetes mellitus   . High cholesterol   . Hypertension   . Mental disorder     Past Surgical History:  Procedure Laterality Date  . CHOLECYSTECTOMY    . KNEE SURGERY    . LEFT HEART CATH AND CORONARY ANGIOGRAPHY N/A 02/05/2018   Procedure: LEFT HEART CATH AND CORONARY ANGIOGRAPHY;  Surgeon: Burnell Blanks, MD;  Location: [place] [facility];  Service: Cardiovascular;  Laterality: N/A;  . MULTIPLE EXTRACTIONS WITH ALVEOLOPLASTY  06/10/2011   Procedure: MULTIPLE EXTRACION WITH ALVEOLOPLASTY;  Surgeon: Gae Bon, DDS;  Location: [place];  Service: Oral Surgery;  Laterality: Bilateral;  Extractions of one,six,eight,nine,eleven,twenty,twenty-one,twenty-two,twenty-four,twenty-five,twenty-six,twenty-seven,twenty-eight,twenty-nine  . OPEN REDUCTION INTERNAL FIXATION (ORIF) DISTAL RADIAL FRACTURE Left 03/12/2018   Procedure: OPEN REDUCTION INTERNAL FIXATION (ORIF) DISTAL RADIAL FRACTURE;   Surgeon: Leanora Cover, MD;  Location: [place];  Service: Orthopedics;  Laterality: Left;  . WRIST FRACTURE SURGERY Left     Family History  Problem Relation Age of Onset  . Diabetes Mother   . Anesthesia problems Neg Hx   . Hypotension Neg Hx   . Malignant hyperthermia Neg Hx   . Pseudochol deficiency Neg Hx    Social History   Tobacco Use  . Smoking status: Former Smoker    Types: Cigarettes  . Smokeless tobacco: Never Used  Substance Use Topics  . Alcohol use: No  . Drug use: No    Allergies  Allergen Reactions  . Fluoxetine Hcl     REACTION: UNKNOWN REACTION  . Methocarbamol Nausea And Vomiting  . Metoclopramide Hcl     REACTION: UNKNOWN REACTION  . Motrin [Ibuprofen] Other (See Comments)    REACTION UNKNOWN  . Rabeprazole Sodium     REACTION: UNKNOWN REACTION    Current Meds  Medication Sig  . albuterol (PROVENTIL HFA;VENTOLIN HFA) 108 (90 Base) MCG/ACT inhaler Inhale 1 puff into the lungs every 6 (six) hours as needed for wheezing or shortness of breath.  Marland Kitchen albuterol (PROVENTIL) (2.5 MG/3ML) 0.083% nebulizer solution Take 3 mLs (2.5 mg total) by nebulization every 6 (six) hours as needed for wheezing or shortness of breath.  Marland Kitchen albuterol-ipratropium (COMBIVENT) 18-103 MCG/ACT inhaler Inhale 2 puffs into the lungs every 6 (six) hours as needed for wheezing.  Marland Kitchen amLODipine (NORVASC) 10 MG tablet Take 1 tablet (10 mg total) by mouth daily.  Marland Kitchen aspirin EC 81 MG EC tablet Take 1 tablet (81 mg total) by mouth daily.  Marland Kitchen  ezetimibe (ZETIA) 10 MG tablet Take 10 mg by mouth daily.   Marland Kitchen FARXIGA 10 MG TABS tablet Take 10 mg by mouth daily.   . fenofibrate 160 MG tablet Take 1 tablet (160 mg total) by mouth daily.  Marland Kitchen labetalol (NORMODYNE) 200 MG tablet Take 200 mg by mouth 2 (two) times daily.  Marland Kitchen lisinopril (PRINIVIL,ZESTRIL) 20 MG tablet Take 20 mg by mouth daily.  . metFORMIN (GLUCOPHAGE) 500 MG tablet Take by mouth 2 (two) times daily with a meal.  . naproxen  (NAPROSYN) 500 MG tablet Take 500 mg by mouth 2 (two) times daily with a meal.   . oxyCODONE (OXY IR/ROXICODONE) 5 MG immediate release tablet Take 5 mg by mouth 3 (three) times daily.  Marland Kitchen PARoxetine (PAXIL) 20 MG tablet Take 20 mg by mouth every morning.   . Vitamin D, Ergocalciferol, (DRISDOL) 1.25 MG (50000 UT) CAPS capsule Take 1 capsule (50,000 Units total) by mouth every 7 (seven) days.    BP (!) 151/74   Pulse 79   Temp (!) 96.8 F (36 C)   Ht 4\' 10"  (1.473 m)   Wt 124 lb (56.2 kg)   BMI 25.92 kg/m   Physical Exam Vitals signs and nursing note reviewed.  Constitutional:      Appearance: Normal appearance.  Neurological:     Mental Status: She is alert and oriented to person, place, and time.  Psychiatric:        Mood and Affect: Mood normal.     Ortho Exam  Left wrist there is a volar incision overall good alignment.  She complains of ulnar prominence. Range of motion seems reasonable Wrist seems stable No tenderness or swelling Motor exam normal Neurovascular exam intact  Right wrist there is tenderness over the distal radius there is a lot of swelling skin is bruised and ecchymotic but intact motor exams seems to have normal function in all tendons stability test deferred because of pain neurovascular exam is intact   MEDICAL DECISION SECTION  Xrays were done at [facility] in [place]  My independent reading of xrays:  Intra-articular fracture right distal radius 3 part fracture with articular surface depression  Encounter Diagnosis  Name Primary?  . Closed Colles' fracture of right radius, initial encounter Yes    PLAN: (Rx., injectx, surgery, frx, mri/ct) Application of volar splint after skin was checked  The fracture is intra-articular and involves three-part there is probable need for bone graft as was done on the other side  Referral made for hand consultation for open reduction internal fixation and grafting  No orders of the defined  types were placed in this encounter.  Addendum right foot exam Tenderness on the posterior aspect of the helix consistent with Achilles tendinitis some pain with dorsiflexion but overall range of motion was normal foot was stable arch looked fine neurovascular exam is intact  Recommend ice and naproxen  She can follow-up with [place] for continued symptoms after her wrist surgery  Arther Abbott, MD  09/21/2018 1:56 PM

## 2018-09-21 NOTE — Patient Instructions (Addendum)
Right hand: fracture see Dr Fredna Dow   Wrist Fracture Treated With ORIF A wrist fracture is a break or crack in one of the bones of the wrist. The wrist is made up of eight small bones at the palm of the hand (carpal bones) and two long bones that make up the forearm (radius and ulna). If the fracture is displaced, it means that one or more parts of a bone have been moved out of their normal position and the bones are not lined up correctly. Open reduction with internal fixation (ORIF) is a surgical procedure that is used to treat a displaced wrist fracture. In this procedure, a surgeon will put the bone pieces back together. The surgeon will put in plates, screws, or other types of hardware to hold the bones in place. The procedure helps the bones heal properly. Tell a health care provider about:  Any allergies you have.  All medicines you are taking, including vitamins, herbs, eye drops, creams, and over-the-counter medicines.  Any problems you or family members have had with anesthetic medicines.  Any blood disorders you have.  Any surgeries you have had.  Any medical conditions you have.  Whether you are pregnant or may be pregnant. What are the risks? Generally, this is a safe procedure. However, problems may occur, including:  Infection.  Bleeding.  Failure of the bone to heal.  Wrist stiffness.  Damage to other structures.  Allergic reactions to medicines. What happens before the procedure? Staying hydrated Follow instructions from your health care provider about hydration, which may include:  Up to 2 hours before the procedure - you may continue to drink clear liquids, such as water, clear fruit juice, black coffee, and plain tea.  Eating and drinking restrictions Follow instructions from your health care provider about eating and drinking, which may include:  8 hours before the procedure - stop eating heavy meals or foods, such as meat, fried foods, or fatty  foods.  6 hours before the procedure - stop eating light meals or foods, such as toast or cereal.  6 hours before the procedure - stop drinking milk or drinks that contain milk.  2 hours before the procedure - stop drinking clear liquids. Medicines Ask your health care provider about:  Changing or stopping your regular medicines. This is especially important if you are taking diabetes medicines or blood thinners.  Taking medicines such as aspirin and ibuprofen. These medicines can thin your blood. Do not take these medicines unless your health care provider tells you to take them.  Taking over-the-counter medicines, vitamins, herbs, and supplements. General instructions  Plan to have someone take you home from the hospital or clinic.  Plan to have a responsible adult care for you for at least 24 hours after you leave the hospital or clinic. This is important.  Ask your health care provider what steps will be taken to help prevent infection. These may include: ? Removing hair at the surgery site. ? Washing skin with a germ-killing soap. ? Taking antibiotic medicine. What happens during the procedure?   An IV will be inserted into one of your veins.  You will be given one or more of the following: ? A medicine to help you relax (sedative). ? A medicine to numb the area (local anesthetic). ? A medicine to make you fall asleep (general anesthetic). ? A medicine that is injected into an area of your body to numb everything below the injection site (regional anesthetic).  The surgeon will  make an incision through your skin over the area of the fracture.  The broken bones will be returned to their normal positions. To hold the bones in place, the surgeon will use screws and a metal plate or different types of wiring.  The surgeon will close the incision with stitches (sutures) or staples.  A bandage (dressing) will be placed over your incision.  A splint or cast may be placed  over your dressing. The procedure may vary among health care providers and hospitals. What happens after the procedure?  Your blood pressure, heart rate, breathing rate, and blood oxygen level will be monitored until you leave the hospital or clinic.  You will be given medicine as needed for pain.  Your wrist will be placed on pillows to keep it raised (elevated). This will help prevent swelling.  You may have physical therapy while you are in the hospital.  You may be sent home with a sling to support your arm.  Do not drive until your health care provider approves. Summary  If a wrist fracture is displaced, it means that one or more parts of a bone have been moved out of their normal position and the bones are not lined up correctly.  A displaced wrist fracture will be treated with a surgical procedure that is called open reduction with internal fixation (ORIF).  The bone pieces are put back together and held in place by plates, screws, or other types of hardware.  Follow instructions from your health care provider about eating and drinking before the procedure. This information is not intended to replace advice given to you by your health care provider. Make sure you discuss any questions you have with your health care provider. Document Released: 02/20/2015 Document Revised: 07/29/2017 Document Reviewed: 07/29/2017 Elsevier Patient Education  [place] : Achilles tendonitis  -use ice  -take naprosyn      Achilles Tendinitis  Achilles tendinitis is inflammation of the tough, cord-like band that attaches the lower leg muscles to the heel bone (Achilles tendon). This is usually caused by overusing the tendon and the ankle joint. Achilles tendinitis usually gets better over time with treatment and caring for yourself at home. It can take weeks or months to heal completely. What are the causes? This condition may be caused by:  A sudden increase in  exercise or activity, such as running.  Doing the same exercises or activities (such as jumping) over and over.  Not warming up calf muscles before exercising.  Exercising in shoes that are worn out or not made for exercise.  Having arthritis or a bone growth (spur) on the back of the heel bone. This can rub against the tendon and hurt it.  Age-related wear and tear. Tendons become less flexible with age and more likely to be injured. What are the signs or symptoms? Common symptoms of this condition include:  Pain in the Achilles tendon or in the back of the leg, just above the heel. The pain usually gets worse with exercise.  Stiffness or soreness in the back of the leg, especially in the morning.  Swelling of the skin over the Achilles tendon.  Thickening of the tendon.  Bone spurs at the bottom of the Achilles tendon, near the heel.  Trouble standing on tiptoe. How is this diagnosed? This condition is diagnosed based on your symptoms and a physical exam. You may have tests, including:  X-rays.  MRI. How is this treated? The  goal of treatment is to relieve symptoms and help your injury heal. Treatment may include:  Decreasing or stopping activities that caused the tendinitis. This may mean switching to low-impact exercises like biking or swimming.  Icing the injured area.  Doing physical therapy, including strengthening and stretching exercises.  NSAIDs to help relieve pain and swelling.  Using supportive shoes, wraps, heel lifts, or a walking boot (air cast).  Surgery. This may be done if your symptoms do not improve after 6 months.  Using high-energy shock wave impulses to stimulate the healing process (extracorporeal shock wave therapy). This is rare.  Injection of medicines to help relieve inflammation (corticosteroids). This is rare. Follow these instructions at home: If you have an air cast:  Wear the cast as told by your health care provider. Remove it  only as told by your health care provider.  Loosen the cast if your toes tingle, become numb, or turn cold and blue. Activity  Gradually return to your normal activities once your health care provider approves. Do not do activities that cause pain. ? Consider doing low-impact exercises, like cycling or swimming.  If you have an air cast, ask your health care provider when it is safe for you to drive.  If physical therapy was prescribed, do exercises as told by your health care provider or physical therapist. Managing pain, stiffness, and swelling   Raise (elevate) your foot above the level of your heart while you are sitting or lying down.  Move your toes often to avoid stiffness and to lessen swelling.  If directed, put ice on the injured area: ? Put ice in a plastic bag. ? Place a towel between your skin and the bag. ? Leave the ice on for 20 minutes, 2-3 times a day General instructions  If directed, wrap your foot with an elastic bandage or other wrap. This can help keep your tendon from moving too much while it heals. Your health care provider will show you how to wrap your foot correctly.  Wear supportive shoes or heel lifts only as told by your health care provider.  Take over-the-counter and prescription medicines only as told by your health care provider.  Keep all follow-up visits as told by your health care provider. This is important. Contact a health care provider if:  You have symptoms that gets worse.  You have pain that does not get better with medicine.  You develop new, unexplained symptoms.  You develop warmth and swelling in your foot.  You have a fever. Get help right away if:  You have a sudden popping sound or sensation in your Achilles tendon followed by severe pain.  You cannot move your toes or foot.  You cannot put any weight on your foot. Summary  Achilles tendinitis is inflammation of the tough, cord-like band that attaches the lower leg  muscles to the heel bone (Achilles tendon).  This condition is usually caused by overusing the tendon and the ankle joint. It can also be caused by arthritis or normal aging.  The most common symptoms of this condition include pain, swelling, or stiffness in the Achilles tendon or in the back of the leg.  This condition is usually treated with rest, NSAIDs, and physical therapy. This information is not intended to replace advice given to you by your health care provider. Make sure you discuss any questions you have with your health care provider. Document Released: 12/19/2004 Document Revised: 02/21/2017 Document Reviewed: 01/29/2016 Elsevier Patient Education  2020  [employer].

## 2018-10-13 ENCOUNTER — Telehealth: Payer: Self-pay | Admitting: Radiology

## 2018-10-13 NOTE — Telephone Encounter (Signed)
Patient called She did not see Dr. Fredna Dow / Shirlean Mylar called her for appointment scheduling and patient did not call her back.   I have told her to call Dr Fredna Dow, he did surgery on her other wrist.   She states she "will try" she has the number  She is still in the volar splint we placed her in on 09/21/18 she wants to know if you will see her to take it off. I told you you wanted her to have surgery by Dr Fredna Dow, you do not do the surgery she needs.   I do not feel as though she will call his office, will you see her ?

## 2018-10-14 NOTE — Telephone Encounter (Signed)
She has been advised, thank you.

## 2018-10-14 NOTE — Telephone Encounter (Signed)
She has to see him   Keep splint on

## 2018-10-29 ENCOUNTER — Encounter (HOSPITAL_COMMUNITY): Payer: Self-pay

## 2018-10-29 ENCOUNTER — Other Ambulatory Visit: Payer: Self-pay

## 2018-10-29 ENCOUNTER — Inpatient Hospital Stay (HOSPITAL_COMMUNITY): Payer: Medicare Other

## 2018-10-29 ENCOUNTER — Inpatient Hospital Stay (HOSPITAL_COMMUNITY)
Admission: EM | Admit: 2018-10-29 | Discharge: 2018-11-24 | DRG: 870 | Disposition: E | Payer: Medicare Other | Attending: Internal Medicine | Admitting: Internal Medicine

## 2018-10-29 ENCOUNTER — Emergency Department (HOSPITAL_COMMUNITY): Payer: Medicare Other

## 2018-10-29 DIAGNOSIS — E872 Acidosis: Secondary | ICD-10-CM | POA: Diagnosis present

## 2018-10-29 DIAGNOSIS — R531 Weakness: Secondary | ICD-10-CM

## 2018-10-29 DIAGNOSIS — R402342 Coma scale, best motor response, flexion withdrawal, at arrival to emergency department: Secondary | ICD-10-CM | POA: Diagnosis present

## 2018-10-29 DIAGNOSIS — J440 Chronic obstructive pulmonary disease with acute lower respiratory infection: Secondary | ICD-10-CM | POA: Diagnosis present

## 2018-10-29 DIAGNOSIS — Z978 Presence of other specified devices: Secondary | ICD-10-CM

## 2018-10-29 DIAGNOSIS — E875 Hyperkalemia: Secondary | ICD-10-CM | POA: Diagnosis not present

## 2018-10-29 DIAGNOSIS — N183 Chronic kidney disease, stage 3 (moderate): Secondary | ICD-10-CM | POA: Diagnosis present

## 2018-10-29 DIAGNOSIS — G92 Toxic encephalopathy: Secondary | ICD-10-CM | POA: Diagnosis present

## 2018-10-29 DIAGNOSIS — J9690 Respiratory failure, unspecified, unspecified whether with hypoxia or hypercapnia: Secondary | ICD-10-CM

## 2018-10-29 DIAGNOSIS — E440 Moderate protein-calorie malnutrition: Secondary | ICD-10-CM | POA: Diagnosis present

## 2018-10-29 DIAGNOSIS — J90 Pleural effusion, not elsewhere classified: Secondary | ICD-10-CM | POA: Diagnosis present

## 2018-10-29 DIAGNOSIS — R7881 Bacteremia: Secondary | ICD-10-CM

## 2018-10-29 DIAGNOSIS — A4159 Other Gram-negative sepsis: Secondary | ICD-10-CM | POA: Diagnosis present

## 2018-10-29 DIAGNOSIS — R402212 Coma scale, best verbal response, none, at arrival to emergency department: Secondary | ICD-10-CM | POA: Diagnosis present

## 2018-10-29 DIAGNOSIS — I5043 Acute on chronic combined systolic (congestive) and diastolic (congestive) heart failure: Secondary | ICD-10-CM | POA: Diagnosis present

## 2018-10-29 DIAGNOSIS — N171 Acute kidney failure with acute cortical necrosis: Secondary | ICD-10-CM | POA: Diagnosis not present

## 2018-10-29 DIAGNOSIS — J150 Pneumonia due to Klebsiella pneumoniae: Secondary | ICD-10-CM | POA: Diagnosis present

## 2018-10-29 DIAGNOSIS — Z20828 Contact with and (suspected) exposure to other viral communicable diseases: Secondary | ICD-10-CM | POA: Diagnosis present

## 2018-10-29 DIAGNOSIS — N189 Chronic kidney disease, unspecified: Secondary | ICD-10-CM | POA: Diagnosis not present

## 2018-10-29 DIAGNOSIS — Z833 Family history of diabetes mellitus: Secondary | ICD-10-CM

## 2018-10-29 DIAGNOSIS — N179 Acute kidney failure, unspecified: Secondary | ICD-10-CM | POA: Diagnosis not present

## 2018-10-29 DIAGNOSIS — L89629 Pressure ulcer of left heel, unspecified stage: Secondary | ICD-10-CM | POA: Diagnosis not present

## 2018-10-29 DIAGNOSIS — Z888 Allergy status to other drugs, medicaments and biological substances status: Secondary | ICD-10-CM

## 2018-10-29 DIAGNOSIS — E1165 Type 2 diabetes mellitus with hyperglycemia: Secondary | ICD-10-CM | POA: Diagnosis present

## 2018-10-29 DIAGNOSIS — E1122 Type 2 diabetes mellitus with diabetic chronic kidney disease: Secondary | ICD-10-CM | POA: Diagnosis not present

## 2018-10-29 DIAGNOSIS — A419 Sepsis, unspecified organism: Secondary | ICD-10-CM | POA: Diagnosis not present

## 2018-10-29 DIAGNOSIS — J9601 Acute respiratory failure with hypoxia: Secondary | ICD-10-CM | POA: Diagnosis not present

## 2018-10-29 DIAGNOSIS — Z9911 Dependence on respirator [ventilator] status: Secondary | ICD-10-CM

## 2018-10-29 DIAGNOSIS — Z79891 Long term (current) use of opiate analgesic: Secondary | ICD-10-CM

## 2018-10-29 DIAGNOSIS — L89309 Pressure ulcer of unspecified buttock, unspecified stage: Secondary | ICD-10-CM | POA: Diagnosis not present

## 2018-10-29 DIAGNOSIS — I2510 Atherosclerotic heart disease of native coronary artery without angina pectoris: Secondary | ICD-10-CM | POA: Diagnosis present

## 2018-10-29 DIAGNOSIS — R6521 Severe sepsis with septic shock: Secondary | ICD-10-CM | POA: Diagnosis present

## 2018-10-29 DIAGNOSIS — G931 Anoxic brain damage, not elsewhere classified: Secondary | ICD-10-CM | POA: Diagnosis present

## 2018-10-29 DIAGNOSIS — E860 Dehydration: Secondary | ICD-10-CM | POA: Diagnosis present

## 2018-10-29 DIAGNOSIS — R402142 Coma scale, eyes open, spontaneous, at arrival to emergency department: Secondary | ICD-10-CM | POA: Diagnosis present

## 2018-10-29 DIAGNOSIS — K1120 Sialoadenitis, unspecified: Secondary | ICD-10-CM | POA: Diagnosis present

## 2018-10-29 DIAGNOSIS — Z87891 Personal history of nicotine dependence: Secondary | ICD-10-CM

## 2018-10-29 DIAGNOSIS — E7800 Pure hypercholesterolemia, unspecified: Secondary | ICD-10-CM | POA: Diagnosis present

## 2018-10-29 DIAGNOSIS — Z452 Encounter for adjustment and management of vascular access device: Secondary | ICD-10-CM

## 2018-10-29 DIAGNOSIS — G894 Chronic pain syndrome: Secondary | ICD-10-CM | POA: Diagnosis present

## 2018-10-29 DIAGNOSIS — R58 Hemorrhage, not elsewhere classified: Secondary | ICD-10-CM | POA: Diagnosis not present

## 2018-10-29 DIAGNOSIS — L89326 Pressure-induced deep tissue damage of left buttock: Secondary | ICD-10-CM | POA: Diagnosis present

## 2018-10-29 DIAGNOSIS — E871 Hypo-osmolality and hyponatremia: Secondary | ICD-10-CM | POA: Diagnosis not present

## 2018-10-29 DIAGNOSIS — N170 Acute kidney failure with tubular necrosis: Secondary | ICD-10-CM | POA: Diagnosis present

## 2018-10-29 DIAGNOSIS — I6389 Other cerebral infarction: Secondary | ICD-10-CM | POA: Diagnosis present

## 2018-10-29 DIAGNOSIS — W19XXXA Unspecified fall, initial encounter: Secondary | ICD-10-CM

## 2018-10-29 DIAGNOSIS — Z66 Do not resuscitate: Secondary | ICD-10-CM | POA: Diagnosis present

## 2018-10-29 DIAGNOSIS — E785 Hyperlipidemia, unspecified: Secondary | ICD-10-CM | POA: Diagnosis present

## 2018-10-29 DIAGNOSIS — J9600 Acute respiratory failure, unspecified whether with hypoxia or hypercapnia: Secondary | ICD-10-CM

## 2018-10-29 DIAGNOSIS — B9562 Methicillin resistant Staphylococcus aureus infection as the cause of diseases classified elsewhere: Secondary | ICD-10-CM

## 2018-10-29 DIAGNOSIS — Y92002 Bathroom of unspecified non-institutional (private) residence single-family (private) house as the place of occurrence of the external cause: Secondary | ICD-10-CM

## 2018-10-29 DIAGNOSIS — D631 Anemia in chronic kidney disease: Secondary | ICD-10-CM | POA: Diagnosis present

## 2018-10-29 DIAGNOSIS — R680 Hypothermia, not associated with low environmental temperature: Secondary | ICD-10-CM | POA: Diagnosis not present

## 2018-10-29 DIAGNOSIS — E119 Type 2 diabetes mellitus without complications: Secondary | ICD-10-CM

## 2018-10-29 DIAGNOSIS — J869 Pyothorax without fistula: Secondary | ICD-10-CM

## 2018-10-29 DIAGNOSIS — L89626 Pressure-induced deep tissue damage of left heel: Secondary | ICD-10-CM | POA: Diagnosis present

## 2018-10-29 DIAGNOSIS — R34 Anuria and oliguria: Secondary | ICD-10-CM | POA: Diagnosis present

## 2018-10-29 DIAGNOSIS — I38 Endocarditis, valve unspecified: Secondary | ICD-10-CM | POA: Diagnosis not present

## 2018-10-29 DIAGNOSIS — L89316 Pressure-induced deep tissue damage of right buttock: Secondary | ICD-10-CM | POA: Diagnosis present

## 2018-10-29 DIAGNOSIS — Z515 Encounter for palliative care: Secondary | ICD-10-CM | POA: Diagnosis present

## 2018-10-29 DIAGNOSIS — R296 Repeated falls: Secondary | ICD-10-CM | POA: Diagnosis present

## 2018-10-29 DIAGNOSIS — Z7982 Long term (current) use of aspirin: Secondary | ICD-10-CM

## 2018-10-29 DIAGNOSIS — G9340 Encephalopathy, unspecified: Secondary | ICD-10-CM | POA: Diagnosis present

## 2018-10-29 DIAGNOSIS — I130 Hypertensive heart and chronic kidney disease with heart failure and stage 1 through stage 4 chronic kidney disease, or unspecified chronic kidney disease: Secondary | ICD-10-CM | POA: Diagnosis present

## 2018-10-29 DIAGNOSIS — I509 Heart failure, unspecified: Secondary | ICD-10-CM

## 2018-10-29 DIAGNOSIS — B961 Klebsiella pneumoniae [K. pneumoniae] as the cause of diseases classified elsewhere: Secondary | ICD-10-CM | POA: Diagnosis not present

## 2018-10-29 DIAGNOSIS — D696 Thrombocytopenia, unspecified: Secondary | ICD-10-CM | POA: Diagnosis not present

## 2018-10-29 DIAGNOSIS — L89616 Pressure-induced deep tissue damage of right heel: Secondary | ICD-10-CM | POA: Diagnosis present

## 2018-10-29 DIAGNOSIS — Z992 Dependence on renal dialysis: Secondary | ICD-10-CM | POA: Diagnosis not present

## 2018-10-29 DIAGNOSIS — Z79899 Other long term (current) drug therapy: Secondary | ICD-10-CM

## 2018-10-29 DIAGNOSIS — R0602 Shortness of breath: Secondary | ICD-10-CM

## 2018-10-29 DIAGNOSIS — I4891 Unspecified atrial fibrillation: Secondary | ICD-10-CM | POA: Diagnosis not present

## 2018-10-29 DIAGNOSIS — S52501G Unspecified fracture of the lower end of right radius, subsequent encounter for closed fracture with delayed healing: Secondary | ICD-10-CM

## 2018-10-29 DIAGNOSIS — L8990 Pressure ulcer of unspecified site, unspecified stage: Secondary | ICD-10-CM | POA: Insufficient documentation

## 2018-10-29 DIAGNOSIS — Z95828 Presence of other vascular implants and grafts: Secondary | ICD-10-CM | POA: Diagnosis not present

## 2018-10-29 DIAGNOSIS — L89619 Pressure ulcer of right heel, unspecified stage: Secondary | ICD-10-CM | POA: Diagnosis not present

## 2018-10-29 DIAGNOSIS — F418 Other specified anxiety disorders: Secondary | ICD-10-CM | POA: Diagnosis present

## 2018-10-29 DIAGNOSIS — Z7984 Long term (current) use of oral hypoglycemic drugs: Secondary | ICD-10-CM

## 2018-10-29 DIAGNOSIS — Z6828 Body mass index (BMI) 28.0-28.9, adult: Secondary | ICD-10-CM

## 2018-10-29 DIAGNOSIS — R627 Adult failure to thrive: Secondary | ICD-10-CM | POA: Diagnosis present

## 2018-10-29 DIAGNOSIS — E8729 Other acidosis: Secondary | ICD-10-CM

## 2018-10-29 LAB — BASIC METABOLIC PANEL
Anion gap: 14 (ref 5–15)
Anion gap: 14 (ref 5–15)
BUN: 122 mg/dL — ABNORMAL HIGH (ref 8–23)
BUN: 118 mg/dL — ABNORMAL HIGH (ref 8–23)
CO2: 14 mmol/L — ABNORMAL LOW (ref 22–32)
CO2: 18 mmol/L — ABNORMAL LOW (ref 22–32)
Calcium: 8.7 mg/dL — ABNORMAL LOW (ref 8.9–10.3)
Calcium: 7.4 mg/dL — ABNORMAL LOW (ref 8.9–10.3)
Chloride: 100 mmol/L (ref 98–111)
Chloride: 99 mmol/L (ref 98–111)
Creatinine, Ser: 3.25 mg/dL — ABNORMAL HIGH (ref 0.44–1.00)
Creatinine, Ser: 3.08 mg/dL — ABNORMAL HIGH (ref 0.44–1.00)
GFR calc Af Amer: 16 mL/min — ABNORMAL LOW (ref >60)
GFR calc Af Amer: 18 mL/min — ABNORMAL LOW (ref >60)
GFR calc non Af Amer: 14 mL/min — ABNORMAL LOW (ref >60)
GFR calc non Af Amer: 15 mL/min — ABNORMAL LOW (ref >60)
Glucose, Bld: 171 mg/dL — ABNORMAL HIGH (ref 70–99)
Glucose, Bld: 115 mg/dL — ABNORMAL HIGH (ref 70–99)
Potassium: 6.4 mmol/L (ref 3.5–5.1)
Potassium: 5.7 mmol/L — ABNORMAL HIGH (ref 3.5–5.1)
Sodium: 128 mmol/L — ABNORMAL LOW (ref 135–145)
Sodium: 131 mmol/L — ABNORMAL LOW (ref 135–145)

## 2018-10-29 LAB — TROPONIN I (HIGH SENSITIVITY)
Troponin I (High Sensitivity): 12 ng/L (ref <18)
Troponin I (High Sensitivity): 11 ng/L (ref <18)
Troponin I (High Sensitivity): 12 ng/L (ref <18)
Troponin I (High Sensitivity): 13 ng/L (ref <18)

## 2018-10-29 LAB — URINALYSIS, ROUTINE W REFLEX MICROSCOPIC
Bilirubin Urine: NEGATIVE
Glucose, UA: 50 mg/dL — AB
Ketones, ur: NEGATIVE mg/dL
Nitrite: NEGATIVE
Protein, ur: NEGATIVE mg/dL
Specific Gravity, Urine: 1.011 (ref 1.005–1.030)
pH: 5 (ref 5.0–8.0)

## 2018-10-29 LAB — LACTIC ACID, PLASMA
Lactic Acid, Venous: 1.9 mmol/L (ref 0.5–1.9)
Lactic Acid, Venous: 1.9 mmol/L (ref 0.5–1.9)

## 2018-10-29 LAB — COMPREHENSIVE METABOLIC PANEL
ALT: 79 U/L — ABNORMAL HIGH (ref 0–44)
AST: 78 U/L — ABNORMAL HIGH (ref 15–41)
Albumin: 2.2 g/dL — ABNORMAL LOW (ref 3.5–5.0)
Alkaline Phosphatase: 126 U/L (ref 38–126)
Anion gap: 17 — ABNORMAL HIGH (ref 5–15)
BUN: 126 mg/dL — ABNORMAL HIGH (ref 8–23)
CO2: 12 mmol/L — ABNORMAL LOW (ref 22–32)
Calcium: 9 mg/dL (ref 8.9–10.3)
Chloride: 94 mmol/L — ABNORMAL LOW (ref 98–111)
Creatinine, Ser: 3.36 mg/dL — ABNORMAL HIGH (ref 0.44–1.00)
GFR calc Af Amer: 16 mL/min — ABNORMAL LOW (ref >60)
GFR calc non Af Amer: 14 mL/min — ABNORMAL LOW (ref >60)
Glucose, Bld: 227 mg/dL — ABNORMAL HIGH (ref 70–99)
Potassium: 7.4 mmol/L (ref 3.5–5.1)
Sodium: 123 mmol/L — ABNORMAL LOW (ref 135–145)
Total Bilirubin: 3.4 mg/dL — ABNORMAL HIGH (ref 0.3–1.2)
Total Protein: 6.5 g/dL (ref 6.5–8.1)

## 2018-10-29 LAB — RENAL FUNCTION PANEL
Albumin: 2.3 g/dL — ABNORMAL LOW (ref 3.5–5.0)
Albumin: 1.7 g/dL — ABNORMAL LOW (ref 3.5–5.0)
Anion gap: 14 (ref 5–15)
Anion gap: 14 (ref 5–15)
BUN: 132 mg/dL — ABNORMAL HIGH (ref 8–23)
BUN: 120 mg/dL — ABNORMAL HIGH (ref 8–23)
CO2: 12 mmol/L — ABNORMAL LOW (ref 22–32)
CO2: 11 mmol/L — ABNORMAL LOW (ref 22–32)
Calcium: 9 mg/dL (ref 8.9–10.3)
Calcium: 7.9 mg/dL — ABNORMAL LOW (ref 8.9–10.3)
Chloride: 98 mmol/L (ref 98–111)
Chloride: 104 mmol/L (ref 98–111)
Creatinine, Ser: 3.53 mg/dL — ABNORMAL HIGH (ref 0.44–1.00)
Creatinine, Ser: 3.14 mg/dL — ABNORMAL HIGH (ref 0.44–1.00)
GFR calc Af Amer: 15 mL/min — ABNORMAL LOW (ref >60)
GFR calc Af Amer: 17 mL/min — ABNORMAL LOW (ref >60)
GFR calc non Af Amer: 13 mL/min — ABNORMAL LOW (ref >60)
GFR calc non Af Amer: 15 mL/min — ABNORMAL LOW (ref >60)
Glucose, Bld: 220 mg/dL — ABNORMAL HIGH (ref 70–99)
Glucose, Bld: 120 mg/dL — ABNORMAL HIGH (ref 70–99)
Phosphorus: 6.2 mg/dL — ABNORMAL HIGH (ref 2.5–4.6)
Phosphorus: 6 mg/dL — ABNORMAL HIGH (ref 2.5–4.6)
Potassium: 7.5 mmol/L (ref 3.5–5.1)
Potassium: 6.9 mmol/L (ref 3.5–5.1)
Sodium: 124 mmol/L — ABNORMAL LOW (ref 135–145)
Sodium: 129 mmol/L — ABNORMAL LOW (ref 135–145)

## 2018-10-29 LAB — HEPATIC FUNCTION PANEL
ALT: 65 U/L — ABNORMAL HIGH (ref 0–44)
AST: 100 U/L — ABNORMAL HIGH (ref 15–41)
Albumin: 1.3 g/dL — ABNORMAL LOW (ref 3.5–5.0)
Alkaline Phosphatase: 108 U/L (ref 38–126)
Bilirubin, Direct: 1.3 mg/dL — ABNORMAL HIGH (ref 0.0–0.2)
Indirect Bilirubin: 1.1 mg/dL — ABNORMAL HIGH (ref 0.3–0.9)
Total Bilirubin: 2.4 mg/dL — ABNORMAL HIGH (ref 0.3–1.2)
Total Protein: 4.3 g/dL — ABNORMAL LOW (ref 6.5–8.1)

## 2018-10-29 LAB — CBC
HCT: 22.4 % — ABNORMAL LOW (ref 36.0–46.0)
Hemoglobin: 7.9 g/dL — ABNORMAL LOW (ref 12.0–15.0)
MCH: 28.6 pg (ref 26.0–34.0)
MCHC: 35.3 g/dL (ref 30.0–36.0)
MCV: 81.2 fL (ref 80.0–100.0)
Platelets: 144 10*3/uL — ABNORMAL LOW (ref 150–400)
RBC: 2.76 MIL/uL — ABNORMAL LOW (ref 3.87–5.11)
RDW: 16.1 % — ABNORMAL HIGH (ref 11.5–15.5)
WBC: 2.9 10*3/uL — ABNORMAL LOW (ref 4.0–10.5)
nRBC: 1 % — ABNORMAL HIGH (ref 0.0–0.2)

## 2018-10-29 LAB — CREATININE, URINE, RANDOM: Creatinine, Urine: 43.22 mg/dL

## 2018-10-29 LAB — CBC WITH DIFFERENTIAL/PLATELET
Abs Immature Granulocytes: 0.03 10*3/uL (ref 0.00–0.07)
Basophils Absolute: 0 10*3/uL (ref 0.0–0.1)
Basophils Relative: 0 %
Eosinophils Absolute: 0.1 10*3/uL (ref 0.0–0.5)
Eosinophils Relative: 1 %
HCT: 29.6 % — ABNORMAL LOW (ref 36.0–46.0)
Hemoglobin: 9.7 g/dL — ABNORMAL LOW (ref 12.0–15.0)
Immature Granulocytes: 0 %
Lymphocytes Relative: 2 %
Lymphs Abs: 0.2 10*3/uL — ABNORMAL LOW (ref 0.7–4.0)
MCH: 28.2 pg (ref 26.0–34.0)
MCHC: 32.8 g/dL (ref 30.0–36.0)
MCV: 86 fL (ref 80.0–100.0)
Monocytes Absolute: 0.3 10*3/uL (ref 0.1–1.0)
Monocytes Relative: 3 %
Neutro Abs: 8.5 10*3/uL — ABNORMAL HIGH (ref 1.7–7.7)
Neutrophils Relative %: 94 %
Platelets: 238 10*3/uL (ref 150–400)
RBC: 3.44 MIL/uL — ABNORMAL LOW (ref 3.87–5.11)
RDW: 16.3 % — ABNORMAL HIGH (ref 11.5–15.5)
WBC: 9.2 10*3/uL (ref 4.0–10.5)
nRBC: 0.4 % — ABNORMAL HIGH (ref 0.0–0.2)

## 2018-10-29 LAB — I-STAT CHEM 8, ED
BUN: 123 mg/dL — ABNORMAL HIGH (ref 8–23)
Calcium, Ion: 1.17 mmol/L (ref 1.15–1.40)
Chloride: 98 mmol/L (ref 98–111)
Creatinine, Ser: 3.8 mg/dL — ABNORMAL HIGH (ref 0.44–1.00)
Glucose, Bld: 220 mg/dL — ABNORMAL HIGH (ref 70–99)
HCT: 32 % — ABNORMAL LOW (ref 36.0–46.0)
Hemoglobin: 10.9 g/dL — ABNORMAL LOW (ref 12.0–15.0)
Potassium: 7.2 mmol/L (ref 3.5–5.1)
Sodium: 122 mmol/L — ABNORMAL LOW (ref 135–145)
TCO2: 16 mmol/L — ABNORMAL LOW (ref 22–32)

## 2018-10-29 LAB — HEMOGLOBIN A1C
Hgb A1c MFr Bld: 8.8 % — ABNORMAL HIGH (ref 4.8–5.6)
Mean Plasma Glucose: 205.86 mg/dL

## 2018-10-29 LAB — CK TOTAL AND CKMB (NOT AT ARMC)
CK, MB: 15.2 ng/mL — ABNORMAL HIGH (ref 0.5–5.0)
Relative Index: 3.6 — ABNORMAL HIGH (ref 0.0–2.5)
Total CK: 421 U/L — ABNORMAL HIGH (ref 38–234)

## 2018-10-29 LAB — PROTIME-INR
INR: 1.4 — ABNORMAL HIGH (ref 0.8–1.2)
Prothrombin Time: 17.3 seconds — ABNORMAL HIGH (ref 11.4–15.2)

## 2018-10-29 LAB — BETA-HYDROXYBUTYRIC ACID: Beta-Hydroxybutyric Acid: 0.99 mmol/L — ABNORMAL HIGH (ref 0.05–0.27)

## 2018-10-29 LAB — PHOSPHORUS: Phosphorus: 5.9 mg/dL — ABNORMAL HIGH (ref 2.5–4.6)

## 2018-10-29 LAB — MRSA PCR SCREENING: MRSA by PCR: POSITIVE — AB

## 2018-10-29 LAB — CK: Total CK: 665 U/L — ABNORMAL HIGH (ref 38–234)

## 2018-10-29 LAB — BRAIN NATRIURETIC PEPTIDE: B Natriuretic Peptide: 730 pg/mL — ABNORMAL HIGH (ref 0.0–100.0)

## 2018-10-29 LAB — AMMONIA: Ammonia: 16 umol/L (ref 9–35)

## 2018-10-29 LAB — MAGNESIUM
Magnesium: 1.7 mg/dL (ref 1.7–2.4)
Magnesium: 1.4 mg/dL — ABNORMAL LOW (ref 1.7–2.4)

## 2018-10-29 LAB — GLUCOSE, CAPILLARY
Glucose-Capillary: 186 mg/dL — ABNORMAL HIGH (ref 70–99)
Glucose-Capillary: 122 mg/dL — ABNORMAL HIGH (ref 70–99)

## 2018-10-29 LAB — SODIUM, URINE, RANDOM: Sodium, Ur: 34 mmol/L

## 2018-10-29 LAB — SARS CORONAVIRUS 2 BY RT PCR (HOSPITAL ORDER, PERFORMED IN ~~LOC~~ HOSPITAL LAB): SARS Coronavirus 2: NEGATIVE

## 2018-10-29 MED ORDER — ROSUVASTATIN CALCIUM 5 MG PO TABS: 10.0000 mg | ORAL_TABLET | Freq: Every day | ORAL | Status: DC

## 2018-10-29 MED ORDER — SODIUM CHLORIDE 0.9 % IV BOLUS
1000.0000 mL | Freq: Once | INTRAVENOUS | Status: AC
  Administered 2018-10-29: 1000 mL via INTRAVENOUS

## 2018-10-29 MED ORDER — FAMOTIDINE IN NACL 20-0.9 MG/50ML-% IV SOLN
20.0000 mg | INTRAVENOUS | Status: DC
  Administered 2018-10-30 – 2018-10-31 (×2): 20 mg via INTRAVENOUS
  Filled 2018-10-29 (×2): qty 50

## 2018-10-29 MED ORDER — SODIUM POLYSTYRENE SULFONATE 15 GM/60ML PO SUSP
60.0000 g | Freq: Once | ORAL | Status: AC
  Administered 2018-10-29: 18:00:00 60 g via RECTAL
  Filled 2018-10-29: qty 240

## 2018-10-29 MED ORDER — CALCIUM GLUCONATE 10 % IV SOLN
1.0000 g | Freq: Once | INTRAVENOUS | Status: AC
  Administered 2018-10-29: 1 g via INTRAVENOUS
  Filled 2018-10-29: qty 10

## 2018-10-29 MED ORDER — ALBUTEROL SULFATE (2.5 MG/3ML) 0.083% IN NEBU: 10.0000 mg | INHALATION_SOLUTION | Freq: Once | RESPIRATORY_TRACT | Status: DC

## 2018-10-29 MED ORDER — SODIUM CHLORIDE 0.9 % IV SOLN
250.0000 mL | INTRAVENOUS | Status: DC | PRN
  Administered 2018-10-29: 250 mL via INTRAVENOUS
  Administered 2018-10-30: 500 mL via INTRAVENOUS
  Administered 2018-11-01: 250 mL via INTRAVENOUS

## 2018-10-29 MED ORDER — MUPIROCIN 2 % EX OINT
1.0000 "application " | TOPICAL_OINTMENT | Freq: Two times a day (BID) | CUTANEOUS | Status: AC
  Administered 2018-10-29 – 2018-11-03 (×10): 1 via NASAL
  Filled 2018-10-29 (×3): qty 22

## 2018-10-29 MED ORDER — SODIUM CHLORIDE 0.9% FLUSH
3.0000 mL | Freq: Two times a day (BID) | INTRAVENOUS | Status: DC
  Administered 2018-10-29 – 2018-10-31 (×5): 3 mL via INTRAVENOUS
  Administered 2018-11-01: 22:00:00 10 mL via INTRAVENOUS
  Administered 2018-11-01 – 2018-11-02 (×2): 3 mL via INTRAVENOUS
  Administered 2018-11-02: 21:00:00 10 mL via INTRAVENOUS
  Administered 2018-11-03: 3 mL via INTRAVENOUS

## 2018-10-29 MED ORDER — ONDANSETRON HCL 4 MG PO TABS: 4.0000 mg | ORAL_TABLET | Freq: Four times a day (QID) | ORAL | Status: DC | PRN

## 2018-10-29 MED ORDER — DEXTROSE 50 % IV SOLN
INTRAVENOUS | Status: AC
  Filled 2018-10-29: qty 50

## 2018-10-29 MED ORDER — ASPIRIN EC 81 MG PO TBEC
81.0000 mg | DELAYED_RELEASE_TABLET | Freq: Every day | ORAL | Status: DC
  Administered 2018-10-31 – 2018-11-02 (×3): 81 mg via ORAL
  Filled 2018-10-29 (×6): qty 1

## 2018-10-29 MED ORDER — TRAZODONE HCL 50 MG PO TABS: 50.0000 mg | ORAL_TABLET | Freq: Every evening | ORAL | Status: DC | PRN

## 2018-10-29 MED ORDER — EZETIMIBE 10 MG PO TABS: 10.0000 mg | ORAL_TABLET | Freq: Every day | ORAL | Status: DC

## 2018-10-29 MED ORDER — POLYETHYLENE GLYCOL 3350 17 G PO PACK: 17.0000 g | PACK | Freq: Every day | ORAL | Status: DC | PRN

## 2018-10-29 MED ORDER — HEPARIN SODIUM (PORCINE) 5000 UNIT/ML IJ SOLN
5000.0000 [IU] | Freq: Three times a day (TID) | INTRAMUSCULAR | Status: DC
  Administered 2018-10-29 – 2018-11-02 (×12): 5000 [IU] via SUBCUTANEOUS
  Filled 2018-10-29 (×12): qty 1

## 2018-10-29 MED ORDER — INSULIN ASPART 100 UNIT/ML ~~LOC~~ SOLN: 0.0000 [IU] | Freq: Every day | SUBCUTANEOUS | Status: DC

## 2018-10-29 MED ORDER — ALBUTEROL SULFATE (2.5 MG/3ML) 0.083% IN NEBU: 2.5000 mg | INHALATION_SOLUTION | RESPIRATORY_TRACT | Status: DC | PRN

## 2018-10-29 MED ORDER — VANCOMYCIN HCL 10 G IV SOLR
1250.0000 mg | Freq: Once | INTRAVENOUS | Status: AC
  Administered 2018-10-29: 1250 mg via INTRAVENOUS
  Filled 2018-10-29: qty 1250

## 2018-10-29 MED ORDER — ALBUTEROL SULFATE (2.5 MG/3ML) 0.083% IN NEBU: 3.0000 mL | INHALATION_SOLUTION | Freq: Four times a day (QID) | RESPIRATORY_TRACT | Status: DC | PRN

## 2018-10-29 MED ORDER — SODIUM ZIRCONIUM CYCLOSILICATE 5 G PO PACK
10.0000 g | PACK | Freq: Once | ORAL | Status: AC
  Administered 2018-10-29: 10 g via ORAL
  Filled 2018-10-29: qty 2

## 2018-10-29 MED ORDER — SODIUM BICARBONATE 8.4 % IV SOLN
50.0000 meq | Freq: Once | INTRAVENOUS | Status: AC
  Administered 2018-10-29: 50 meq via INTRAVENOUS

## 2018-10-29 MED ORDER — ALBUTEROL SULFATE HFA 108 (90 BASE) MCG/ACT IN AERS
8.0000 | INHALATION_SPRAY | RESPIRATORY_TRACT | Status: AC
  Administered 2018-10-29: 8 via RESPIRATORY_TRACT
  Filled 2018-10-29: qty 6.7

## 2018-10-29 MED ORDER — IPRATROPIUM-ALBUTEROL 0.5-2.5 (3) MG/3ML IN SOLN: 3.0000 mL | Freq: Four times a day (QID) | RESPIRATORY_TRACT | Status: DC | PRN

## 2018-10-29 MED ORDER — SODIUM CHLORIDE 0.9 % IV BOLUS
1000.0000 mL | Freq: Once | INTRAVENOUS | Status: AC
  Administered 2018-10-29: 06:00:00 1000 mL via INTRAVENOUS

## 2018-10-29 MED ORDER — PANTOPRAZOLE SODIUM 40 MG PO TBEC: 40.0000 mg | DELAYED_RELEASE_TABLET | Freq: Every day | ORAL | Status: DC

## 2018-10-29 MED ORDER — HEPARIN SODIUM (PORCINE) 1000 UNIT/ML DIALYSIS
1000.0000 [IU] | INTRAMUSCULAR | Status: DC | PRN
  Administered 2018-11-01: 2400 [IU] via INTRAVENOUS_CENTRAL
  Filled 2018-10-29: qty 6
  Filled 2018-10-29: qty 5
  Filled 2018-10-29: qty 6
  Filled 2018-10-29: qty 4

## 2018-10-29 MED ORDER — SODIUM CHLORIDE 0.9% FLUSH
3.0000 mL | INTRAVENOUS | Status: DC | PRN
  Administered 2018-11-02: 06:00:00 10 mL via INTRAVENOUS
  Filled 2018-10-29: qty 3

## 2018-10-29 MED ORDER — ORAL CARE MOUTH RINSE
15.0000 mL | Freq: Two times a day (BID) | OROMUCOSAL | Status: DC
  Administered 2018-10-30 (×2): 15 mL via OROMUCOSAL

## 2018-10-29 MED ORDER — OXYCODONE HCL 5 MG PO TABS: 5.0000 mg | ORAL_TABLET | Freq: Four times a day (QID) | ORAL | Status: DC | PRN

## 2018-10-29 MED ORDER — INSULIN ASPART 100 UNIT/ML IV SOLN
10.0000 [IU] | Freq: Once | INTRAVENOUS | Status: AC
  Administered 2018-10-29: 10 [IU] via INTRAVENOUS

## 2018-10-29 MED ORDER — THIAMINE HCL 100 MG/ML IJ SOLN
100.0000 mg | Freq: Every day | INTRAMUSCULAR | Status: DC
  Administered 2018-10-30: 100 mg via INTRAVENOUS
  Filled 2018-10-29: qty 2

## 2018-10-29 MED ORDER — SODIUM BICARBONATE 8.4 % IV SOLN
INTRAVENOUS | Status: AC
  Filled 2018-10-29: qty 50

## 2018-10-29 MED ORDER — FENOFIBRATE 160 MG PO TABS
160.0000 mg | ORAL_TABLET | Freq: Every day | ORAL | Status: DC
  Filled 2018-10-29 (×2): qty 1

## 2018-10-29 MED ORDER — METHYLPREDNISOLONE SODIUM SUCC 40 MG IJ SOLR
40.0000 mg | Freq: Two times a day (BID) | INTRAMUSCULAR | Status: DC
  Administered 2018-10-30: 40 mg via INTRAVENOUS
  Filled 2018-10-29: qty 1

## 2018-10-29 MED ORDER — LABETALOL HCL 200 MG PO TABS: 100.0000 mg | ORAL_TABLET | Freq: Two times a day (BID) | ORAL | Status: DC

## 2018-10-29 MED ORDER — VITAMIN D (ERGOCALCIFEROL) 1.25 MG (50000 UNIT) PO CAPS
50000.0000 [IU] | ORAL_CAPSULE | ORAL | Status: DC
  Filled 2018-10-29: qty 1

## 2018-10-29 MED ORDER — SODIUM CHLORIDE 0.9 % IV BOLUS
1000.0000 mL | Freq: Once | INTRAVENOUS | Status: AC
  Administered 2018-10-29: 23:00:00 1000 mL via INTRAVENOUS

## 2018-10-29 MED ORDER — IPRATROPIUM-ALBUTEROL 0.5-2.5 (3) MG/3ML IN SOLN
3.0000 mL | Freq: Four times a day (QID) | RESPIRATORY_TRACT | Status: DC
  Administered 2018-10-30 – 2018-10-31 (×6): 3 mL via RESPIRATORY_TRACT
  Filled 2018-10-29 (×6): qty 3

## 2018-10-29 MED ORDER — CHLORHEXIDINE GLUCONATE 0.12 % MT SOLN
15.0000 mL | Freq: Two times a day (BID) | OROMUCOSAL | Status: DC
  Administered 2018-10-29 – 2018-10-30 (×3): 15 mL via OROMUCOSAL
  Filled 2018-10-29: qty 15

## 2018-10-29 MED ORDER — ACETAMINOPHEN 325 MG PO TABS: 650.0000 mg | ORAL_TABLET | Freq: Four times a day (QID) | ORAL | Status: DC | PRN

## 2018-10-29 MED ORDER — ONDANSETRON HCL 4 MG/2ML IJ SOLN: 4.0000 mg | Freq: Four times a day (QID) | INTRAMUSCULAR | Status: DC | PRN

## 2018-10-29 MED ORDER — ALBUTEROL SULFATE (2.5 MG/3ML) 0.083% IN NEBU: 2.5000 mg | INHALATION_SOLUTION | Freq: Four times a day (QID) | RESPIRATORY_TRACT | Status: DC | PRN

## 2018-10-29 MED ORDER — INSULIN ASPART 100 UNIT/ML ~~LOC~~ SOLN
5.0000 [IU] | Freq: Once | SUBCUTANEOUS | Status: AC
  Administered 2018-10-29: 07:00:00 5 [IU] via INTRAVENOUS
  Filled 2018-10-29 (×2): qty 1

## 2018-10-29 MED ORDER — STERILE WATER FOR INJECTION IV SOLN
INTRAVENOUS | Status: DC
  Administered 2018-10-29: 13:00:00 via INTRAVENOUS
  Filled 2018-10-29 (×6): qty 850

## 2018-10-29 MED ORDER — CARVEDILOL 3.125 MG PO TABS: 3.1250 mg | ORAL_TABLET | Freq: Two times a day (BID) | ORAL | Status: DC

## 2018-10-29 MED ORDER — NOREPINEPHRINE 4 MG/250ML-% IV SOLN
0.0000 ug/min | INTRAVENOUS | Status: DC
  Administered 2018-10-29: 5 ug/min via INTRAVENOUS
  Administered 2018-10-30: 16 ug/min via INTRAVENOUS
  Filled 2018-10-29: qty 250
  Filled 2018-10-29: qty 500

## 2018-10-29 MED ORDER — DEXTROSE 50 % IV SOLN
1.0000 | Freq: Once | INTRAVENOUS | Status: AC
  Administered 2018-10-29: 50 mL via INTRAVENOUS

## 2018-10-29 MED ORDER — SODIUM CHLORIDE 0.9 % IV BOLUS
1000.0000 mL | Freq: Once | INTRAVENOUS | Status: AC
  Administered 2018-10-29: 22:00:00 1000 mL via INTRAVENOUS

## 2018-10-29 MED ORDER — INSULIN ASPART 100 UNIT/ML ~~LOC~~ SOLN
0.0000 [IU] | Freq: Three times a day (TID) | SUBCUTANEOUS | Status: DC
  Administered 2018-10-29: 18:00:00 3 [IU] via SUBCUTANEOUS
  Administered 2018-10-30: 1 [IU] via SUBCUTANEOUS
  Administered 2018-10-30: 2 [IU] via SUBCUTANEOUS

## 2018-10-29 MED ORDER — PAROXETINE HCL 20 MG PO TABS: 20.0000 mg | ORAL_TABLET | ORAL | Status: DC

## 2018-10-29 MED ORDER — CHLORHEXIDINE GLUCONATE CLOTH 2 % EX PADS
6.0000 | MEDICATED_PAD | Freq: Every day | CUTANEOUS | Status: AC
  Administered 2018-10-29 – 2018-11-03 (×5): 6 via TOPICAL

## 2018-10-29 MED ORDER — SODIUM CHLORIDE 0.9 % IV SOLN
1.0000 g | INTRAVENOUS | Status: DC
  Administered 2018-10-30: 1 g via INTRAVENOUS
  Filled 2018-10-29: qty 1

## 2018-10-29 MED ORDER — CARVEDILOL 12.5 MG PO TABS: 12.5000 mg | ORAL_TABLET | Freq: Two times a day (BID) | ORAL | Status: DC

## 2018-10-29 MED ORDER — FUROSEMIDE 10 MG/ML IJ SOLN
80.0000 mg | Freq: Once | INTRAMUSCULAR | Status: AC
  Administered 2018-10-29: 15:00:00 80 mg via INTRAVENOUS
  Filled 2018-10-29: qty 8

## 2018-10-29 MED ORDER — ACETAMINOPHEN 650 MG RE SUPP: 650.0000 mg | Freq: Four times a day (QID) | RECTAL | Status: DC | PRN

## 2018-10-29 NOTE — ED Notes (Signed)
Date and time results received: 11/09/2018 0840 (use smartphrase ".now" to insert current time)  Test: k+ Critical Value: 6.4   Name of Provider Notified: dr Denton Brick   Orders Received? Or Actions Taken?: see chart

## 2018-10-29 NOTE — Procedures (Signed)
Central Venous Catheter Insertion Procedure Note Kristen Jones [id number] Aug 17, 1950  Procedure: Insertion of Central Venous Catheter - Trialysis catheter Indications: Assessment of intravascular volume, Drug and/or fluid administration and Frequent blood sampling, need for emergent dialysis and vasopressors  Procedure Details Consent: Unable to obtain consent because of altered level of consciousness. Time Out: Verified patient identification, verified procedure, site/side was marked, verified correct patient position, special equipment/implants available, medications/allergies/relevent history reviewed, required imaging and test results available.  Performed  Maximum sterile technique was used including antiseptics, cap, gloves, gown, hand hygiene, mask and sheet. Skin prep: Chlorhexidine; local anesthetic administered A antimicrobial bonded/coated triple lumen trialysis catheter was placed in the right internal jugular vein using the Seldinger technique.  Evaluation Blood flow good Complications: No apparent complications Patient did tolerate procedure well. Chest X-ray ordered to verify placement.  CXR: normal. CVC TIP in SVC  Procedure performed under direct ultrasound guidance for real time vessel cannulation.      Adisa Vigeant R. Ayanni Tun MD 11/05/2018, 11:35 PM

## 2018-10-29 NOTE — Progress Notes (Addendum)
Pt seen again once she was admitted to the ICU.  She is more lethargic with increased respirations.  EKG at 10:14 concerning for an acute MI.  Repeat K elevated at 7.5, however BUN was <5 which is not consistent with her previous value of 122 earlier today.  Will repeat renal panel and also redose with IV insulin and D50.  If she is having an acute MI she will definitely need emergent transfer to [facility] and will likely require initiation of CVVHD following cath.  I discussed this with Dr. Denton Brick who is in agreement.  Will check CPK and MB as well as troponin which was normal earlier today.  Will also repeat EKG.

## 2018-10-29 NOTE — ED Provider Notes (Signed)
[facility] EMERGENCY DEPARTMENT Provider Note   CSN: [id number] Arrival date & time: 10/26/2018  [id number]    History   Chief Complaint Chief Complaint  Patient presents with  . Fall    HPI Kristen Jones is a 68 y.o. female.     Patient presents to the emergency department for evaluation of generalized weakness and frequent falls.  Patient had a fall earlier tonight in the bathroom.  Her friend reportedly found her on the floor and was able to help her up.  She is complaining of right wrist pain, but that pain has been pre-existing since she fell sometime ago and broke her right wrist.  She apparently did not do follow-up for her fracture and took the cast off herself.  Patient has been feeling very weak and this has caused her falls.  She normally lives alone but has friends that check on her frequently.     Past Medical History:  Diagnosis Date  . Arthritis   . COPD (chronic obstructive pulmonary disease) ([place])   . Depression with anxiety 02/02/2018  . Diabetes mellitus   . High cholesterol   . Hypertension   . Mental disorder     Patient Active Problem List   Diagnosis Date Noted  . Oth intartic fracture of lower end of left radius, init 03/12/2018  . Elevated troponin   . Depression with anxiety 02/02/2018  . Hypertensive emergency 02/02/2018  . Flash pulmonary edema ([place]) 02/02/2018  . Encounter for routine gynecological examination with Papanicolaou smear of cervix 11/18/2016  . ANKLE SPRAIN 11/01/2008  . T2DM (type 2 diabetes mellitus) ([place]) 12/09/2007  . HLD (hyperlipidemia) 12/09/2007  . Essential hypertension 12/09/2007  . Asthma 12/09/2007  . GASTROESOPHAGEAL REFLUX DISEASE, CHRONIC 12/09/2007  . IRRITABLE BOWEL SYNDROME 12/09/2007  . SCOLIOSIS 12/09/2007  . ABDOMINAL BLOATING 12/09/2007  . ABDOMINAL PAIN 12/09/2007  . CARPAL TUNNEL RELEASE, HX OF 12/09/2007    Past Surgical History:  Procedure Laterality Date  . CHOLECYSTECTOMY    . KNEE SURGERY     . LEFT HEART CATH AND CORONARY ANGIOGRAPHY N/A 02/05/2018   Procedure: LEFT HEART CATH AND CORONARY ANGIOGRAPHY;  Surgeon: Burnell Blanks, MD;  Location: [place] [facility];  Service: Cardiovascular;  Laterality: N/A;  . MULTIPLE EXTRACTIONS WITH ALVEOLOPLASTY  06/10/2011   Procedure: MULTIPLE EXTRACION WITH ALVEOLOPLASTY;  Surgeon: Gae Bon, DDS;  Location: [place];  Service: Oral Surgery;  Laterality: Bilateral;  Extractions of one,six,eight,nine,eleven,twenty,twenty-one,twenty-two,twenty-four,twenty-five,twenty-six,twenty-seven,twenty-eight,twenty-nine  . OPEN REDUCTION INTERNAL FIXATION (ORIF) DISTAL RADIAL FRACTURE Left 03/12/2018   Procedure: OPEN REDUCTION INTERNAL FIXATION (ORIF) DISTAL RADIAL FRACTURE;  Surgeon: Leanora Cover, MD;  Location: [place];  Service: Orthopedics;  Laterality: Left;  . WRIST FRACTURE SURGERY Left      OB History    Gravida  1   Para  1   Term      Preterm      AB      Living  1     SAB      TAB      Ectopic      Multiple      Live Births               Home Medications    Prior to Admission medications   Medication Sig Start Date End Date Taking? Authorizing Provider  albuterol (PROVENTIL HFA;VENTOLIN HFA) 108 (90 Base) MCG/ACT inhaler Inhale 1 puff into the lungs every 6 (six) hours as needed for wheezing or shortness  of breath.    [provider]  albuterol (PROVENTIL) (2.5 MG/3ML) 0.083% nebulizer solution Take 3 mLs (2.5 mg total) by nebulization every 6 (six) hours as needed for wheezing or shortness of breath. 02/11/18   Fredia Sorrow, MD  albuterol-ipratropium (COMBIVENT) 18-103 MCG/ACT inhaler Inhale 2 puffs into the lungs every 6 (six) hours as needed for wheezing.    [provider]  amLODipine (NORVASC) 10 MG tablet Take 1 tablet (10 mg total) by mouth daily. 02/07/18   Kinnie Feil, MD  aspirin EC 81 MG EC tablet Take 1 tablet (81 mg total) by mouth daily. 02/06/18    Kinnie Feil, MD  carvedilol (COREG) 12.5 MG tablet Take 1 tablet (12.5 mg total) by mouth 2 (two) times daily with a meal. Patient not taking: Reported on 09/21/2018 02/06/18   Kinnie Feil, MD  ezetimibe (ZETIA) 10 MG tablet Take 10 mg by mouth daily.     [provider]  FARXIGA 10 MG TABS tablet Take 10 mg by mouth daily.  02/07/18   [provider]  fenofibrate 160 MG tablet Take 1 tablet (160 mg total) by mouth daily. 02/06/18   Kinnie Feil, MD  furosemide (LASIX) 20 MG tablet Take 20 mg by mouth daily.     [provider]  HYDROcodone-acetaminophen (NORCO) 5-325 MG tablet 1-2 tabs po q6 hours prn pain Patient not taking: Reported on 09/21/2018 03/12/18   Leanora Cover, MD  labetalol (NORMODYNE) 200 MG tablet Take 200 mg by mouth 2 (two) times daily.    [provider]  lisinopril (PRINIVIL,ZESTRIL) 20 MG tablet Take 20 mg by mouth daily.    [provider]  metFORMIN (GLUCOPHAGE) 500 MG tablet Take by mouth 2 (two) times daily with a meal.    [provider]  naproxen (NAPROSYN) 500 MG tablet Take 500 mg by mouth 2 (two) times daily with a meal.  02/09/18   [provider]  oxyCODONE (OXY IR/ROXICODONE) 5 MG immediate release tablet Take 5 mg by mouth 3 (three) times daily.    [provider]  pantoprazole (PROTONIX) 40 MG tablet Take 40 mg by mouth daily.    [provider]  PARoxetine (PAXIL) 20 MG tablet Take 20 mg by mouth every morning.     [provider]  rosuvastatin (CRESTOR) 10 MG tablet Take 1 tablet (10 mg total) by mouth daily at 6 PM. Patient not taking: Reported on 09/21/2018 02/06/18   Kinnie Feil, MD  Vitamin D, Ergocalciferol, (DRISDOL) 1.25 MG (50000 UT) CAPS capsule Take 1 capsule (50,000 Units total) by mouth every 7 (seven) days. 02/06/18   Kinnie Feil, MD    Family History Family History  Problem Relation Age of Onset  . Diabetes Mother   .  Anesthesia problems Neg Hx   . Hypotension Neg Hx   . Malignant hyperthermia Neg Hx   . Pseudochol deficiency Neg Hx     Social History Social History   Tobacco Use  . Smoking status: Former Smoker    Types: Cigarettes  . Smokeless tobacco: Never Used  Substance Use Topics  . Alcohol use: No  . Drug use: No     Allergies   Fluoxetine hcl, Methocarbamol, Metoclopramide hcl, Motrin [ibuprofen], and Rabeprazole sodium   Review of Systems Review of Systems  Constitutional: Positive for fatigue.  Musculoskeletal: Positive for arthralgias.  All other systems reviewed and are negative.    Physical Exam Updated Vital Signs BP Marland Kitchen)  98/51   Pulse 89   Temp 97.6 F (36.4 C) (Oral)   Resp (!) 29   Ht 4\' 10"  (1.473 m)   Wt 56.2 kg   SpO2 100%   BMI 25.89 kg/m   Physical Exam Vitals signs and nursing note reviewed.  Constitutional:      General: She is not in acute distress.    Appearance: Normal appearance. She is well-developed.  HENT:     Head: Normocephalic and atraumatic.     Right Ear: Hearing normal.     Left Ear: Hearing normal.     Nose: Nose normal.  Eyes:     Conjunctiva/sclera: Conjunctivae normal.     Pupils: Pupils are equal, round, and reactive to light.  Neck:     Musculoskeletal: Normal range of motion and neck supple.  Cardiovascular:     Rate and Rhythm: Regular rhythm.     Heart sounds: S1 normal and S2 normal. No murmur. No friction rub. No gallop.   Pulmonary:     Effort: Pulmonary effort is normal. Tachypnea present. No respiratory distress.     Breath sounds: Normal breath sounds.  Chest:     Chest wall: No tenderness.  Abdominal:     General: Bowel sounds are normal.     Palpations: Abdomen is soft.     Tenderness: There is no abdominal tenderness. There is no guarding or rebound. Negative signs include Murphy's sign and McBurney's sign.     Hernia: No hernia is present.  Musculoskeletal: Normal range of motion.     Right wrist: She  exhibits tenderness and deformity.  Skin:    General: Skin is warm and dry.     Findings: No rash.  Neurological:     Mental Status: She is alert and oriented to person, place, and time.     GCS: GCS eye subscore is 4. GCS verbal subscore is 5. GCS motor subscore is 6.     Cranial Nerves: No cranial nerve deficit.     Sensory: No sensory deficit.     Coordination: Coordination normal.  Psychiatric:        Speech: Speech normal.        Behavior: Behavior normal.        Thought Content: Thought content normal.      ED Treatments / Results  Labs (all labs ordered are listed, but only abnormal results are displayed) Labs Reviewed  CBC WITH DIFFERENTIAL/PLATELET - Abnormal; Notable for the following components:      Result Value   RBC 3.44 (*)    Hemoglobin 9.7 (*)    HCT 29.6 (*)    RDW 16.3 (*)    nRBC 0.4 (*)    Neutro Abs 8.5 (*)    Lymphs Abs 0.2 (*)    All other components within normal limits  COMPREHENSIVE METABOLIC PANEL - Abnormal; Notable for the following components:   Sodium 123 (*)    Potassium 7.4 (*)    Chloride 94 (*)    CO2 12 (*)    Glucose, Bld 227 (*)    BUN 126 (*)    Creatinine, Ser 3.36 (*)    Albumin 2.2 (*)    AST 78 (*)    ALT 79 (*)    Total Bilirubin 3.4 (*)    GFR calc non Af Amer 14 (*)    GFR calc Af Amer 16 (*)    Anion gap 17 (*)    All other components within normal limits  BRAIN NATRIURETIC PEPTIDE - Abnormal; Notable for the following components:   B Natriuretic Peptide 730.0 (*)    All other components within normal limits  I-STAT CHEM 8, ED - Abnormal; Notable for the following components:   Sodium 122 (*)    Potassium 7.2 (*)    BUN 123 (*)    Creatinine, Ser 3.80 (*)    Glucose, Bld 220 (*)    TCO2 16 (*)    Hemoglobin 10.9 (*)    HCT 32.0 (*)    All other components within normal limits  SARS CORONAVIRUS 2 (HOSPITAL ORDER, [facility])  URINALYSIS, ROUTINE W REFLEX MICROSCOPIC   BETA-HYDROXYBUTYRIC ACID  LACTIC ACID, PLASMA  TROPONIN I (HIGH SENSITIVITY)  TROPONIN I (HIGH SENSITIVITY)    EKG EKG Interpretation  Date/Time:  Thursday October 29 2018 05:22:10 EDT Ventricular Rate:  89 PR Interval:    QRS Duration: 125 QT Interval:  364 QTC Calculation: 443 R Axis:   40 Text Interpretation:  Sinus rhythm Prolonged PR interval Non-specific intra-ventricular conduction delay Nonspecific ST abnormality Confirmed by Orpah Greek [phone]) on 11/02/2018 6:39:05 AM   Radiology Dg Wrist Complete Right  Result Date: 11/07/2018 CLINICAL DATA:  Shortness of breath with weakness and fall EXAM: RIGHT WRIST - COMPLETE 3+ VIEW COMPARISON:  09/19/2018 FINDINGS: Known distal radius fracture with dorsal impaction and displacement that has progressed. Ulnar positive variance with ulnar head impinging on the carpus. Generalized soft tissue swelling with disorganized and early heterotopic ossification or periosteal reaction. Prominent osteopenia. IMPRESSION: Subacute, unhealed distal radius fracture with significant and progressive posterior displacement and impaction. Electronically Signed   By: Monte Fantasia M.D.   On: 10/27/2018 06:28   Dg Chest Port 1 View  Result Date: 10/28/2018 CLINICAL DATA:  Shortness of breath.  Weakness and fall EXAM: PORTABLE CHEST 1 VIEW COMPARISON:  02/11/2018 FINDINGS: Stable cardiomegaly. Stable aortic contours when allowing for rightward rotation. Artifact from EKG leads. There is no edema, consolidation, effusion, or pneumothorax. Mild interstitial coarsening that is similar to prior. No acute osseous finding. Stable from prior. No acute finding. Mild cardiomegaly. IMPRESSION: Stable from prior.  No acute finding Electronically Signed   By: Monte Fantasia M.D.   On: 11/16/2018 06:25    Procedures .Critical Care Performed by: Orpah Greek, MD Authorized by: Orpah Greek, MD   Critical care provider statement:     Critical care time (minutes):  40   Critical care time was exclusive of:  Separately billable procedures and treating other patients   Critical care was necessary to treat or prevent imminent or life-threatening deterioration of the following conditions:  Circulatory failure, dehydration, metabolic crisis and renal failure   Critical care was time spent personally by me on the following activities:  Ordering and performing treatments and interventions, ordering and review of laboratory studies, development of treatment plan with patient or surrogate, ordering and review of radiographic studies, pulse oximetry, evaluation of patient's response to treatment, re-evaluation of patient's condition, review of old charts and examination of patient   I assumed direction of critical care for this patient from another provider in my specialty: no     (including critical care time)  Medications Ordered in ED Medications  sodium chloride 0.9 % bolus 1,000 mL (1,000 mLs Intravenous New Bag/Given 11/20/2018 0637)    Followed by  sodium chloride 0.9 % bolus 1,000 mL (has no administration in time range)  sodium chloride 0.9 % bolus 1,000 mL (1,000  mLs Intravenous New Bag/Given 10/26/2018 0609)  calcium gluconate inj 10% (1 g) URGENT USE ONLY! (1 g Intravenous Given 10/24/2018 0630)  sodium bicarbonate injection 50 mEq (50 mEq Intravenous Not Given 10/28/2018 0649)  insulin aspart (novoLOG) injection 5 Units (5 Units Intravenous Given 11/14/2018 0648)  albuterol (VENTOLIN HFA) 108 (90 Base) MCG/ACT inhaler 8 puff (8 puffs Inhalation Given 11/04/2018 [id number])     Initial Impression / Assessment and Plan / ED Course  I have reviewed the triage vital signs and the nursing notes.  Pertinent labs & imaging results that were available during my care of the patient were reviewed by me and considered in my medical decision making (see chart for details).        Patient brought to the emergency department by EMS from home.  A friend  checked on her and found her to be extremely weak.  Patient has had multiple falls recently.  Patient had a fall earlier tonight and was unable to get up.  She denies any specific injury from the fall tonight but is complaining of right arm pain.  She did have a fall on June 27, suffering a right distal radius fracture.  She was placed in a splint.  She was seen by Dr. Aline Brochure who was arranging for her to have consultation with a hand surgeon, but patient took the splint off herself and never followed up.  She has obvious deformity on examination.  X-ray shows persistent and nonhealing fracture with progressive posterior displacement and impaction.  This is subacute, will be placed back in a splint and will need follow-up.  Patient hypotensive at arrival.  No infectious symptoms noted.  EKG performed was concerning for wide-complex with concordant ST elevations in inferior and lateral leads.  Discussed with Dr. Irish Lack, on-call for interventional cardiology.  He did review the EKG and felt that as the patient was not having active chest pain that she did not require any intervention immediately.  Recommended continuing medical work-up.  There was delay in obtaining lab results because of equipment malfunction in the lab.  Ultimately patient found to have acute renal failure with hyperkalemia which explains her EKG changes.  This is likely secondary to profound dehydration.  She does, however, have a moderate anion gap acidosis present.  Glucose is 220, but cannot rule out DKA (?kussmaul with tachypnea).  Will add on beta hydroxybutyric acid and lactic acid levels.  Patient treated with aggressive IV hydration, calcium, bicarb, albuterol and IV insulin. Will add Lokelma 10G. Discussed with Dr. Johnney Ou, who agrees with plan. She does not feel that patient required transfer to [facility], can stay at [facility]. Will follow.  Final Clinical Impressions(s) / ED Diagnoses   Final diagnoses:  High anion gap  metabolic acidosis  Acute renal failure, unspecified acute renal failure type [facility])  Hyperkalemia  Dehydration    ED Discharge Orders    None       Orpah Greek, MD 10/26/2018 [phone]

## 2018-10-29 NOTE — Progress Notes (Signed)
PT Cancellation Note  Patient Details Name: Kristen Jones MRN: [id number] DOB: [birth year]   Cancelled Treatment:    Reason Eval/Treat Not Completed: Medical issues which prohibited therapy   Rayetta Humphrey, PT CLT [phone] 10/30/2018, 3:23 PM

## 2018-10-29 NOTE — H&P (Signed)
Patient Demographics:    Kristen Jones, is a 68 y.o. female  MRN: [id number]   DOB - [birth year]  Admit Date - 11/21/2018  Outpatient Primary MD for the patient is Lucia Gaskins, MD   Assessment & Plan:    Principal Problem:   Hyperkalemia Active Problems:   T2DM (type 2 diabetes mellitus) ([place])   Delayed union of distal radius fracture, right, closed   Depression with anxiety   AKI (acute kidney injury) ([place])   1) AKI with severe hyperkalemia----acute kidney injury on CKD stage - III-worsening renal function due to dehydration, compounded by metformin, Lasix and lisinopril use creatinine on admission= 3.36,   baseline creatinine = 1.2-1.4    , creatinine is now= 3.8     , renally adjust medications, avoid nephrotoxic agents/dehydration/hypotension -Hold Farxiga, hold metformin, hold lisinopril, hold Lasix --Patient received aggressive IV boluses --Nephrology input appreciated,  -start bicarb drip, Anion gap is 14, bicarb is persistently low --Transfer to [place] for possible CRRT--patient cannot tolerate conventional HD due to borderline hypotension despite aggressive IV fluids  2)Severe hyperkalemia secondary to #1 above--admission potassium was 7.4,  -hold lisinopril,  --Patient received hyperkalemia cocktail, patient also received Lokelma 10 mg x 1, --potassium remains stubbornly high -Start bicarb drip -Give Kayexalate enema -Check BMPs every 4 hours and address hyperkalemia   3)Acute hypoxic respiratory failure --- multifactorial, chest x-ray without acute findings, however patient has history of Asthma/COPD--clinically no significant flareup at this time, continue bronchodilators and supplemental oxygen  4)DM2-A1c is 8.8 reflecting uncontrolled diabetes, however patient's oral intake is  currently unreliable,  Allow some permissive Hyperglycemia rather than risk life-threatening hypoglycemia in a patient with unreliable oral intake. Use Novolog/Humalog Sliding scale insulin with Accu-Cheks/Fingersticks as ordered -Hold Farxiga, hold metformin due to kidney concerns and acidosis   5)Social/Ethics--unable to reach listed next of kin Ms. Verdie Drown-- [phone] remains a full code  6)H/o mild to moderate nonobstructive CAD-please see LHC report from 02/05/2018 below, continue aspirin, Coreg per parameters, Crestor as ordered --Patient has ruled out for ACS by serial troponins and serial EKGs which are negative for ACS at this time  7)HTN--currently hypotensive, discontinued amlodipine, labetalol, Lasix and lisinopril, Coreg is been reduced to 3.125 mg twice daily with parameters to hold  8)History of Chronic pain syndrome/depression anxiety-continue Paxil daily, trazodone as needed for sleep, oxycodone as needed for pain --be judicious with opiates however avoid abrupt discontinuation of opiates in order to avoid precipitating withdrawal  9)HFrEF---  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  Mid RCA to Dist RCA lesion is 30% stenosed.  Ost Cx to Mid Cx lesion is 20% stenosed.  Ost Ramus lesion is 40% stenosed.  Ramus lesion is 20% stenosed.  Prox LAD to Mid LAD lesion is 20% stenosed.  1. Mild to moderate non-obstructive CAD 2. Mild elevation LVEDP   --Disposition- Persistent hyperkalemia and borderline low blood pressures --Nephrology service requested transfer to PCCM service/ICU at [facility]  to allow for CRRT-- Dr. Audria Nine accepting--  --Hospitalist service will sign off at this time --PCCM will comanage with nephrology service (if blood pressure remains low potassium remains high patient will need a Central Line For CRRT) -PCCM will call hospitalist service when patient is ready to come out of the ICU-  With History of - Reviewed by me  Past Medical History:  Diagnosis Date  . Arthritis   . COPD (chronic obstructive pulmonary disease) ([place])   . Depression with anxiety 02/02/2018  . Diabetes mellitus   . High cholesterol   . Hypertension   . Mental disorder       Past Surgical History:  Procedure Laterality Date  . CHOLECYSTECTOMY    . KNEE SURGERY    . LEFT HEART CATH AND CORONARY ANGIOGRAPHY N/A 02/05/2018   Procedure: LEFT HEART CATH AND CORONARY ANGIOGRAPHY;  Surgeon: Burnell Blanks, MD;  Location: [facility];  Service: Cardiovascular;  Laterality: N/A;  . MULTIPLE EXTRACTIONS WITH ALVEOLOPLASTY  06/10/2011   Procedure: MULTIPLE EXTRACION WITH ALVEOLOPLASTY;  Surgeon: Gae Bon, DDS;  Location: [facility];  Service: Oral Surgery;  Laterality: Bilateral;  Extractions of one,six,eight,nine,eleven,twenty,twenty-one,twenty-two,twenty-four,twenty-five,twenty-six,twenty-seven,twenty-eight,twenty-nine  . OPEN REDUCTION INTERNAL FIXATION (ORIF) DISTAL RADIAL FRACTURE Left 03/12/2018   Procedure: OPEN REDUCTION INTERNAL FIXATION (ORIF) DISTAL RADIAL FRACTURE;  Surgeon: Leanora Cover, MD;  Location: [facility];  Service: Orthopedics;  Laterality: Left;  . WRIST FRACTURE SURGERY Left       Chief Complaint  Patient presents with  . Fall      HPI:    Kristen Jones  is a 68 y.o. female with DM, HTN, HLD, COPD depression/anxiety combined systolic and diastolic dysfunction CHF with EF  of 45% per echo from 02/02/2018, LHC from 02/05/2018 with mild to moderate non-obstructive CAD and CKD 3 who presents to the ED with right wrist pain after falling at home  --Apparently his friend found at home after falling, she has been weak, --- History is limited as patient is somewhat lethargic at the time of my evaluation  --She was persistently hypotensive with systolic pressure in the [id number] and 80s--- she required 3 L of IV fluids to bring her systolic blood pressure above 90 --her friend who found her home is no longer available, I could not reach her friend by phone either, patient is a very poor historian --In ED serial troponins are negative, EKG without ACS pattern, without acute findings, right wrist x-rays with nonhealing fracture of the distal radius  In ED--creatinine is up to 3.8 from a baseline around 1.2-1.4, potassium is persistently above 7 despite hyperkalemia cocktail and Lokelma -WBC is 9.2, hemoglobin is 9.7 is not far from patient's previous baseline back in November 2019  --CT head pending  --Disposition- Persistent hyperkalemia and borderline low blood pressures --Nephrology service requested transfer to PCCM service/ICU at [facility]  to allow for CRRT-- Dr. Audria Nine accepting--  --Hospitalist service will sign off at this time --PCCM will comanage with nephrology  service (if blood pressure remains low potassium remains high patient will need a Central Line For CRRT) -PCCM will call hospitalist service when patient is ready to come out of the ICU-    Review of systems:    In addition to the HPI above,   A full Review of  Systems was done, all other systems reviewed are negative except as noted above in HPI , .    Social History:  Reviewed by me    Social History   Tobacco Use  . Smoking status: Former Smoker    Types: Cigarettes  . Smokeless tobacco: Never Used  Substance Use Topics  . Alcohol use: No     Family History :  Reviewed by  me    Family History  Problem Relation Age of Onset  . Diabetes Mother   . Anesthesia problems Neg Hx   . Hypotension Neg Hx   . Malignant hyperthermia Neg Hx   . Pseudochol deficiency Neg Hx      Home Medications:   Prior to Admission medications   Medication Sig Start Date End Date Taking? Authorizing Provider  albuterol (PROVENTIL HFA;VENTOLIN HFA) 108 (90 Base) MCG/ACT inhaler Inhale 1 puff into the lungs every 6 (six) hours as needed for wheezing or shortness of breath.    [provider]  albuterol (PROVENTIL) (2.5 MG/3ML) 0.083% nebulizer solution Take 3 mLs (2.5 mg total) by nebulization every 6 (six) hours as needed for wheezing or shortness of breath. 02/11/18   Fredia Sorrow, MD  albuterol-ipratropium (COMBIVENT) 18-103 MCG/ACT inhaler Inhale 2 puffs into the lungs every 6 (six) hours as needed for wheezing.    [provider]  amLODipine (NORVASC) 10 MG tablet Take 1 tablet (10 mg total) by mouth daily. 02/07/18   Kinnie Feil, MD  aspirin EC 81 MG EC tablet Take 1 tablet (81 mg total) by mouth daily. 02/06/18   Kinnie Feil, MD  carvedilol (COREG) 12.5 MG tablet Take 1 tablet (12.5 mg total) by mouth 2 (two) times daily with a meal. Patient not taking: Reported on 09/21/2018 02/06/18   Kinnie Feil, MD  ezetimibe (ZETIA) 10 MG tablet Take 10 mg by mouth daily.     [provider]  FARXIGA 10 MG TABS tablet Take 10 mg by mouth daily.  02/07/18   [provider]  fenofibrate 160 MG tablet Take 1 tablet (160 mg total) by mouth daily. 02/06/18   Kinnie Feil, MD  furosemide (LASIX) 20 MG tablet Take 20 mg by mouth daily.     [provider]  HYDROcodone-acetaminophen (NORCO) 5-325 MG tablet 1-2 tabs po q6 hours prn pain Patient not taking: Reported on 09/21/2018 03/12/18   Leanora Cover, MD  labetalol (NORMODYNE) 200 MG tablet Take 200 mg by mouth 2 (two) times daily.    [provider]  lisinopril  (PRINIVIL,ZESTRIL) 20 MG tablet Take 20 mg by mouth daily.    [provider]  metFORMIN (GLUCOPHAGE) 500 MG tablet Take by mouth 2 (two) times daily with a meal.    [provider]  naproxen (NAPROSYN) 500 MG tablet Take 500 mg by mouth 2 (two) times daily with a meal.  02/09/18   [provider]  oxyCODONE (OXY IR/ROXICODONE) 5 MG immediate release tablet Take 5 mg by mouth 3 (three) times daily.    [provider]  pantoprazole (PROTONIX) 40 MG tablet Take 40 mg by mouth daily.    [provider]  PARoxetine (  PAXIL) 20 MG tablet Take 20 mg by mouth every morning.     [provider]  rosuvastatin (CRESTOR) 10 MG tablet Take 1 tablet (10 mg total) by mouth daily at 6 PM. Patient not taking: Reported on 09/21/2018 02/06/18   Kinnie Feil, MD  Vitamin D, Ergocalciferol, (DRISDOL) 1.25 MG (50000 UT) CAPS capsule Take 1 capsule (50,000 Units total) by mouth every 7 (seven) days. 02/06/18   Kinnie Feil, MD     Allergies:     Allergies  Allergen Reactions  . Fluoxetine Hcl     REACTION: UNKNOWN REACTION  . Methocarbamol Nausea And Vomiting  . Metoclopramide Hcl     REACTION: UNKNOWN REACTION  . Motrin [Ibuprofen] Other (See Comments)    REACTION UNKNOWN  . Rabeprazole Sodium     REACTION: UNKNOWN REACTION     Physical Exam:   Vitals  Blood pressure (!) 103/52, pulse (!) 101, temperature 97.6 F (36.4 C), temperature source Oral, resp. rate (!) 26, height 4\' 10"  (1.473 m), weight 55.9 kg, SpO2 100 %.  Physical Examination: General appearance -Sleepy, arousable, mental status -lethargy, query confused  eyes - sclera anicteric Nose- [place] 2 L/min  Neck - supple, no JVD elevation , Chest - clear  to auscultation bilaterally, symmetrical air movement,  Heart - S1 and S2 normal, irregular but not irregularly irregular Abdomen - soft, nontender, nondistended, no masses or organomegaly Neurological -  moving all extremities  well , sleepy  extremities - no pedal edema noted, intact peripheral pulses  Skin - warm, dry     Data Review:    CBC Recent Labs  Lab 10/24/2018 0533 11/05/2018 0603  WBC 9.2  --   HGB 9.7* 10.9*  HCT 29.6* 32.0*  PLT 238  --   MCV 86.0  --   MCH 28.2  --   MCHC 32.8  --   RDW 16.3*  --   LYMPHSABS 0.2*  --   MONOABS 0.3  --   EOSABS 0.1  --   BASOSABS 0.0  --    ------------------------------------------------------------------------------------------------------------------  Chemistries  Recent Labs  Lab 11/21/2018 0533 11/01/2018 0603 11/20/2018 0821 11/14/2018 1248 11/02/2018 1424  NA 123* 122* 128* 124* 129*  K 7.4* 7.2* 6.4* 7.5* 6.9*  CL 94* 98 100 98 104  CO2 12*  --  14* 12* 11*  GLUCOSE 227* 220* 171* 220* 120*  BUN 126* 123* 122* 132* 120*  CREATININE 3.36* 3.80* 3.25* 3.53* 3.14*  CALCIUM 9.0  --  8.7* 9.0 7.9*  MG  --   --  1.7  --   --   AST 78*  --   --   --   --   ALT 79*  --   --   --   --   ALKPHOS 126  --   --   --   --   BILITOT 3.4*  --   --   --   --    ------------------------------------------------------------------------------------------------------------------ estimated creatinine clearance is 13.2 mL/min (A) (by C-G formula based on SCr of 3.14 mg/dL (H)). ------------------------------------------------------------------------------------------------------------------ No results for input(s): TSH, T4TOTAL, T3FREE, THYROIDAB in the last 72 hours.  Invalid input(s): FREET3   Coagulation profile No results for input(s): INR, PROTIME in the last 168 hours. ------------------------------------------------------------------------------------------------------------------- No results for input(s): DDIMER in the last 72 hours. -------------------------------------------------------------------------------------------------------------------  Cardiac Enzymes Recent Labs  Lab 11/08/2018 1424  [facility] PENDING    ------------------------------------------------------------------------------------------------------------------    Component Value Date/Time   BNP 730.0 (  H) 10/27/2018 0533     ---------------------------------------------------------------------------------------------------------------  Urinalysis    Component Value Date/Time   COLORURINE YELLOW 06/22/2016 1200   APPEARANCEUR CLOUDY (A) 06/22/2016 1200   LABSPEC 1.016 06/22/2016 1200   PHURINE 5.0 06/22/2016 1200   GLUCOSEU 50 (A) 06/22/2016 1200   HGBUR SMALL (A) 06/22/2016 1200   BILIRUBINUR NEGATIVE 06/22/2016 1200   KETONESUR 5 (A) 06/22/2016 1200   PROTEINUR NEGATIVE 06/22/2016 1200   UROBILINOGEN 0.2 12/09/2009 0202   NITRITE POSITIVE (A) 06/22/2016 1200   LEUKOCYTESUR LARGE (A) 06/22/2016 1200    ----------------------------------------------------------------------------------------------------------------   Imaging Results:    Dg Wrist Complete Right  Result Date: 11/05/2018 CLINICAL DATA:  Shortness of breath with weakness and fall EXAM: RIGHT WRIST - COMPLETE 3+ VIEW COMPARISON:  09/19/2018 FINDINGS: Known distal radius fracture with dorsal impaction and displacement that has progressed. Ulnar positive variance with ulnar head impinging on the carpus. Generalized soft tissue swelling with disorganized and early heterotopic ossification or periosteal reaction. Prominent osteopenia. IMPRESSION: Subacute, unhealed distal radius fracture with significant and progressive posterior displacement and impaction. Electronically Signed   By: Monte Fantasia M.D.   On: 10/26/2018 06:28   Dg Chest Port 1 View  Result Date: 10/24/2018 CLINICAL DATA:  Shortness of breath.  Weakness and fall EXAM: PORTABLE CHEST 1 VIEW COMPARISON:  02/11/2018 FINDINGS: Stable cardiomegaly. Stable aortic contours when allowing for rightward rotation. Artifact from EKG leads. There is no edema, consolidation, effusion, or pneumothorax. Mild  interstitial coarsening that is similar to prior. No acute osseous finding. Stable from prior. No acute finding. Mild cardiomegaly. IMPRESSION: Stable from prior.  No acute finding Electronically Signed   By: Monte Fantasia M.D.   On: 11/19/2018 06:25    Radiological Exams on Admission: Dg Wrist Complete Right  Result Date: 11/05/2018 CLINICAL DATA:  Shortness of breath with weakness and fall EXAM: RIGHT WRIST - COMPLETE 3+ VIEW COMPARISON:  09/19/2018 FINDINGS: Known distal radius fracture with dorsal impaction and displacement that has progressed. Ulnar positive variance with ulnar head impinging on the carpus. Generalized soft tissue swelling with disorganized and early heterotopic ossification or periosteal reaction. Prominent osteopenia. IMPRESSION: Subacute, unhealed distal radius fracture with significant and progressive posterior displacement and impaction. Electronically Signed   By: Monte Fantasia M.D.   On: 10/28/2018 06:28   Dg Chest Port 1 View  Result Date: 11/02/2018 CLINICAL DATA:  Shortness of breath.  Weakness and fall EXAM: PORTABLE CHEST 1 VIEW COMPARISON:  02/11/2018 FINDINGS: Stable cardiomegaly. Stable aortic contours when allowing for rightward rotation. Artifact from EKG leads. There is no edema, consolidation, effusion, or pneumothorax. Mild interstitial coarsening that is similar to prior. No acute osseous finding. Stable from prior. No acute finding. Mild cardiomegaly. IMPRESSION: Stable from prior.  No acute finding Electronically Signed   By: Monte Fantasia M.D.   On: 11/14/2018 06:25    DVT Prophylaxis -SCD /heparin AM Labs Ordered, also please review Full Orders  Family Communication: Admission, patients condition and plan of care including tests being ordered have been discussed with the patient  Who nodded  Code Status - Full Code  Likely DC to  ---Disposition- Persistent hyperkalemia and borderline low blood pressures --Nephrology service requested transfer  to PCCM service/ICU at [facility]  to allow for CRRT-- Dr. Audria Nine accepting--  --Hospitalist service will sign off at this time --PCCM will comanage with nephrology service (if blood pressure remains low potassium remains high patient will need a Central Line For CRRT) -PCCM will call  hospitalist service when patient is ready to come out of the ICU-  Condition   gaurded  Roxan Hockey M.D on 11/13/2018 at 5:27 PM Go to [email or web address] -  for contact info  [facility] - Office  [phone]

## 2018-10-29 NOTE — Consult Note (Addendum)
Reason for Consult: AKI/CKD stage 3 and hyperkalemia Referring Physician:  Rosalva Ferron, MD  Kristen Jones is an 68 y.o. female.  HPI:  Kristen Jones has a PMH significant for HTN, DM, HLD, COPD, depression, and CKD stage 3 who was brought to the [facility] ED by friends with generalized weakness and frequent falls.  In the ED she was noted to be hypotensive and labs were remarkable for AKI/CKD, hyperkalemia, metabolic acidosis, and anemia.  We were consulted to further evaluate and manage her AKI and electrolyte abnormalities.  The trend in Scr is seen below.  Her K was 7.2, BUN/Cr 123/3.8, Na 122 which improved to K 6.4, BUN/Cr 122/3.25, and Na 128 after she received IVF's.  She is only able to moan in response to questions so this HPI was obtained through the review of the existing medical records.    Trend in Creatinine: Creatinine, Ser  Date/Time Value Ref Range Status  11/07/2018 08:21 AM 3.25 (H) 0.44 - 1.00 mg/dL Final  11/11/2018 06:03 AM 3.80 (H) 0.44 - 1.00 mg/dL Final  11/04/2018 05:33 AM 3.36 (H) 0.44 - 1.00 mg/dL Final  03/12/2018 12:37 PM 1.20 (H) 0.44 - 1.00 mg/dL Final  02/11/2018 06:11 PM 1.48 (H) 0.44 - 1.00 mg/dL Final  02/05/2018 02:14 AM 1.26 (H) 0.44 - 1.00 mg/dL Final  02/04/2018 04:33 AM 1.43 (H) 0.44 - 1.00 mg/dL Final  02/03/2018 03:51 AM 1.88 (H) 0.44 - 1.00 mg/dL Final  02/02/2018 07:21 AM 1.20 (H) 0.44 - 1.00 mg/dL Final  06/22/2016 01:56 PM 1.70 (H) 0.44 - 1.00 mg/dL Final  06/10/2011 07:01 AM 0.90 0.50 - 1.10 mg/dL Final  12/09/2009 02:02 AM 0.65 0.4 - 1.2 mg/dL Final  10/16/2007 05:05 AM 1.14  Final  10/15/2007 04:00 AM 1.68 (H)  Final  10/14/2007 06:10 PM 2.12 DELTA CHECK NOTED (H)  Final  10/14/2007 01:52 AM 1.31 (H)  Final  09/09/2007 06:05 AM 1.44 (H)  Final  09/08/2007 05:30 PM 1.80 (H)  Final  09/12/2006 12:20 PM 1.31 (H)  Final    PMH:   Past Medical History:  Diagnosis Date  . Arthritis   . COPD (chronic obstructive pulmonary disease) ([place])   .  Depression with anxiety 02/02/2018  . Diabetes mellitus   . High cholesterol   . Hypertension   . Mental disorder     PSH:   Past Surgical History:  Procedure Laterality Date  . CHOLECYSTECTOMY    . KNEE SURGERY    . LEFT HEART CATH AND CORONARY ANGIOGRAPHY N/A 02/05/2018   Procedure: LEFT HEART CATH AND CORONARY ANGIOGRAPHY;  Surgeon: Burnell Blanks, MD;  Location: [place] [facility];  Service: Cardiovascular;  Laterality: N/A;  . MULTIPLE EXTRACTIONS WITH ALVEOLOPLASTY  06/10/2011   Procedure: MULTIPLE EXTRACION WITH ALVEOLOPLASTY;  Surgeon: Gae Bon, DDS;  Location: [place];  Service: Oral Surgery;  Laterality: Bilateral;  Extractions of one,six,eight,nine,eleven,twenty,twenty-one,twenty-two,twenty-four,twenty-five,twenty-six,twenty-seven,twenty-eight,twenty-nine  . OPEN REDUCTION INTERNAL FIXATION (ORIF) DISTAL RADIAL FRACTURE Left 03/12/2018   Procedure: OPEN REDUCTION INTERNAL FIXATION (ORIF) DISTAL RADIAL FRACTURE;  Surgeon: Leanora Cover, MD;  Location: [place];  Service: Orthopedics;  Laterality: Left;  . WRIST FRACTURE SURGERY Left     Allergies:  Allergies  Allergen Reactions  . Fluoxetine Hcl     REACTION: UNKNOWN REACTION  . Methocarbamol Nausea And Vomiting  . Metoclopramide Hcl     REACTION: UNKNOWN REACTION  . Motrin [Ibuprofen] Other (See Comments)    REACTION UNKNOWN  . Rabeprazole Sodium  REACTION: UNKNOWN REACTION    Medications:   Prior to Admission medications   Medication Sig Start Date End Date Taking? Authorizing Provider  albuterol (PROVENTIL HFA;VENTOLIN HFA) 108 (90 Base) MCG/ACT inhaler Inhale 1 puff into the lungs every 6 (six) hours as needed for wheezing or shortness of breath.    [provider]  albuterol (PROVENTIL) (2.5 MG/3ML) 0.083% nebulizer solution Take 3 mLs (2.5 mg total) by nebulization every 6 (six) hours as needed for wheezing or shortness of breath. 02/11/18   Fredia Sorrow, MD   albuterol-ipratropium (COMBIVENT) 18-103 MCG/ACT inhaler Inhale 2 puffs into the lungs every 6 (six) hours as needed for wheezing.    [provider]  amLODipine (NORVASC) 10 MG tablet Take 1 tablet (10 mg total) by mouth daily. 02/07/18   Kinnie Feil, MD  aspirin EC 81 MG EC tablet Take 1 tablet (81 mg total) by mouth daily. 02/06/18   Kinnie Feil, MD  carvedilol (COREG) 12.5 MG tablet Take 1 tablet (12.5 mg total) by mouth 2 (two) times daily with a meal. Patient not taking: Reported on 09/21/2018 02/06/18   Kinnie Feil, MD  ezetimibe (ZETIA) 10 MG tablet Take 10 mg by mouth daily.     [provider]  FARXIGA 10 MG TABS tablet Take 10 mg by mouth daily.  02/07/18   [provider]  fenofibrate 160 MG tablet Take 1 tablet (160 mg total) by mouth daily. 02/06/18   Kinnie Feil, MD  furosemide (LASIX) 20 MG tablet Take 20 mg by mouth daily.     [provider]  HYDROcodone-acetaminophen (NORCO) 5-325 MG tablet 1-2 tabs po q6 hours prn pain Patient not taking: Reported on 09/21/2018 03/12/18   Leanora Cover, MD  labetalol (NORMODYNE) 200 MG tablet Take 200 mg by mouth 2 (two) times daily.    [provider]  lisinopril (PRINIVIL,ZESTRIL) 20 MG tablet Take 20 mg by mouth daily.    [provider]  metFORMIN (GLUCOPHAGE) 500 MG tablet Take by mouth 2 (two) times daily with a meal.    [provider]  naproxen (NAPROSYN) 500 MG tablet Take 500 mg by mouth 2 (two) times daily with a meal.  02/09/18   [provider]  oxyCODONE (OXY IR/ROXICODONE) 5 MG immediate release tablet Take 5 mg by mouth 3 (three) times daily.    [provider]  pantoprazole (PROTONIX) 40 MG tablet Take 40 mg by mouth daily.    [provider]  PARoxetine (PAXIL) 20 MG tablet Take 20 mg by mouth every morning.     [provider]  rosuvastatin (CRESTOR) 10 MG tablet Take 1 tablet (10 mg total) by mouth daily  at 6 PM. Patient not taking: Reported on 09/21/2018 02/06/18   Kinnie Feil, MD  Vitamin D, Ergocalciferol, (DRISDOL) 1.25 MG (50000 UT) CAPS capsule Take 1 capsule (50,000 Units total) by mouth every 7 (seven) days. 02/06/18   Kinnie Feil, MD    Inpatient medications: . insulin aspart  0-15 Units Subcutaneous TID WC  . insulin aspart  0-5 Units Subcutaneous QHS    Discontinued Meds:   Medications Discontinued During This Encounter  Medication Reason  . albuterol (PROVENTIL) (2.5 MG/3ML) 0.083% nebulizer solution 10 mg     Social History:  reports that she has quit smoking. Her smoking use included cigarettes. She has never used smokeless tobacco. She reports that she does not drink alcohol or use drugs.  Family History:  Family History  Problem Relation Age of Onset  . Diabetes Mother   . Anesthesia problems Neg Hx   . Hypotension Neg Hx   . Malignant hyperthermia Neg Hx   . Pseudochol deficiency Neg Hx     Pertinent items are noted in HPI. Weight change:   Intake/Output Summary (Last 24 hours) at 11/02/2018 1026 Last data filed at 11/02/2018 0844 Gross per 24 hour  Intake 1000 ml  Output -  Net 1000 ml   BP (!) 87/52   Pulse 90   Temp 97.6 F (36.4 C) (Oral)   Resp (!) 27   Ht 4\' 10"  (1.473 m)   Wt 56.2 kg   SpO2 99%   BMI 25.89 kg/m  Vitals:   11/15/2018 0730 10/26/2018 0814 11/06/2018 0909 11/23/2018 1000  BP: (!) 85/71 (!) 93/56 (!) 84/61 (!) 87/52  Pulse: (!) 103 94  90  Resp: (!) 26 (!) 21 (!) 27 (!) 27  Temp:      TempSrc:      SpO2: (!) 88% 100%  99%  Weight:      Height:         General appearance: mild distress, slowed mentation and toxic Head: Normocephalic, without obvious abnormality, atraumatic Resp: clear to auscultation bilaterally Cardio: no rub GI: soft, non-tender; bowel sounds normal; no masses,  no organomegaly Extremities: extremities normal, atraumatic, no cyanosis or edema  Labs: Basic Metabolic Panel: Recent Labs  Lab  11/05/2018 0533 10/25/2018 0603 11/12/2018 0821  NA 123* 122* 128*  K 7.4* 7.2* 6.4*  CL 94* 98 100  CO2 12*  --  14*  GLUCOSE 227* 220* 171*  BUN 126* 123* 122*  CREATININE 3.36* 3.80* 3.25*  ALBUMIN 2.2*  --   --   CALCIUM 9.0  --  8.7*   Liver Function Tests: Recent Labs  Lab 11/22/2018 0533  AST 78*  ALT 79*  ALKPHOS 126  BILITOT 3.4*  PROT 6.5  ALBUMIN 2.2*   No results for input(s): LIPASE, AMYLASE in the last 168 hours. No results for input(s): AMMONIA in the last 168 hours. CBC: Recent Labs  Lab 11/09/2018 0533 10/30/2018 0603  WBC 9.2  --   NEUTROABS 8.5*  --   HGB 9.7* 10.9*  HCT 29.6* 32.0*  MCV 86.0  --   PLT 238  --    PT/INR: @LABRCNTIP (inr:5) Cardiac Enzymes: )No results for input(s): CKTOTAL, CKMB, CKMBINDEX, TROPONINI in the last 168 hours. CBG: No results for input(s): GLUCAP in the last 168 hours.  Iron Studies: No results for input(s): IRON, TIBC, TRANSFERRIN, FERRITIN in the last 168 hours.  Xrays/Other Studies: Dg Wrist Complete Right  Result Date: 11/10/2018 CLINICAL DATA:  Shortness of breath with weakness and fall EXAM: RIGHT WRIST - COMPLETE 3+ VIEW COMPARISON:  09/19/2018 FINDINGS: Known distal radius fracture with dorsal impaction and displacement that has progressed. Ulnar positive variance with ulnar head impinging on the carpus. Generalized soft tissue swelling with disorganized and early heterotopic ossification or periosteal reaction. Prominent osteopenia. IMPRESSION: Subacute, unhealed distal radius fracture with significant and progressive posterior displacement and impaction. Electronically Signed   By: Monte Fantasia M.D.   On: 11/02/2018 06:28   Dg Chest Port 1 View  Result Date: 11/11/2018 CLINICAL DATA:  Shortness of breath.  Weakness and fall EXAM: PORTABLE CHEST 1 VIEW COMPARISON:  02/11/2018 FINDINGS: Stable cardiomegaly. Stable aortic contours when allowing for rightward rotation. Artifact from EKG leads. There is no edema,  consolidation, effusion, or pneumothorax. Mild interstitial  coarsening that is similar to prior. No acute osseous finding. Stable from prior. No acute finding. Mild cardiomegaly. IMPRESSION: Stable from prior.  No acute finding Electronically Signed   By: Monte Fantasia M.D.   On: 11/14/2018 06:25     Assessment/Plan: 1.  AKI/CKD stage 3 presumably due to ischemic ATN in setting of hypotension, volume depletion and concomitant ACE inhibition.   1. Continue to hold lisinopril and metformin 2. Check renal US to r/o obstruction 3. No urgent indication for HD at this time, and hopefully we can avoid HD but if she continues to deteriorate, she will need to be transferred to [facility] ICU for CVVHD 2. Hyperkalemia - given meds and IVF's with some improvement over the past 2 hours.   1. Start isotonic bicarb and follow K levels 3. Metabolic acidosis- due to #1 and continue to hold metformin. 1. Start isotonic bicarb 4. Hypotension/sepsis- unclear source.  Cont with IVF's and recommend admission to ICU for closer monitoring.  Pan culture and empiric abx per primary svc and may require pressors 5. Hyponatremia- improving with IVF's. 6. AMS- pt minimally arousable.  Workup per primary svc 7. Falls- s/p wrist fracture recently.   Broadus John A Tabitha Riggins 10/24/2018, 10:26 AM

## 2018-10-29 NOTE — Progress Notes (Signed)
Pharmacy Antibiotic Note  Kristen Jones is a 68 y.o. female admitted on 11/07/2018 with sepsis, weakness, PNA r/o, wound infection?  Severe AKI likely to need CRRT  Plan: Cefepime 1 g q24h Vanc 1250 mg x 1 F/U renal plans for further dosing (likely CRRT), cx, vanc lvls prn  Height: 4\' 10"  (147.3 cm) Weight: 123 lb 3.8 oz (55.9 kg) IBW/kg (Calculated) : 40.9  Temp (24hrs), Avg:97.6 F (36.4 C), Min:97.6 F (36.4 C), Max:97.6 F (36.4 C)  Recent Labs  Lab 10/31/2018 0533 11/05/2018 0553 10/31/2018 0603 11/09/2018 0821 10/24/2018 1248 10/24/2018 1424  WBC 9.2  --   --   --   --   --   CREATININE 3.36*  --  3.80* 3.25* 3.53* 3.14*  LATICACIDVEN  --  1.9  --   --   --   --     Estimated Creatinine Clearance: 13.2 mL/min (A) (by C-G formula based on SCr of 3.14 mg/dL (H)).    Allergies  Allergen Reactions  . Fluoxetine Hcl     REACTION: UNKNOWN REACTION  . Methocarbamol Nausea And Vomiting  . Metoclopramide Hcl     REACTION: UNKNOWN REACTION  . Motrin [Ibuprofen] Other (See Comments)    REACTION UNKNOWN  . Rabeprazole Sodium     REACTION: UNKNOWN REACTION   Levester Fresh, PharmD, BCPS, BCCCP Clinical Pharmacist [phone]  Please check AMION for all [place] numbers  11/10/2018 10:05 PM

## 2018-10-29 NOTE — ED Triage Notes (Addendum)
EMS called out for a fall. Upon arrival pt friend reported to EMS that pt fell earlier tonight in bathroom and has been weak. Also reports pt falls " a lot". Pt says her friend helps her around the house but does not live with her, she lives alone. Pt reports pain to right wrist from an old fall, denies new injuries, but says she is weak. Pt says she cut the cast off because she got mud on it. Obvious deformity noted to right wrist. Pt appears short of breath, hx of COPD, O2 sats are 96% on room air. Pt says she is not on O2 at home.

## 2018-10-29 NOTE — ED Notes (Addendum)
Kristen Jones, RT reports unable to give albuterol neb until covid test is back and is negative- Dr Betsey Holiday and primary nurse made aware.

## 2018-10-29 NOTE — Progress Notes (Addendum)
Patient arrived to [facility] ICU.  Discussed with Dr Claudie Leach.  BP 112/70 initially Plan to insert foley catheter.  Repeat lab and decide if patient needs dialysis.  Will follow.  12:19 AM The labs are back K 5.7, BUN 118, no urine output, patient remains lethargic, hypotensive requiring levophed. Concern for fluid overload and respiratory distress. Start CRRT, already has dialysis catheter placed. D/w PCCM.

## 2018-10-29 NOTE — ED Notes (Signed)
2 additional  EKG's and rhythm strip obtained due to patient had an elevate pulse rate at 179. Pt currently has a pulse rate of 99.

## 2018-10-29 NOTE — ED Notes (Signed)
Pt denies chest pain- this nurse asked pt if her heart stopped did she want [place] to perform CPR, pt states, " I reckon". Witnesses in room include, Garwin Brothers, RN Quillian Quince, RN, [place], [place]  Dr Betsey Holiday made aware.

## 2018-10-29 NOTE — ED Notes (Signed)
Date and time results received: 11/17/2018 06:05 (use smartphrase ".now" to insert current time)  Test: potassium Critical Value: 7.4  Name of Provider Notified: [place]  Orders Received? Or Actions Taken?: provider notified.

## 2018-10-29 NOTE — ED Notes (Signed)
Kristen Jones, [facility] made aware that we do not have insulin vial in pyxis. Primary nurse aware.

## 2018-10-29 NOTE — H&P (Addendum)
Name: Kristen Jones MRN: [id number] DOB: 1951-03-19 CC: weakness   LOS: 0  [place] / Critical Care Note   History of Present Illness:  68 yo F from [place] ED , brought in by friend , found her down in the floor , unclear amount of time down , she was conscious but feeling weak in the last few days. Has hx of DM, CKD3 , HTN, diastolic and chronic systolic CHF. She has a fall recently and broken right wrist, she took the cask off herself without medical supervision. She was found hyperkalemic with renal failure and hypotension in the outside hospital and transfer to [facility] for possible hemodialysis.  During me evaluation the patient is awake but lethargic , follows some commands , seems to be in resp distress although she is only on 2 L [place] with normal sats. She is unable to answer any questions.  A foley was placed with minimal urine output.  A-line and Trialysis also placed. Very poor access.    Lines / Drains: RIJ trialysis , A-line R femoral 8/6   Cultures: ordered and pending   Antibiotics: cefepime and vancomycin   Tests / Events: COVID19 NEGATIVE    Past Medical History:  Diagnosis Date  . Arthritis   . COPD (chronic obstructive pulmonary disease) ([place])   . Depression with anxiety 02/02/2018  . Diabetes mellitus   . High cholesterol   . Hypertension   . Mental disorder     Past Surgical History:  Procedure Laterality Date  . CHOLECYSTECTOMY    . KNEE SURGERY    . LEFT HEART CATH AND CORONARY ANGIOGRAPHY N/A 02/05/2018   Procedure: LEFT HEART CATH AND CORONARY ANGIOGRAPHY;  Surgeon: Burnell Blanks, MD;  Location: [place] [facility];  Service: Cardiovascular;  Laterality: N/A;  . MULTIPLE EXTRACTIONS WITH ALVEOLOPLASTY  06/10/2011   Procedure: MULTIPLE EXTRACION WITH ALVEOLOPLASTY;  Surgeon: Gae Bon, DDS;  Location: [place];  Service: Oral Surgery;  Laterality: Bilateral;  Extractions of  one,six,eight,nine,eleven,twenty,twenty-one,twenty-two,twenty-four,twenty-five,twenty-six,twenty-seven,twenty-eight,twenty-nine  . OPEN REDUCTION INTERNAL FIXATION (ORIF) DISTAL RADIAL FRACTURE Left 03/12/2018   Procedure: OPEN REDUCTION INTERNAL FIXATION (ORIF) DISTAL RADIAL FRACTURE;  Surgeon: Leanora Cover, MD;  Location: [place];  Service: Orthopedics;  Laterality: Left;  . WRIST FRACTURE SURGERY Left     Prior to Admission medications   Medication Sig Start Date End Date Taking? Authorizing Provider  albuterol (PROVENTIL HFA;VENTOLIN HFA) 108 (90 Base) MCG/ACT inhaler Inhale 1 puff into the lungs every 6 (six) hours as needed for wheezing or shortness of breath.    [provider]  albuterol (PROVENTIL) (2.5 MG/3ML) 0.083% nebulizer solution Take 3 mLs (2.5 mg total) by nebulization every 6 (six) hours as needed for wheezing or shortness of breath. 02/11/18   Fredia Sorrow, MD  albuterol-ipratropium (COMBIVENT) 18-103 MCG/ACT inhaler Inhale 2 puffs into the lungs every 6 (six) hours as needed for wheezing.    [provider]  amLODipine (NORVASC) 10 MG tablet Take 1 tablet (10 mg total) by mouth daily. 02/07/18   Kinnie Feil, MD  aspirin EC 81 MG EC tablet Take 1 tablet (81 mg total) by mouth daily. 02/06/18   Kinnie Feil, MD  carvedilol (COREG) 12.5 MG tablet Take 1 tablet (12.5 mg total) by mouth 2 (two) times daily with a meal. Patient not taking: Reported on 09/21/2018 02/06/18   Kinnie Feil, MD  ezetimibe (ZETIA) 10 MG tablet Take 10 mg by mouth daily.  [provider]  FARXIGA 10 MG TABS tablet Take 10 mg by mouth daily.  02/07/18   [provider]  fenofibrate 160 MG tablet Take 1 tablet (160 mg total) by mouth daily. 02/06/18   Kinnie Feil, MD  furosemide (LASIX) 20 MG tablet Take 20 mg by mouth daily.     [provider]  HYDROcodone-acetaminophen (NORCO) 5-325 MG tablet 1-2 tabs po q6 hours  prn pain Patient not taking: Reported on 09/21/2018 03/12/18   Leanora Cover, MD  labetalol (NORMODYNE) 200 MG tablet Take 200 mg by mouth 2 (two) times daily.    [provider]  lisinopril (PRINIVIL,ZESTRIL) 20 MG tablet Take 20 mg by mouth daily.    [provider]  metFORMIN (GLUCOPHAGE) 500 MG tablet Take by mouth 2 (two) times daily with a meal.    [provider]  naproxen (NAPROSYN) 500 MG tablet Take 500 mg by mouth 2 (two) times daily with a meal.  02/09/18   [provider]  oxyCODONE (OXY IR/ROXICODONE) 5 MG immediate release tablet Take 5 mg by mouth 3 (three) times daily.    [provider]  pantoprazole (PROTONIX) 40 MG tablet Take 40 mg by mouth daily.    [provider]  PARoxetine (PAXIL) 20 MG tablet Take 20 mg by mouth every morning.     [provider]  rosuvastatin (CRESTOR) 10 MG tablet Take 1 tablet (10 mg total) by mouth daily at 6 PM. Patient not taking: Reported on 09/21/2018 02/06/18   Kinnie Feil, MD  Vitamin D, Ergocalciferol, (DRISDOL) 1.25 MG (50000 UT) CAPS capsule Take 1 capsule (50,000 Units total) by mouth every 7 (seven) days. 02/06/18   Kinnie Feil, MD    Allergies Allergies  Allergen Reactions  . Fluoxetine Hcl     REACTION: UNKNOWN REACTION  . Methocarbamol Nausea And Vomiting  . Metoclopramide Hcl     REACTION: UNKNOWN REACTION  . Motrin [Ibuprofen] Other (See Comments)    REACTION UNKNOWN  . Rabeprazole Sodium     REACTION: UNKNOWN REACTION    Family History Family History  Problem Relation Age of Onset  . Diabetes Mother   . Anesthesia problems Neg Hx   . Hypotension Neg Hx   . Malignant hyperthermia Neg Hx   . Pseudochol deficiency Neg Hx     Social History  reports that she has quit smoking. Her smoking use included cigarettes. She has never used smokeless tobacco. She reports that she does not drink alcohol or use drugs.  Review Of Systems:  12 point ROS done  and negative pertinent positives are mentioned in the HPI.   Vital Signs: Temp:  [97.6 F (36.4 C)] 97.6 F (36.4 C) (08/06 0444) Pulse Rate:  [77-104] 101 (08/06 2300) Resp:  [17-36] 24 (08/06 2245) BP: (62-114)/(38-79) 82/52 (08/06 2300) SpO2:  [86 %-100 %] 86 % (08/06 2300) Weight:  [55.9 kg-56.2 kg] 55.9 kg (08/06 1247) I/O last 3 completed shifts: In: 1000 [IV Piggyback:1000] Out: -   Physical Examination: General: lethargic but awake , tachypneic , very dry MM Neuro: moves all ext and follows some commands CV: RRR , no m./r/g PULM: some exp wheezing , no ronchi or crackles GI: BS + soft  nt nd Extremities: +1 pitting edema Skin : multiple skin breakdown in her buttocks , sacrum and heels. DP pulses +1 , cap refill ~ 2 seconds.   Ventilator settings: 2 l [place]    Labs    CBC  Recent Labs  Lab 10/26/2018 0533 11/13/2018 0603 11/10/2018 2229  HGB 9.7* 10.9* 7.9*  HCT 29.6* 32.0* 22.4*  WBC 9.2  --  2.9*  PLT 238  --  144*     BMET Recent Labs  Lab 10/26/2018 0533 11/02/2018 0603 11/14/2018 0821 11/22/2018 1248 11/10/2018 1424  NA 123* 122* 128* 124* 129*  K 7.4* 7.2* 6.4* 7.5* 6.9*  CL 94* 98 100 98 104  CO2 12*  --  14* 12* 11*  GLUCOSE 227* 220* 171* 220* 120*  BUN 126* 123* 122* 132* 120*  CREATININE 3.36* 3.80* 3.25* 3.53* 3.14*  CALCIUM 9.0  --  8.7* 9.0 7.9*  MG  --   --  1.7  --   --   PHOS  --   --   --  6.2* 6.0*    Recent Labs  Lab 11/12/2018 2251  INR 1.4*    Recent Labs  Lab 11/16/2018 0603  TCO2 16*     Radiology: CXR with no infiltrates , small L effusion    Assessment and Plan: Principal Problem:   Acute-on-chronic kidney injury ([place]) Active Problems:   T2DM (type 2 diabetes mellitus) ([place])   Depression with anxiety   Hyperkalemia   Delayed union of distal radius fracture, right, closed   AKI (acute kidney injury) ([place])   Pressure injury of skin   Acute encephalopathy  Neuro - acute toxic encephalopathy secondary to acidosis and  uremia Unclear cause of her fall, tele monitoring Check ammonia CT brain is ordered and pending  CVS- septic shock on low dose levophed for MAP > 65 trops pending Hold BP meds  Lungs - tachypneic secondary to acidosis , lungs clear , metabolic and resp acidosis will place on BIPAP , ABG adequate after BIPAP  Renal - acute on chronic stage 3 CKD renal failure , likely secondary to atn from hypotension and dehydration Lactate normal Uremia causing AMS - start CRRT tonight  ID - septic shock , source likely is skin , Cefepime and vanco , bcx done , pct pending Urinalysis is clean. WBC low and afebrile  Hem - anemia of chronic disease , dilutional. LE U/S to r/o DVT.  Endo - hx of DM2 , NISS  Best practices / Disposition: -->Code Status: full -->DVT Px:hsq -->GI [id number] -->Diet:npo except meds  Critical care time : 45 minutes excluding procedures.    11/12/2018, 11:41 PM

## 2018-10-29 NOTE — ED Notes (Signed)
Pt denies chest pain

## 2018-10-29 NOTE — Procedures (Signed)
Ultrasound guided Arterial line Insertion Procedure Note  Site : Right femoral artery  Indications:  hypotension  Pre-operative Diagnosis: shock  Post-operative Diagnosis: shock  Procedure Details  A time-out was completed verifying correct patient, procedure, site, positioning, and special equipment if applicable. Allen's test was performed to ensure adequate perfusion. The patient's <right groin was prepped and draped in sterile fashion. 1% Lidocaine was used to anesthetize the area. A <18G/ [place] arterial line was introduced into the R femoral> artery. The catheter was threaded over the guide wire and the needle was removed with appropriate pulsatile blood return. The catheter was then sutured in place to the skin and a sterile dressing applied. Perfusion to the extremity distal to the point of catheter insertion was checked and found to be adequate. <The patient tolerated the procedure well and there were no complications.  Estimated Blood Loss:  minimal        Complications:  none         Disposition: icu         Condition: guarded

## 2018-10-30 ENCOUNTER — Inpatient Hospital Stay (HOSPITAL_COMMUNITY): Payer: Medicare Other

## 2018-10-30 DIAGNOSIS — N179 Acute kidney failure, unspecified: Secondary | ICD-10-CM

## 2018-10-30 DIAGNOSIS — E875 Hyperkalemia: Secondary | ICD-10-CM

## 2018-10-30 DIAGNOSIS — G9340 Encephalopathy, unspecified: Secondary | ICD-10-CM

## 2018-10-30 DIAGNOSIS — Z886 Allergy status to analgesic agent status: Secondary | ICD-10-CM

## 2018-10-30 DIAGNOSIS — L89619 Pressure ulcer of right heel, unspecified stage: Secondary | ICD-10-CM

## 2018-10-30 DIAGNOSIS — J449 Chronic obstructive pulmonary disease, unspecified: Secondary | ICD-10-CM

## 2018-10-30 DIAGNOSIS — J9600 Acute respiratory failure, unspecified whether with hypoxia or hypercapnia: Secondary | ICD-10-CM

## 2018-10-30 DIAGNOSIS — L89309 Pressure ulcer of unspecified buttock, unspecified stage: Secondary | ICD-10-CM

## 2018-10-30 DIAGNOSIS — R7881 Bacteremia: Secondary | ICD-10-CM

## 2018-10-30 DIAGNOSIS — N171 Acute kidney failure with acute cortical necrosis: Secondary | ICD-10-CM

## 2018-10-30 DIAGNOSIS — N189 Chronic kidney disease, unspecified: Secondary | ICD-10-CM

## 2018-10-30 DIAGNOSIS — Z888 Allergy status to other drugs, medicaments and biological substances status: Secondary | ICD-10-CM

## 2018-10-30 DIAGNOSIS — L89629 Pressure ulcer of left heel, unspecified stage: Secondary | ICD-10-CM

## 2018-10-30 DIAGNOSIS — B9562 Methicillin resistant Staphylococcus aureus infection as the cause of diseases classified elsewhere: Secondary | ICD-10-CM

## 2018-10-30 DIAGNOSIS — E1122 Type 2 diabetes mellitus with diabetic chronic kidney disease: Secondary | ICD-10-CM

## 2018-10-30 DIAGNOSIS — Z87891 Personal history of nicotine dependence: Secondary | ICD-10-CM

## 2018-10-30 DIAGNOSIS — J9690 Respiratory failure, unspecified, unspecified whether with hypoxia or hypercapnia: Secondary | ICD-10-CM

## 2018-10-30 DIAGNOSIS — B961 Klebsiella pneumoniae [K. pneumoniae] as the cause of diseases classified elsewhere: Secondary | ICD-10-CM

## 2018-10-30 LAB — POCT I-STAT 7, (LYTES, BLD GAS, ICA,H+H)
Acid-Base Excess: 7 mmol/L — ABNORMAL HIGH (ref 0.0–2.0)
Acid-base deficit: 8 mmol/L — ABNORMAL HIGH (ref 0.0–2.0)
Acid-base deficit: 9 mmol/L — ABNORMAL HIGH (ref 0.0–2.0)
Acid-base deficit: 5 mmol/L — ABNORMAL HIGH (ref 0.0–2.0)
Bicarbonate: 18.9 mmol/L — ABNORMAL LOW (ref 20.0–28.0)
Bicarbonate: 16.3 mmol/L — ABNORMAL LOW (ref 20.0–28.0)
Bicarbonate: 19.6 mmol/L — ABNORMAL LOW (ref 20.0–28.0)
Bicarbonate: 31.2 mmol/L — ABNORMAL HIGH (ref 20.0–28.0)
Calcium, Ion: 1.05 mmol/L — ABNORMAL LOW (ref 1.15–1.40)
Calcium, Ion: 1.02 mmol/L — ABNORMAL LOW (ref 1.15–1.40)
Calcium, Ion: 0.98 mmol/L — ABNORMAL LOW (ref 1.15–1.40)
Calcium, Ion: 0.9 mmol/L — ABNORMAL LOW (ref 1.15–1.40)
HCT: 22 % — ABNORMAL LOW (ref 36.0–46.0)
HCT: 24 % — ABNORMAL LOW (ref 36.0–46.0)
HCT: 23 % — ABNORMAL LOW (ref 36.0–46.0)
HCT: 25 % — ABNORMAL LOW (ref 36.0–46.0)
Hemoglobin: 7.5 g/dL — ABNORMAL LOW (ref 12.0–15.0)
Hemoglobin: 8.2 g/dL — ABNORMAL LOW (ref 12.0–15.0)
Hemoglobin: 7.8 g/dL — ABNORMAL LOW (ref 12.0–15.0)
Hemoglobin: 8.5 g/dL — ABNORMAL LOW (ref 12.0–15.0)
O2 Saturation: 95 %
O2 Saturation: 95 %
O2 Saturation: 98 %
O2 Saturation: 100 %
Patient temperature: 99
Patient temperature: 98.1
Patient temperature: 96.5
Patient temperature: 98.6
Potassium: 5.2 mmol/L — ABNORMAL HIGH (ref 3.5–5.1)
Potassium: 5.5 mmol/L — ABNORMAL HIGH (ref 3.5–5.1)
Potassium: 5.1 mmol/L (ref 3.5–5.1)
Potassium: 4.6 mmol/L (ref 3.5–5.1)
Sodium: 134 mmol/L — ABNORMAL LOW (ref 135–145)
Sodium: 133 mmol/L — ABNORMAL LOW (ref 135–145)
Sodium: 134 mmol/L — ABNORMAL LOW (ref 135–145)
Sodium: 135 mmol/L (ref 135–145)
TCO2: 20 mmol/L — ABNORMAL LOW (ref 22–32)
TCO2: 17 mmol/L — ABNORMAL LOW (ref 22–32)
TCO2: 21 mmol/L — ABNORMAL LOW (ref 22–32)
TCO2: 32 mmol/L (ref 22–32)
pCO2 arterial: 43 mmHg (ref 32.0–48.0)
pCO2 arterial: 32.7 mmHg (ref 32.0–48.0)
pCO2 arterial: 30.1 mmHg — ABNORMAL LOW (ref 32.0–48.0)
pCO2 arterial: 41.4 mmHg (ref 32.0–48.0)
pH, Arterial: 7.253 — ABNORMAL LOW (ref 7.350–7.450)
pH, Arterial: 7.305 — ABNORMAL LOW (ref 7.350–7.450)
pH, Arterial: 7.417 (ref 7.350–7.450)
pH, Arterial: 7.485 — ABNORMAL HIGH (ref 7.350–7.450)
pO2, Arterial: 88 mmHg (ref 83.0–108.0)
pO2, Arterial: 81 mmHg — ABNORMAL LOW (ref 83.0–108.0)
pO2, Arterial: 106 mmHg (ref 83.0–108.0)
pO2, Arterial: 293 mmHg — ABNORMAL HIGH (ref 83.0–108.0)

## 2018-10-30 LAB — RENAL FUNCTION PANEL
Albumin: 1.2 g/dL — ABNORMAL LOW (ref 3.5–5.0)
Albumin: 1.1 g/dL — ABNORMAL LOW (ref 3.5–5.0)
Anion gap: 20 — ABNORMAL HIGH (ref 5–15)
Anion gap: 15 (ref 5–15)
BUN: 94 mg/dL — ABNORMAL HIGH (ref 8–23)
BUN: 49 mg/dL — ABNORMAL HIGH (ref 8–23)
CO2: 11 mmol/L — ABNORMAL LOW (ref 22–32)
CO2: 25 mmol/L (ref 22–32)
Calcium: 6.9 mg/dL — ABNORMAL LOW (ref 8.9–10.3)
Calcium: 6.4 mg/dL — CL (ref 8.9–10.3)
Chloride: 101 mmol/L (ref 98–111)
Chloride: 93 mmol/L — ABNORMAL LOW (ref 98–111)
Creatinine, Ser: 2.49 mg/dL — ABNORMAL HIGH (ref 0.44–1.00)
Creatinine, Ser: 1.53 mg/dL — ABNORMAL HIGH (ref 0.44–1.00)
GFR calc Af Amer: 23 mL/min — ABNORMAL LOW (ref >60)
GFR calc Af Amer: 41 mL/min — ABNORMAL LOW (ref >60)
GFR calc non Af Amer: 20 mL/min — ABNORMAL LOW (ref >60)
GFR calc non Af Amer: 35 mL/min — ABNORMAL LOW (ref >60)
Glucose, Bld: 169 mg/dL — ABNORMAL HIGH (ref 70–99)
Glucose, Bld: 123 mg/dL — ABNORMAL HIGH (ref 70–99)
Phosphorus: 5.6 mg/dL — ABNORMAL HIGH (ref 2.5–4.6)
Phosphorus: 3.9 mg/dL (ref 2.5–4.6)
Potassium: 5.4 mmol/L — ABNORMAL HIGH (ref 3.5–5.1)
Potassium: 4.6 mmol/L (ref 3.5–5.1)
Sodium: 132 mmol/L — ABNORMAL LOW (ref 135–145)
Sodium: 133 mmol/L — ABNORMAL LOW (ref 135–145)

## 2018-10-30 LAB — BLOOD CULTURE ID PANEL (REFLEXED)
Acinetobacter baumannii: NOT DETECTED
Candida albicans: NOT DETECTED
Candida glabrata: NOT DETECTED
Candida krusei: NOT DETECTED
Candida parapsilosis: NOT DETECTED
Candida tropicalis: NOT DETECTED
Carbapenem resistance: NOT DETECTED
Enterobacter cloacae complex: NOT DETECTED
Enterobacteriaceae species: DETECTED — AB
Enterococcus species: NOT DETECTED
Escherichia coli: NOT DETECTED
Haemophilus influenzae: NOT DETECTED
Klebsiella oxytoca: NOT DETECTED
Klebsiella pneumoniae: DETECTED — AB
Listeria monocytogenes: NOT DETECTED
Methicillin resistance: DETECTED — AB
Neisseria meningitidis: NOT DETECTED
Proteus species: NOT DETECTED
Pseudomonas aeruginosa: NOT DETECTED
Serratia marcescens: NOT DETECTED
Staphylococcus aureus (BCID): DETECTED — AB
Staphylococcus species: DETECTED — AB
Streptococcus agalactiae: NOT DETECTED
Streptococcus pneumoniae: NOT DETECTED
Streptococcus pyogenes: NOT DETECTED
Streptococcus species: NOT DETECTED

## 2018-10-30 LAB — CSF CELL COUNT WITH DIFFERENTIAL
RBC Count, CSF: 1 /mm3 — ABNORMAL HIGH
Tube #: 4
WBC, CSF: 0 /mm3 (ref 0–5)

## 2018-10-30 LAB — ECHOCARDIOGRAM COMPLETE
Height: 58 in
Weight: 2091.72 oz

## 2018-10-30 LAB — GLUCOSE, CAPILLARY
Glucose-Capillary: 111 mg/dL — ABNORMAL HIGH (ref 70–99)
Glucose-Capillary: 120 mg/dL — ABNORMAL HIGH (ref 70–99)
Glucose-Capillary: 121 mg/dL — ABNORMAL HIGH (ref 70–99)
Glucose-Capillary: 133 mg/dL — ABNORMAL HIGH (ref 70–99)
Glucose-Capillary: 151 mg/dL — ABNORMAL HIGH (ref 70–99)
Glucose-Capillary: 93 mg/dL (ref 70–99)
Glucose-Capillary: 98 mg/dL (ref 70–99)

## 2018-10-30 LAB — MAGNESIUM
Magnesium: 1.5 mg/dL — ABNORMAL LOW (ref 1.7–2.4)
Magnesium: 2.5 mg/dL — ABNORMAL HIGH (ref 1.7–2.4)

## 2018-10-30 LAB — CBC
HCT: 23.3 % — ABNORMAL LOW (ref 36.0–46.0)
Hemoglobin: 8 g/dL — ABNORMAL LOW (ref 12.0–15.0)
MCH: 27.8 pg (ref 26.0–34.0)
MCHC: 34.3 g/dL (ref 30.0–36.0)
MCV: 80.9 fL (ref 80.0–100.0)
Platelets: 165 10*3/uL (ref 150–400)
RBC: 2.88 MIL/uL — ABNORMAL LOW (ref 3.87–5.11)
RDW: 16.4 % — ABNORMAL HIGH (ref 11.5–15.5)
WBC: 4.5 10*3/uL (ref 4.0–10.5)
nRBC: 0.7 % — ABNORMAL HIGH (ref 0.0–0.2)

## 2018-10-30 LAB — PROCALCITONIN
Procalcitonin: 30.37 ng/mL
Procalcitonin: 22.03 ng/mL

## 2018-10-30 LAB — PHOSPHORUS: Phosphorus: 3.7 mg/dL (ref 2.5–4.6)

## 2018-10-30 LAB — CORTISOL: Cortisol, Plasma: 61.4 ug/dL

## 2018-10-30 LAB — PROTEIN AND GLUCOSE, CSF
Glucose, CSF: 95 mg/dL — ABNORMAL HIGH (ref 40–70)
Total  Protein, CSF: 24 mg/dL (ref 15–45)

## 2018-10-30 MED ORDER — INSULIN ASPART 100 UNIT/ML ~~LOC~~ SOLN
0.0000 [IU] | SUBCUTANEOUS | Status: DC
  Administered 2018-10-31: 5 [IU] via SUBCUTANEOUS
  Administered 2018-10-31 (×3): 3 [IU] via SUBCUTANEOUS
  Administered 2018-10-31 – 2018-11-01 (×3): 5 [IU] via SUBCUTANEOUS
  Administered 2018-11-01 (×2): 3 [IU] via SUBCUTANEOUS
  Administered 2018-11-01 (×2): 8 [IU] via SUBCUTANEOUS
  Administered 2018-11-02: 5 [IU] via SUBCUTANEOUS
  Administered 2018-11-02: 8 [IU] via SUBCUTANEOUS
  Administered 2018-11-02: 5 [IU] via SUBCUTANEOUS
  Administered 2018-11-02: 8 [IU] via SUBCUTANEOUS
  Administered 2018-11-02: 5 [IU] via SUBCUTANEOUS
  Administered 2018-11-02: 16:00:00 11 [IU] via SUBCUTANEOUS
  Administered 2018-11-03: 5 [IU] via SUBCUTANEOUS
  Administered 2018-11-03: 3 [IU] via SUBCUTANEOUS
  Administered 2018-11-03: 08:00:00 2 [IU] via SUBCUTANEOUS
  Administered 2018-11-03: 3 [IU] via SUBCUTANEOUS

## 2018-10-30 MED ORDER — PRISMASOL BGK 0/2.5 32-2.5 MEQ/L REPLACEMENT SOLN
Status: DC
  Filled 2018-10-30: qty 5000

## 2018-10-30 MED ORDER — STERILE WATER FOR INJECTION IV SOLN
INTRAVENOUS | Status: DC
  Administered 2018-10-30 – 2018-10-31 (×8): via INTRAVENOUS_CENTRAL
  Filled 2018-10-30 (×11): qty 150
  Filled 2018-10-30: qty 100
  Filled 2018-10-30 (×2): qty 150

## 2018-10-30 MED ORDER — AMIODARONE LOAD VIA INFUSION
150.0000 mg | Freq: Once | INTRAVENOUS | Status: AC
  Administered 2018-10-30: 150 mg via INTRAVENOUS
  Filled 2018-10-30: qty 83.34

## 2018-10-30 MED ORDER — VANCOMYCIN HCL 500 MG IV SOLR
500.0000 mg | INTRAVENOUS | Status: DC
  Administered 2018-10-31 – 2018-11-02 (×3): 500 mg via INTRAVENOUS
  Filled 2018-10-30 (×3): qty 500

## 2018-10-30 MED ORDER — HEPARIN (PORCINE) IN NACL 2-0.9 UNITS/ML: INTRAMUSCULAR | Status: DC | PRN

## 2018-10-30 MED ORDER — PRISMASOL BGK 0/2.5 32-2.5 MEQ/L REPLACEMENT SOLN
Status: DC
  Filled 2018-10-30 (×2): qty 5000

## 2018-10-30 MED ORDER — PRISMASOL BGK 4/2.5 32-4-2.5 MEQ/L IV SOLN
INTRAVENOUS | Status: DC
  Administered 2018-10-30 – 2018-11-01 (×16): via INTRAVENOUS_CENTRAL
  Filled 2018-10-30 (×30): qty 5000

## 2018-10-30 MED ORDER — GERHARDT'S BUTT CREAM
TOPICAL_CREAM | Freq: Two times a day (BID) | CUTANEOUS | Status: DC
  Administered 2018-10-30 – 2018-11-01 (×5): via TOPICAL
  Administered 2018-11-01: 1 via TOPICAL
  Administered 2018-11-02 – 2018-11-03 (×3): via TOPICAL
  Filled 2018-10-30 (×2): qty 1

## 2018-10-30 MED ORDER — HEPARIN (PORCINE) 2000 UNITS/L FOR CRRT
INTRAVENOUS_CENTRAL | Status: DC | PRN
  Filled 2018-10-30: qty 1000

## 2018-10-30 MED ORDER — BISACODYL 10 MG RE SUPP: 10.0000 mg | Freq: Every day | RECTAL | Status: DC | PRN

## 2018-10-30 MED ORDER — VASOPRESSIN 20 UNIT/ML IV SOLN
0.0400 [IU]/min | INTRAVENOUS | Status: DC
  Administered 2018-10-30: 0.04 [IU]/min via INTRAVENOUS
  Filled 2018-10-30: qty 2

## 2018-10-30 MED ORDER — B COMPLEX-C PO TABS
1.0000 | ORAL_TABLET | Freq: Every day | ORAL | Status: DC
  Administered 2018-10-30 – 2018-11-03 (×5): 1
  Filled 2018-10-30 (×5): qty 1

## 2018-10-30 MED ORDER — AMIODARONE HCL IN DEXTROSE 360-4.14 MG/200ML-% IV SOLN
60.0000 mg/h | INTRAVENOUS | Status: AC
  Administered 2018-10-30: 60 mg/h via INTRAVENOUS
  Filled 2018-10-30: qty 200

## 2018-10-30 MED ORDER — MAGNESIUM SULFATE 4 GM/100ML IV SOLN
4.0000 g | Freq: Once | INTRAVENOUS | Status: AC
  Administered 2018-10-30: 4 g via INTRAVENOUS
  Filled 2018-10-30: qty 100

## 2018-10-30 MED ORDER — FENTANYL CITRATE (PF) 100 MCG/2ML IJ SOLN: 25.0000 ug | INTRAMUSCULAR | Status: DC | PRN

## 2018-10-30 MED ORDER — DIGOXIN 0.25 MG/ML IJ SOLN
0.2500 mg | Freq: Four times a day (QID) | INTRAMUSCULAR | Status: AC
  Administered 2018-10-30 (×2): 0.25 mg via INTRAVENOUS
  Filled 2018-10-30 (×2): qty 2

## 2018-10-30 MED ORDER — AMIODARONE HCL IN DEXTROSE 360-4.14 MG/200ML-% IV SOLN
60.0000 mg/h | INTRAVENOUS | Status: AC
  Administered 2018-10-30 (×2): 60 mg/h via INTRAVENOUS
  Filled 2018-10-30: qty 200

## 2018-10-30 MED ORDER — MIDAZOLAM HCL 2 MG/2ML IJ SOLN
INTRAMUSCULAR | Status: AC
  Administered 2018-10-30: 2 mg
  Filled 2018-10-30: qty 2

## 2018-10-30 MED ORDER — HYDROCORTISONE NA SUCCINATE PF 100 MG IJ SOLR
50.0000 mg | Freq: Four times a day (QID) | INTRAMUSCULAR | Status: DC
  Administered 2018-10-30 – 2018-11-01 (×9): 50 mg via INTRAVENOUS
  Filled 2018-10-30 (×9): qty 2

## 2018-10-30 MED ORDER — VANCOMYCIN HCL 500 MG IV SOLR
500.0000 mg | INTRAVENOUS | Status: DC
  Filled 2018-10-30: qty 500

## 2018-10-30 MED ORDER — FENTANYL CITRATE (PF) 100 MCG/2ML IJ SOLN
INTRAMUSCULAR | Status: AC
  Administered 2018-10-30: 100 ug
  Filled 2018-10-30: qty 2

## 2018-10-30 MED ORDER — PHENYLEPHRINE HCL-NACL 10-0.9 MG/250ML-% IV SOLN
0.0000 ug/min | INTRAVENOUS | Status: DC
  Administered 2018-10-30: 20 ug/min via INTRAVENOUS
  Administered 2018-10-30: 80 ug/min via INTRAVENOUS
  Administered 2018-10-30: 35 ug/min via INTRAVENOUS
  Administered 2018-10-31: 02:00:00 80 ug/min via INTRAVENOUS
  Filled 2018-10-30: qty 250
  Filled 2018-10-30: qty 500
  Filled 2018-10-30: qty 250

## 2018-10-30 MED ORDER — AMIODARONE HCL IN DEXTROSE 360-4.14 MG/200ML-% IV SOLN
30.0000 mg/h | INTRAVENOUS | Status: DC
  Administered 2018-10-30 – 2018-11-02 (×6): 30 mg/h via INTRAVENOUS
  Filled 2018-10-30 (×9): qty 200

## 2018-10-30 MED ORDER — FENTANYL CITRATE (PF) 100 MCG/2ML IJ SOLN
25.0000 ug | INTRAMUSCULAR | Status: DC | PRN
  Administered 2018-11-01: 16:00:00 50 ug via INTRAVENOUS
  Filled 2018-10-30: qty 2

## 2018-10-30 MED ORDER — SODIUM CHLORIDE 0.9 % IV SOLN
2.0000 g | INTRAVENOUS | Status: DC
  Administered 2018-10-31 (×2): 2 g via INTRAVENOUS
  Filled 2018-10-30 (×2): qty 20

## 2018-10-30 MED ORDER — SODIUM CHLORIDE 0.9 % IV SOLN
2.0000 g | Freq: Two times a day (BID) | INTRAVENOUS | Status: DC
  Administered 2018-10-30: 2 g via INTRAVENOUS
  Filled 2018-10-30: qty 2

## 2018-10-30 MED ORDER — AMIODARONE HCL IN DEXTROSE 360-4.14 MG/200ML-% IV SOLN
30.0000 mg/h | INTRAVENOUS | Status: DC
  Filled 2018-10-30: qty 200

## 2018-10-30 MED ORDER — HEPARIN SODIUM (PORCINE) 1000 UNIT/ML DIALYSIS
1000.0000 [IU] | INTRAMUSCULAR | Status: DC | PRN
  Filled 2018-10-30: qty 6

## 2018-10-30 MED ORDER — PRISMASOL BGK 4/2.5 32-4-2.5 MEQ/L REPLACEMENT SOLN
Status: DC
  Administered 2018-10-30 – 2018-11-01 (×6): via INTRAVENOUS_CENTRAL
  Filled 2018-10-30 (×10): qty 5000

## 2018-10-30 MED ORDER — DEXMEDETOMIDINE HCL IN NACL 400 MCG/100ML IV SOLN: 0.4000 ug/kg/h | INTRAVENOUS | Status: DC

## 2018-10-30 MED ORDER — VITAL HIGH PROTEIN PO LIQD
1000.0000 mL | ORAL | Status: DC
  Administered 2018-10-30 – 2018-11-03 (×4): 1000 mL
  Filled 2018-10-30: qty 1000

## 2018-10-30 MED ORDER — PRISMASOL BGK 0/2.5 32-2.5 MEQ/L REPLACEMENT SOLN
Status: DC
  Administered 2018-10-30: 03:00:00 via INTRAVENOUS_CENTRAL
  Filled 2018-10-30 (×2): qty 5000

## 2018-10-30 MED ORDER — DOCUSATE SODIUM 50 MG/5ML PO LIQD: 100.0000 mg | Freq: Two times a day (BID) | ORAL | Status: DC | PRN

## 2018-10-30 NOTE — Procedures (Signed)
Patient Name: Kristen Jones  MRN: [id number]  Epilepsy Attending: Lora Havens  Referring Physician/Provider: Dr Ina Homes Date: 10/30/2018 Duration: 26.17 mins  Patient history: 68yo F with ams. EEG to evaluate for seizures  Level of alertness: comatose  Technical aspects: This EEG study was done with scalp electrodes positioned according to the 10-20 International system of electrode placement. Electrical activity was acquired at a sampling rate of 500Hz  and reviewed with a high frequency filter of 70Hz  and a low frequency filter of 1Hz . EEG data were recorded continuously and digitally stored.   DESCRIPTION: Continuous generalized rhythmic 3-5hz  theta-delta slowing, at times with triphasic morphology was seen. EEG was reactive to tactile stimulation. Hyperventilation and photic stimulation were not performed.  IMPRESSION: This study is suggestive of severe diffuse encephalopathy. No seizures or clear epileptiform discharges were seen throughout the recording.

## 2018-10-30 NOTE — Progress Notes (Signed)
Bedside EEG completed, results pending. 

## 2018-10-30 NOTE — Progress Notes (Signed)
PHARMACY - PHYSICIAN COMMUNICATION CRITICAL VALUE ALERT - BLOOD CULTURE IDENTIFICATION (BCID)  Kristen Jones is an 68 y.o. female who presented to [facility] on 11/17/2018 with a chief complaint of fall.  Assessment:  1/2 blood cultures growing MRSA and klebsiella pneumoniae.   Name of physician (or Provider) Contacted: CCM Pharmacist and Dr. Tommy Medal  Current antibiotics:  - cefepime 2 gram every 12 hours  - vancomycin 500 mg every 24 hours   Changes to prescribed antibiotics recommended:  Discontinue cefepime  Start ceftriaxone 2 gram every 24 hours  Continue vancomycin 500 mg every 24 hours   Recommendations accepted by provider  Results for orders placed or performed during the hospital encounter of 11/18/2018  Blood Culture ID Panel (Reflexed) (Collected: 10/27/2018 10:50 PM)  Result Value Ref Range   Enterococcus species NOT DETECTED NOT DETECTED   Listeria monocytogenes NOT DETECTED NOT DETECTED   Staphylococcus species DETECTED (A) NOT DETECTED   Staphylococcus aureus (BCID) DETECTED (A) NOT DETECTED   Methicillin resistance DETECTED (A) NOT DETECTED   Streptococcus species NOT DETECTED NOT DETECTED   Streptococcus agalactiae NOT DETECTED NOT DETECTED   Streptococcus pneumoniae NOT DETECTED NOT DETECTED   Streptococcus pyogenes NOT DETECTED NOT DETECTED   Acinetobacter baumannii NOT DETECTED NOT DETECTED   Enterobacteriaceae species DETECTED (A) NOT DETECTED   Enterobacter cloacae complex NOT DETECTED NOT DETECTED   Escherichia coli NOT DETECTED NOT DETECTED   Klebsiella oxytoca NOT DETECTED NOT DETECTED   Klebsiella pneumoniae DETECTED (A) NOT DETECTED   Proteus species NOT DETECTED NOT DETECTED   Serratia marcescens NOT DETECTED NOT DETECTED   Carbapenem resistance NOT DETECTED NOT DETECTED   Haemophilus influenzae NOT DETECTED NOT DETECTED   Neisseria meningitidis NOT DETECTED NOT DETECTED   Pseudomonas aeruginosa NOT DETECTED NOT DETECTED   Candida albicans NOT  DETECTED NOT DETECTED   Candida glabrata NOT DETECTED NOT DETECTED   Candida krusei NOT DETECTED NOT DETECTED   Candida parapsilosis NOT DETECTED NOT DETECTED   Candida tropicalis NOT DETECTED NOT DETECTED    Thank you,   Eddie Candle, PharmD PGY-1 Pharmacy Resident  Phone: [phone] 10/30/2018  12:36 PM

## 2018-10-30 NOTE — Progress Notes (Signed)
PT Discharge Note  Patient Details Name: Kristen Jones MRN: [id number] DOB: October 27, 1950   Cancelled Treatment:    Reason Eval/Treat Not Completed: Patient not medically ready    PT is signing off. Pt admitted to ICU since order written. Noted on pressors and at risk for intubation.   Please re-order PT if/when appropriate.    Barry Brunner, PT      [name] 10/30/2018, 8:30 AM

## 2018-10-30 NOTE — Progress Notes (Signed)
CRITICAL VALUE ALERT  Critical Value: Calcium 6.4  Date & Time Notied:  10/30/18 1804  Provider Notified: Dr. Charlsie Quest  Orders Received/Actions taken: No new orders at this time

## 2018-10-30 NOTE — Progress Notes (Signed)
 Initial Nutrition Assessment   RD working remotely.   DOCUMENTATION CODES:   Not applicable  INTERVENTION:   Tube Feeding:  Vital High Protein at 50 ml/hr Provides 1200 kcals, 106 g of protein and 1008 mL pf free water  Add B-complex with C; changed to Rena-Vit once able to take po   NUTRITION DIAGNOSIS:   Inadequate oral intake related to acute illness as evidenced by NPO status.  GOAL:   Patient will meet greater than or equal to 90% of their needs  MONITOR:   TF tolerance, Vent status, Labs, Weight trends, Skin  REASON FOR ASSESSMENT:   Ventilator    ASSESSMENT:   68 yo female admitted after being found on the floor by friend (unclear amount of down time, conscious but week) with AKI with severe hyperkalemia, acute toxic encephalopathy, septic shock. Pt with decline in status requiring initiation of CRRT and intubation. PMH includes HTN, DM, HLD, COPD, depression, CKD III, CHF  Patient is currently intubated on ventilator support, no sedation, on vasopressin, phenylephrine (pressor requirements trending down) MV: 6.7 L/min Temp (24hrs), Avg:96.8 F (36 C), Min:95.9 F (35.5 C), Max:98.1 F (36.7 C)  Unable to obtain diet and weight history from pt at this time  Per chest xray, OG tube projecting over stomach  Admission wt 56 kg; current wt 59.3 kg. No significant weight loss over the past year per weight encounters  Noted pt with multiple PI present on admission; pt evaluated by WOC RN  Labs: sodium 134, potassium 5.1 (wdl), phosphorus 5.6 Meds: thiamine   NUTRITION - FOCUSED PHYSICAL EXAM:  Unable to assess  Diet Order:   Diet Order            Diet NPO time specified  Diet effective now              EDUCATION NEEDS:   Not appropriate for education at this time  Skin:  Skin Assessment: Skin Integrity Issues: Skin Integrity Issues:: DTI DTI: buttocks, bilateral heels  Last BM:  8/6  Height:   Ht Readings from Last 1 Encounters:   11/07/2018 4\' 10"  (1.473 m)    Weight:   Wt Readings from Last 1 Encounters:  10/30/18 59.3 kg    BMI:  Body mass index is 27.32 kg/m.  Estimated Nutritional Needs:   Kcal:  1115 kcals  Protein:  100-120 g  Fluid:  >/= 1.5 L     Marcas Bowsher MS, RDN, LDN, CNSC [phone] Pager  [phone] Weekend/On-Call Pager

## 2018-10-30 NOTE — Consult Note (Signed)
[facility] Nurse wound consult note Patient found "down" at home unresponsive and now has Deep tissue injury to buttocks and generalized edema with blistering to right gluteal fold and ischial area.   Bilateral heels with deep tissue injury Moisture associated skin damage to labia and inguinal folds.  Was anuric on admission due to AKI. Reason for Consult:Deep tissue injury to buttocks and heels and blistering due to edema Wound type:pressure and edema Pressure Injury POA: Yes Measurement: Buttocks 4 cm x 4 cm deep maroon discoloration Right ischium with blistering and peeling epithelium  Silicone foam applied and removal seems to worsen epithelial sloughing.  Will leave this intact for 3 days and begin other treatments then. MASD to labia and inguinal folds.  Deep tissue injury to bilateral heels. 2 cm x 2 cm intact Wound [id number] moist tissue with sloughing epithelium beneath blisters.  Drainage (amount, consistency, odor) moderate serosanguinous to blistering Periwound:intact   Dressing procedure/placement/frequency: Cleanse buttocks and inguinal folds with soap and water.  Gerhardts butt paste to protect twice daily and PRN soilage.  LEave open to air.  Silicone foam to blisters.  Leave in place for three days to avoid further peeling and trauma. Will not follow at this time.  Please re-consult if needed.  Domenic Moras MSN, RN, FNP-BC CWON Wound, Ostomy, Continence Nurse Pager [phone]

## 2018-10-30 NOTE — Progress Notes (Signed)
Pharmacy Antibiotic Note  Kristen Jones is a 68 y.o. female admitted on 11/11/2018 with sepsis, weakness, PNA r/o, wound infection?  Plan to start CRRT tonight  Plan: Change Cefepime 2gm q12 Vancomycin 500 mg IV q24  Height: 4\' 10"  (147.3 cm) Weight: 123 lb 3.8 oz (55.9 kg) IBW/kg (Calculated) : 40.9  Temp (24hrs), Avg:97.9 F (36.6 C), Min:97.6 F (36.4 C), Max:98.1 F (36.7 C)  Recent Labs  Lab 11/16/2018 0533 11/07/2018 0553 10/28/2018 0603 11/22/2018 0821 10/25/2018 1248 11/16/2018 1424 11/10/2018 2229  WBC 9.2  --   --   --   --   --  2.9*  CREATININE 3.36*  --  3.80* 3.25* 3.53* 3.14* 3.08*  LATICACIDVEN  --  1.9  --   --   --   --  1.9    Estimated Creatinine Clearance: 13.5 mL/min (A) (by C-G formula based on SCr of 3.08 mg/dL (H)).    Allergies  Allergen Reactions  . Fluoxetine Hcl     REACTION: UNKNOWN REACTION  . Methocarbamol Nausea And Vomiting  . Metoclopramide Hcl     REACTION: UNKNOWN REACTION  . Motrin [Ibuprofen] Other (See Comments)    REACTION UNKNOWN  . Rabeprazole Sodium     REACTION: UNKNOWN REACTION   Thanks for allowing pharmacy to be a part of this patient's care.  Excell Seltzer, PharmD Clinical Pharmacist

## 2018-10-30 NOTE — Progress Notes (Signed)
  Echocardiogram 2D Echocardiogram has been performed.  Kristen Jones 10/30/2018, 3:40 PM

## 2018-10-30 NOTE — Progress Notes (Signed)
Seen and examined.  Septic shock, ATN, severe acidosis, afib RVR. On exam, lethargic on BIPAP.  Ext warm.  MV 32.  Today will do following changes: - Switch from levophed to vasopressin+neo to try to reduce beta adrenergic stimulation - Stop methylprednisolone and add hydrocortisone stress dosing - Digoxin loading, 4g mag - ABG in 2 hours, if still acidotic will add bicarb drip - If does not convert later today will start heparin drip - Low threshold for intubation

## 2018-10-30 NOTE — Progress Notes (Signed)
Bilateral lower extremity venous duplex completed. Refer to "CV Proc" under chart review to view preliminary results.  10/30/2018 11:16 AM Maudry Mayhew, MHA, RVT, RDCS, RDMS

## 2018-10-30 NOTE — Procedures (Addendum)
Intubation Procedure Note Kristen Jones [id number] November 22, 1950  Procedure: Intubation Indications: Airway protection and maintenance  Procedure Details Consent: Unable to obtain consent due to medical emergency and unable to reach family in time. Time Out: Verified patient identification, verified procedure, site/side was marked, verified correct patient position, special equipment/implants available, medications/allergies/relevent history reviewed, required imaging and test results available.  Performed  MAC and 3 A size 7.5 ET tube was placed followed by the placement of a OG tube.  Versed 38m Fentanyl 583m Did not use a paralytic   Evaluation Hemodynamic Status: BP stable throughout; O2 sats: stable throughout Patient's Current Condition: stable Complications: No apparent complications Patient did tolerate procedure well. Chest X-ray ordered to verify placement.  CXR: pending.  LaKathi LudwigMD CoBayside Ambulatory Center LLCnternal Medicine, PGY-3

## 2018-10-30 NOTE — Procedures (Signed)
Lumbar Puncture Procedure Note  Pre-operative Diagnosis: AMS, bacteremia, sepsis  Post-operative Diagnosis: AMS, bacteremia, sepsis  Indications: Diagnostic  Procedure Details   Consent: Informed consent was obtained by two physician consent as we cannot find family.   The patient was positioned under sterile conditions. Betadine solution and sterile drapes were utilized. A spinal needle was inserted at the L3 - L4 interspace.  Spinal fluid was obtained and sent to the laboratory.  Findings 78mL of clear spinal fluid was obtained. Opening Pressure: 16 cm H2O pressure. Closing Pressure: 14 cm H2O pressure.  Complications:  None; patient tolerated the procedure well.        Condition: stable

## 2018-10-30 NOTE — Consult Note (Addendum)
Date of Admission:  10/28/2018          Reason for Consult: MRSA and klebsiella PNA bacteremia   Referring Provider: [facility] consult   Assessment:  1. MRSA and klebsiella bacteremia of unclear source with  2. Severe sepsis and septic shock 3. ARF on CKD with pt on CRRT 4. Resp failuire on BIPAP 5. Multiple pressure ulcers 6. Encephalopathy ? Anoxic brain injury  Plan:  1. Continue vancomycin and add ceftriaxone for gram-negative coverage 2. Hopefully she will stabilize and not require all of the pressor support 3. It is difficult if not impossible to assess for sources of the infections beyond her soft tissue injuries which could have also occurred secondary to her being down, and her current state. 4.  Certainly if she is to survive this infection she would eventually need a "central line holiday but clearly that not possible at this point in time  Dr. Baxter Flattery will check in on her over the weekend.  Principal Problem:   Acute-on-chronic kidney injury ([place]) Active Problems:   T2DM (type 2 diabetes mellitus) ([place])   Depression with anxiety   Hyperkalemia   Delayed union of distal radius fracture, right, closed   AKI (acute kidney injury) ([place])   Pressure injury of skin   Acute encephalopathy   MRSA bacteremia   Bacteremia due to Klebsiella pneumoniae   Scheduled Meds: . aspirin  81 mg Oral Daily  . chlorhexidine  15 mL Mouth Rinse BID  . Chlorhexidine Gluconate Cloth  6 each Topical Q0600  . Gerhardt's butt cream   Topical BID  . heparin  5,000 Units Subcutaneous Q8H  . hydrocortisone sod succinate (SOLU-CORTEF) inj  50 mg Intravenous Q6H  . insulin aspart  0-15 Units Subcutaneous TID WC  . insulin aspart  0-5 Units Subcutaneous QHS  . ipratropium-albuterol  3 mL Nebulization Q6H  . mouth rinse  15 mL Mouth Rinse q12n4p  . mupirocin ointment  1 application Nasal BID  . PARoxetine  20 mg Oral BH-q7a  . sodium chloride flush  3 mL Intravenous Q12H  . thiamine  injection  100 mg Intravenous Daily   Continuous Infusions: .  prismasol BGK 4/2.5 500 mL/hr at 10/30/18 0321  . sodium chloride Stopped (10/30/18 0822)  . amiodarone 30 mg/hr (10/30/18 1136)  . ceFEPime (MAXIPIME) IV Stopped (10/30/18 1214)  . famotidine (PEPCID) IV Stopped (10/30/18 0207)  . heparin    . norepinephrine (LEVOPHED) Adult infusion Stopped (10/30/18 0802)  . phenylephrine (NEO-SYNEPHRINE) Adult infusion 15 mcg/min (10/30/18 1300)  . prismasol BGK 4/2.5 1,500 mL/hr at 10/30/18 1044  . sodium bicarbonate (isotonic) 1000 mL infusion 400 mL/hr at 10/30/18 0940  . [START ON 10/31/2018] vancomycin    . vasopressin (PITRESSIN) infusion - *FOR SHOCK* 0.04 Units/min (10/30/18 1300)   PRN Meds:.sodium chloride, acetaminophen **OR** acetaminophen, albuterol, albuterol, heparin, heparin, heparin, ipratropium-albuterol, ondansetron **OR** ondansetron (ZOFRAN) IV, ondansetron (ZOFRAN) IV, polyethylene glycol, sodium chloride flush  HPI: LOUNETTE RAIMONDO is a 68 y.o. female With diabetes mellitus and chronic kidney disease, COPD who was Apparently found down on the floor of her house by a neighbor.  She has been conscious but feeling weak for the days prior to her being found unresponsive on the floor.  She had a fall recently and sustained a broken wrist.  Upon arrival in ER she was found to be hyperkalemic with acute renal failure and septic physiology.  She was transferred from any [facility] for  need for hemodialysis.  She is currently on CRRT and multiple pressors.  Blood cultures are yielding methicillin-resistant Staphylococcus aureus in the Klebsiella pneumonia species.  She is also on BiPAP and unresponsive at present.  She has some deep tissue injuries on her buttocks and her heels that have been examined by wound care.  Hopefully she will improve but at first glance her prognosis appears fairly grim..  I have ordered repeat blood cultures and I am adding ceftriaxone  to cover for her Klebsiella.  Ultimately she would need a line holiday but clearly is not possible at present    Review of Systems: Review of Systems  Unable to perform ROS: Critical illness    Past Medical History:  Diagnosis Date  . Arthritis   . COPD (chronic obstructive pulmonary disease) ([place])   . Depression with anxiety 02/02/2018  . Diabetes mellitus   . High cholesterol   . Hypertension   . Mental disorder     Social History   Tobacco Use  . Smoking status: Former Smoker    Types: Cigarettes  . Smokeless tobacco: Never Used  Substance Use Topics  . Alcohol use: No  . Drug use: No    Family History  Problem Relation Age of Onset  . Diabetes Mother   . Anesthesia problems Neg Hx   . Hypotension Neg Hx   . Malignant hyperthermia Neg Hx   . Pseudochol deficiency Neg Hx    Allergies  Allergen Reactions  . Fluoxetine Hcl     REACTION: UNKNOWN REACTION  . Methocarbamol Nausea And Vomiting  . Metoclopramide Hcl     REACTION: UNKNOWN REACTION  . Motrin [Ibuprofen] Other (See Comments)    REACTION UNKNOWN  . Rabeprazole Sodium     REACTION: UNKNOWN REACTION    OBJECTIVE: Blood pressure 112/77, pulse (!) 109, temperature (!) 95.9 F (35.5 C), temperature source Axillary, resp. rate (!) 27, height 4\' 10"  (1.473 m), weight 59.3 kg, SpO2 100 %.  Physical Exam Constitutional:      Appearance: She is obese. She is ill-appearing.  HENT:     Head: Normocephalic and atraumatic.     Right Ear: External ear normal.     Left Ear: External ear normal.     Nose: Nose normal.  Eyes:     General: No scleral icterus. Cardiovascular:     Rate and Rhythm: Tachycardia present.     Heart sounds: No murmur. No friction rub. No gallop.   Pulmonary:     Effort: Respiratory distress present.     Breath sounds: No stridor.  Chest:     Chest wall: No tenderness.  Abdominal:     General: There is no distension.     Palpations: Abdomen is soft. There is no mass.  Skin:     Coloration: Skin is pale.  Neurological:     General: No focal deficit present.     Lab Results Lab Results  Component Value Date   WBC 4.5 10/30/2018   HGB 7.8 (L) 10/30/2018   HCT 23.0 (L) 10/30/2018   MCV 80.9 10/30/2018   PLT 165 10/30/2018    Lab Results  Component Value Date   CREATININE 2.49 (H) 10/30/2018   BUN 94 (H) 10/30/2018   NA 134 (L) 10/30/2018   K 5.1 10/30/2018   CL 101 10/30/2018   CO2 11 (L) 10/30/2018    Lab Results  Component Value Date   ALT 65 (H) 11/02/2018   AST 100 (H) 11/13/2018  ALKPHOS 108 10/28/2018   BILITOT 2.4 (H) 11/23/2018     Microbiology: Recent Results (from the past 240 hour(s))  SARS Coronavirus 2 [facility] order, Performed in [facility] hospital lab) Nasopharyngeal Nasopharyngeal Swab     Status: None   Collection Time: 11/16/2018  5:55 AM   Specimen: Nasopharyngeal Swab  Result Value Ref Range Status   SARS Coronavirus 2 NEGATIVE NEGATIVE Final    Comment: (NOTE) If result is NEGATIVE SARS-CoV-2 target nucleic acids are NOT DETECTED. The SARS-CoV-2 RNA is generally detectable in upper and lower  respiratory specimens during the acute phase of infection. The lowest  concentration of SARS-CoV-2 viral copies this assay can detect is 250  copies / mL. A negative result does not preclude SARS-CoV-2 infection  and should not be used as the sole basis for treatment or other  patient management decisions.  A negative result may occur with  improper specimen collection / handling, submission of specimen other  than nasopharyngeal swab, presence of viral mutation(s) within the  areas targeted by this assay, and inadequate number of viral copies  (<250 copies / mL). A negative result must be combined with clinical  observations, patient history, and epidemiological information. If result is POSITIVE SARS-CoV-2 target nucleic acids are DETECTED. The SARS-CoV-2 RNA is generally detectable in upper and lower  respiratory  specimens dur ing the acute phase of infection.  Positive  results are indicative of active infection with SARS-CoV-2.  Clinical  correlation with patient history and other diagnostic information is  necessary to determine patient infection status.  Positive results do  not rule out bacterial infection or co-infection with other viruses. If result is PRESUMPTIVE POSTIVE SARS-CoV-2 nucleic acids MAY BE PRESENT.   A presumptive positive result was obtained on the submitted specimen  and confirmed on repeat testing.  While 2019 novel coronavirus  (SARS-CoV-2) nucleic acids may be present in the submitted sample  additional confirmatory testing may be necessary for epidemiological  and / or clinical management purposes  to differentiate between  SARS-CoV-2 and other Sarbecovirus currently known to infect humans.  If clinically indicated additional testing with an alternate test  methodology [phone]) is advised. The SARS-CoV-2 RNA is generally  detectable in upper and lower respiratory sp ecimens during the acute  phase of infection. The expected result is Negative. Fact Sheet for Patients:  StrictlyIdeas.no Fact Sheet for Healthcare Providers: BankingDealers.co.za This test is not yet approved or cleared by the [place] FDA and has been authorized for detection and/or diagnosis of SARS-CoV-2 by FDA under an Emergency Use Authorization (EUA).  This EUA will remain in effect (meaning this test can be used) for the duration of the COVID-19 declaration under Section 564(b)(1) of the Act, 21 U.S.C. section 360bbb-3(b)(1), unless the authorization is terminated or revoked sooner. Performed at [facility], [address] [place], [place] [address]   MRSA PCR Screening     Status: Abnormal   Collection Time: 11/14/2018 12:54 PM   Specimen: Nasopharyngeal  Result Value Ref Range Status   MRSA by PCR POSITIVE (A) NEGATIVE Final    Comment:         The GeneXpert MRSA Assay (FDA approved for NASAL specimens only), is one component of a comprehensive MRSA colonization surveillance program. It is not intended to diagnose MRSA infection nor to guide or monitor treatment for MRSA infections. RESULT CALLED TO, READ BACK BY AND VERIFIED WITH: PROCTER,R ON 10/28/2018 AT 2305 BY LOY,C Performed at [facility], [place]  [address], [place], [place] [address]   Culture, blood (routine x 2)     Status: None (Preliminary result)   Collection Time: 10/27/2018 10:50 PM   Specimen: BLOOD  Result Value Ref Range Status   Specimen Description BLOOD RIGHT IJ  Final   Special Requests   Final    BOTTLES DRAWN AEROBIC AND ANAEROBIC Blood Culture adequate volume   Culture  Setup Time   Final    GRAM POSITIVE COCCI IN CLUSTERS IN BOTH AEROBIC AND ANAEROBIC BOTTLES GRAM NEGATIVE RODS AEROBIC BOTTLE ONLY CRITICAL RESULT CALLED TO, READ BACK BY AND VERIFIED WITH: A LIPPUCCI,PHARMD AT 1232 10/30/2018 BY L BENFIELD Performed at [facility], [place] [address] [place], [place] [address]    Culture GRAM POSITIVE COCCI GRAM NEGATIVE RODS   Final   Report Status PENDING  Incomplete  Blood Culture ID Panel (Reflexed)     Status: Abnormal   Collection Time: 11/05/2018 10:50 PM  Result Value Ref Range Status   Enterococcus species NOT DETECTED NOT DETECTED Final   Listeria monocytogenes NOT DETECTED NOT DETECTED Final   Staphylococcus species DETECTED (A) NOT DETECTED Final    Comment: CRITICAL RESULT CALLED TO, READ BACK BY AND VERIFIED WITH: A LIPPUCCI,PHARMD AT 1232 10/30/2018 BY L BENFIELD    Staphylococcus aureus (BCID) DETECTED (A) NOT DETECTED Final    Comment: Methicillin (oxacillin)-resistant Staphylococcus aureus (MRSA). MRSA is predictably resistant to beta-lactam antibiotics (except ceftaroline). Preferred therapy is vancomycin unless clinically contraindicated. Patient requires contact precautions if  hospitalized. CRITICAL RESULT CALLED TO, READ  BACK BY AND VERIFIED WITH: A LIPPUCCI,PHARMD AT 1232 10/30/2018 BY L BENFIELD    Methicillin resistance DETECTED (A) NOT DETECTED Final    Comment: CRITICAL RESULT CALLED TO, READ BACK BY AND VERIFIED WITH: A LIPPUCCI,PHARMD AT 1232 10/30/2018 BY L BENFIELD    Streptococcus species NOT DETECTED NOT DETECTED Final   Streptococcus agalactiae NOT DETECTED NOT DETECTED Final   Streptococcus pneumoniae NOT DETECTED NOT DETECTED Final   Streptococcus pyogenes NOT DETECTED NOT DETECTED Final   Acinetobacter baumannii NOT DETECTED NOT DETECTED Final   Enterobacteriaceae species DETECTED (A) NOT DETECTED Final    Comment: Enterobacteriaceae represent a large family of gram-negative bacteria, not a single organism. CRITICAL RESULT CALLED TO, READ BACK BY AND VERIFIED WITH: A LIPPUCCI,PHARMD AT 1232 10/30/2018 BY L BENFIELD    Enterobacter cloacae complex NOT DETECTED NOT DETECTED Final   Escherichia coli NOT DETECTED NOT DETECTED Final   Klebsiella oxytoca NOT DETECTED NOT DETECTED Final   Klebsiella pneumoniae DETECTED (A) NOT DETECTED Final    Comment: CRITICAL RESULT CALLED TO, READ BACK BY AND VERIFIED WITH: A LIPPUCCI,PHARMD AT 1232 10/30/2018 BY L BENFIELD    Proteus species NOT DETECTED NOT DETECTED Final   Serratia marcescens NOT DETECTED NOT DETECTED Final   Carbapenem resistance NOT DETECTED NOT DETECTED Final   Haemophilus influenzae NOT DETECTED NOT DETECTED Final   Neisseria meningitidis NOT DETECTED NOT DETECTED Final   Pseudomonas aeruginosa NOT DETECTED NOT DETECTED Final   Candida albicans NOT DETECTED NOT DETECTED Final   Candida glabrata NOT DETECTED NOT DETECTED Final   Candida krusei NOT DETECTED NOT DETECTED Final   Candida parapsilosis NOT DETECTED NOT DETECTED Final   Candida tropicalis NOT DETECTED NOT DETECTED Final    Comment: Performed at [facility], [place]. [address], [place], [place] [address]  Culture, blood (routine x 2)     Status: None (Preliminary result)    Collection Time: 11/15/2018 11:15 PM  Specimen: BLOOD  Result Value Ref Range Status   Specimen Description BLOOD RIGHT FEM LINE  Final   Special Requests   Final    BOTTLES DRAWN AEROBIC AND ANAEROBIC Blood Culture adequate volume   Culture  Setup Time   Final    GRAM POSITIVE COCCI ANAEROBIC BOTTLE ONLY CRITICAL VALUE NOTED.  VALUE IS CONSISTENT WITH PREVIOUSLY REPORTED AND CALLED VALUE. Performed at [facility], [place] [address], [place], [place] [address]    Culture [facility] POSITIVE COCCI  Final   Report Status PENDING  Incomplete    Alcide Evener, [place] [facility] [phone] pager  10/30/2018, 1:24 PM

## 2018-10-30 NOTE — Progress Notes (Signed)
Subjective:  Chart reviewed - briefly 68 year old with HTN, DM, COPD- stage 3 CKD with baseline crt 1.2-1.5.  Brought to medical attn for weakness, falls- noted to be hypotensive, in AKI with hyperkalemia - on farxiga, lasix, zestril, metformin and naprosyn.  She deteriorated at [facility], transferred here and required initiation of CRRT at around midnight - is running well-   Objective Vital signs in last 24 hours: Vitals:   10/30/18 0634 10/30/18 0645 10/30/18 0723 10/30/18 0730  BP: (!) 83/62  (!) 106/49   Pulse: (!) 111 (!) 121 (!) 121   Resp: (!) 29 (!) 27 (!) 26   Temp:    (!) 96.5 F (35.8 C)  TempSrc:    Axillary  SpO2: 97% 97% 100%   Weight:      Height:       Weight change: -0.3 kg  Intake/Output Summary (Last 24 hours) at 10/30/2018 0841 Last data filed at 10/30/2018 0800 Gross per 24 hour  Intake 6665.72 ml  Output 668 ml  Net 5997.72 ml    Assessment/ Plan: Pt is a 68 y.o. yo female with DM, HTN, who was admitted on 11/07/2018 with hypotension and AKI with hyperkalemia   Assessment/Plan: 1. Renal- last known crt 1.2 in December of 2019.  Now presenting with AKI in the setting of hypotension- maybe sepsis and many possible offending meds- farxiga, metformin, ace and NSAIDS on hold.  Req CRRT overnight - did make 125 of urine.  Cont to support with CRRT but watch for signs of renal recovery   2. Hyperkalemia-  Improved with CRRT - is on zero K post filter.  Given residual metabolic acidosis will give just bicarb post filter.  Other fluids 4 K   3.  Metabolic acidosis-  Will order bicarb post filter - metformin held 4. Anemia- due to situation and AKI-  Supportive care for now  5. HTN/volume- does have some pitting edema-  Trying to keep even with CRRT- albumin 1.2 - must have been very chronically ill at baseline - not a good predictor   Louis Meckel    Labs: Basic Metabolic Panel: Recent Labs  Lab 10/30/2018 1424 10/28/2018 2229 11/13/2018 2318 10/30/18 0126  10/30/18 0524  NA 129* 131* 134* 133* 132*  K 6.9* 5.7* 5.2* 5.5* 5.4*  CL 104 99  --   --  101  CO2 11* 18*  --   --  11*  GLUCOSE 120* 115*  --   --  169*  BUN 120* 118*  --   --  94*  CREATININE 3.14* 3.08*  --   --  2.49*  CALCIUM 7.9* 7.4*  --   --  6.9*  PHOS 6.0* 5.9*  --   --  5.6*   Liver Function Tests: Recent Labs  Lab 10/25/2018 0533  10/28/2018 1424 11/11/2018 2229 10/30/18 0524  AST 78*  --   --  100*  --   ALT 79*  --   --  65*  --   ALKPHOS 126  --   --  108  --   BILITOT 3.4*  --   --  2.4*  --   PROT 6.5  --   --  4.3*  --   ALBUMIN 2.2*   < > 1.7* 1.3* 1.2*   < > = values in this interval not displayed.   No results for input(s): LIPASE, AMYLASE in the last 168 hours. Recent Labs  Lab 10/28/2018 2251  AMMONIA 16  CBC: Recent Labs  Lab 11/20/2018 0533  11/05/2018 2229 10/27/2018 2318 10/30/18 0126 10/30/18 0524  WBC 9.2  --  2.9*  --   --  4.5  NEUTROABS 8.5*  --   --   --   --   --   HGB 9.7*   < > 7.9* 7.5* 8.2* 8.0*  HCT 29.6*   < > 22.4* 22.0* 24.0* 23.3*  MCV 86.0  --  81.2  --   --  80.9  PLT 238  --  144*  --   --  165   < > = values in this interval not displayed.   Cardiac Enzymes: Recent Labs  Lab 11/04/2018 1424 10/28/2018 2229  CKTOTAL 421* 665*  CKMB 15.2*  --    CBG: Recent Labs  Lab 11/12/2018 1610 11/06/2018 1959 10/30/18 0107 10/30/18 0405 10/30/18 0736  GLUCAP 186* 122* 98 93 133*    Iron Studies: No results for input(s): IRON, TIBC, TRANSFERRIN, FERRITIN in the last 72 hours. Studies/Results: Dg Wrist Complete Right  Result Date: 10/28/2018 CLINICAL DATA:  Shortness of breath with weakness and fall EXAM: RIGHT WRIST - COMPLETE 3+ VIEW COMPARISON:  09/19/2018 FINDINGS: Known distal radius fracture with dorsal impaction and displacement that has progressed. Ulnar positive variance with ulnar head impinging on the carpus. Generalized soft tissue swelling with disorganized and early heterotopic ossification or periosteal reaction.  Prominent osteopenia. IMPRESSION: Subacute, unhealed distal radius fracture with significant and progressive posterior displacement and impaction. Electronically Signed   By: Monte Fantasia M.D.   On: 11/17/2018 06:28   Ct Head Wo Contrast  Result Date: 10/30/2018 CLINICAL DATA:  Encephalopathy EXAM: CT HEAD WITHOUT CONTRAST TECHNIQUE: Contiguous axial images were obtained from the base of the skull through the vertex without intravenous contrast. COMPARISON:  None. FINDINGS: Brain: There is no mass, hemorrhage or extra-axial collection. The size and configuration of the ventricles and extra-axial CSF spaces are normal. The brain parenchyma is normal, without acute or chronic infarction. Vascular: No abnormal hyperdensity of the major intracranial arteries or dural venous sinuses. No intracranial atherosclerosis. Skull: The visualized skull base, calvarium and extracranial soft tissues are normal. Sinuses/Orbits: No fluid levels or advanced mucosal thickening of the visualized paranasal sinuses. No mastoid or middle ear effusion. The orbits are normal. IMPRESSION: No acute intracranial abnormality or explanation for encephalopathy. Electronically Signed   By: Ulyses Jarred M.D.   On: 10/30/2018 02:31   Dg Chest Port 1 View  Result Date: 11/13/2018 CLINICAL DATA:  Central line placement EXAM: PORTABLE CHEST 1 VIEW COMPARISON:  11/02/2018 FINDINGS: Right internal jugular central line placement. The tip is in the SVC. No pneumothorax. Increasing left lower lobe atelectasis or infiltrate. Small left effusion. Heart is borderline in size. IMPRESSION: Right central line placement with the tip in the SVC. No pneumothorax. Increasing left lower lobe atelectasis or pneumonia with small left effusion. Electronically Signed   By: Rolm Baptise M.D.   On: 10/25/2018 23:36   Dg Chest Port 1 View  Result Date: 10/24/2018 CLINICAL DATA:  Shortness of breath EXAM: PORTABLE CHEST 1 VIEW COMPARISON:  11/12/2018, 02/11/2018  FINDINGS: Mild asymmetric hazy opacity left thorax, suspected to be secondary to at least small pleural effusion. Cardiomegaly. Patchy atelectasis left base. No pneumothorax. IMPRESSION: 1. Cardiomegaly 2. At least small left-sided pleural effusion. Patchy atelectasis left base Electronically Signed   By: Donavan Foil M.D.   On: 11/12/2018 22:10   Dg Chest Port 1 View  Result Date: 11/09/2018 CLINICAL  DATA:  Shortness of breath.  Weakness and fall EXAM: PORTABLE CHEST 1 VIEW COMPARISON:  02/11/2018 FINDINGS: Stable cardiomegaly. Stable aortic contours when allowing for rightward rotation. Artifact from EKG leads. There is no edema, consolidation, effusion, or pneumothorax. Mild interstitial coarsening that is similar to prior. No acute osseous finding. Stable from prior. No acute finding. Mild cardiomegaly. IMPRESSION: Stable from prior.  No acute finding Electronically Signed   By: Monte Fantasia M.D.   On: 11/07/2018 06:25   Medications: Infusions: .  prismasol BGK 4/2.5 500 mL/hr at 10/30/18 0321  . sodium chloride 10 mL/hr at 10/30/18 0800  . amiodarone 60 mg/hr (10/30/18 0807)   Followed by  . amiodarone    . ceFEPime (MAXIPIME) IV    . famotidine (PEPCID) IV Stopped (10/30/18 0207)  . heparin    . magnesium sulfate bolus IVPB 4 g (10/30/18 0822)  . norepinephrine (LEVOPHED) Adult infusion 18 mcg/min (10/30/18 0800)  . phenylephrine (NEO-SYNEPHRINE) Adult infusion 20 mcg/min (10/30/18 0804)  . prismasol BGK 0/2.5 400 mL/hr at 10/30/18 0324  . prismasol BGK 4/2.5 1,500 mL/hr at 10/30/18 0323  . vancomycin    . vasopressin (PITRESSIN) infusion - *FOR SHOCK* 0.04 Units/min (10/30/18 0805)    Scheduled Medications: . aspirin  81 mg Oral Daily  . chlorhexidine  15 mL Mouth Rinse BID  . Chlorhexidine Gluconate Cloth  6 each Topical Q0600  . digoxin  0.25 mg Intravenous Q6H  . heparin  5,000 Units Subcutaneous Q8H  . hydrocortisone sod succinate (SOLU-CORTEF) inj  50 mg Intravenous Q6H   . insulin aspart  0-15 Units Subcutaneous TID WC  . insulin aspart  0-5 Units Subcutaneous QHS  . ipratropium-albuterol  3 mL Nebulization Q6H  . mouth rinse  15 mL Mouth Rinse q12n4p  . mupirocin ointment  1 application Nasal BID  . PARoxetine  20 mg Oral BH-q7a  . sodium chloride flush  3 mL Intravenous Q12H  . thiamine injection  100 mg Intravenous Daily    have reviewed scheduled and prn medications.  Physical Exam: General: on bipap-  Not really responsive  Heart: RRR Lungs: CBS bilat Abdomen: soft, non tender Extremities: pitting edema to dep area-  Skin breakdown buttocks and heals Dialysis Access: right IJ vascath placed 8/6    10/30/2018,8:41 AM  LOS: 1 day

## 2018-10-30 NOTE — Progress Notes (Signed)
Patient transported from 3M05 to CT and back with no complications.

## 2018-10-31 DIAGNOSIS — J9601 Acute respiratory failure with hypoxia: Secondary | ICD-10-CM

## 2018-10-31 LAB — RENAL FUNCTION PANEL
Albumin: 1.1 g/dL — ABNORMAL LOW (ref 3.5–5.0)
Albumin: 1 g/dL — ABNORMAL LOW (ref 3.5–5.0)
Anion gap: 19 — ABNORMAL HIGH (ref 5–15)
Anion gap: 13 (ref 5–15)
BUN: 27 mg/dL — ABNORMAL HIGH (ref 8–23)
BUN: 23 mg/dL (ref 8–23)
CO2: 30 mmol/L (ref 22–32)
CO2: 25 mmol/L (ref 22–32)
Calcium: 6.4 mg/dL — CL (ref 8.9–10.3)
Calcium: 7.1 mg/dL — ABNORMAL LOW (ref 8.9–10.3)
Chloride: 88 mmol/L — ABNORMAL LOW (ref 98–111)
Chloride: 98 mmol/L (ref 98–111)
Creatinine, Ser: 1.18 mg/dL — ABNORMAL HIGH (ref 0.44–1.00)
Creatinine, Ser: 0.99 mg/dL (ref 0.44–1.00)
GFR calc Af Amer: 56 mL/min — ABNORMAL LOW (ref >60)
GFR calc Af Amer: >60 mL/min (ref >60)
GFR calc non Af Amer: 48 mL/min — ABNORMAL LOW (ref >60)
GFR calc non Af Amer: 60 mL/min — ABNORMAL LOW (ref >60)
Glucose, Bld: 176 mg/dL — ABNORMAL HIGH (ref 70–99)
Glucose, Bld: 254 mg/dL — ABNORMAL HIGH (ref 70–99)
Phosphorus: 2.6 mg/dL (ref 2.5–4.6)
Phosphorus: 1.7 mg/dL — ABNORMAL LOW (ref 2.5–4.6)
Potassium: 4.2 mmol/L (ref 3.5–5.1)
Potassium: 4.1 mmol/L (ref 3.5–5.1)
Sodium: 137 mmol/L (ref 135–145)
Sodium: 136 mmol/L (ref 135–145)

## 2018-10-31 LAB — PHOSPHORUS: Phosphorus: 2.6 mg/dL (ref 2.5–4.6)

## 2018-10-31 LAB — GLUCOSE, CAPILLARY
Glucose-Capillary: 100 mg/dL — ABNORMAL HIGH (ref 70–99)
Glucose-Capillary: 118 mg/dL — ABNORMAL HIGH (ref 70–99)
Glucose-Capillary: 179 mg/dL — ABNORMAL HIGH (ref 70–99)
Glucose-Capillary: 181 mg/dL — ABNORMAL HIGH (ref 70–99)
Glucose-Capillary: 208 mg/dL — ABNORMAL HIGH (ref 70–99)
Glucose-Capillary: 212 mg/dL — ABNORMAL HIGH (ref 70–99)
Glucose-Capillary: 222 mg/dL — ABNORMAL HIGH (ref 70–99)

## 2018-10-31 LAB — MAGNESIUM
Magnesium: 2.2 mg/dL (ref 1.7–2.4)
Magnesium: 2.3 mg/dL (ref 1.7–2.4)

## 2018-10-31 LAB — PROCALCITONIN: Procalcitonin: 25.36 ng/mL

## 2018-10-31 MED ORDER — PHENYLEPHRINE HCL-NACL 10-0.9 MG/250ML-% IV SOLN
INTRAVENOUS | Status: AC
  Administered 2018-10-31: 10 mg
  Filled 2018-10-31: qty 250

## 2018-10-31 MED ORDER — CALCIUM GLUCONATE-NACL 2-0.675 GM/100ML-% IV SOLN
2.0000 g | Freq: Once | INTRAVENOUS | Status: AC
  Administered 2018-10-31: 06:00:00 2000 mg via INTRAVENOUS
  Filled 2018-10-31: qty 100

## 2018-10-31 MED ORDER — CHLORHEXIDINE GLUCONATE 0.12% ORAL RINSE (MEDLINE KIT)
15.0000 mL | Freq: Two times a day (BID) | OROMUCOSAL | Status: DC
  Administered 2018-10-31 – 2018-11-03 (×8): 15 mL via OROMUCOSAL

## 2018-10-31 MED ORDER — IPRATROPIUM-ALBUTEROL 0.5-2.5 (3) MG/3ML IN SOLN: 3.0000 mL | RESPIRATORY_TRACT | Status: DC | PRN

## 2018-10-31 MED ORDER — SODIUM PHOSPHATES 45 MMOLE/15ML IV SOLN
10.0000 mmol | Freq: Once | INTRAVENOUS | Status: AC
  Administered 2018-10-31: 22:00:00 10 mmol via INTRAVENOUS
  Filled 2018-10-31: qty 3.33

## 2018-10-31 MED ORDER — ACETAMINOPHEN 650 MG RE SUPP: 650.0000 mg | Freq: Four times a day (QID) | RECTAL | Status: DC | PRN

## 2018-10-31 MED ORDER — PRISMASOL BGK 4/2.5 32-4-2.5 MEQ/L REPLACEMENT SOLN
Status: DC
  Administered 2018-10-31 – 2018-11-01 (×3): via INTRAVENOUS_CENTRAL
  Filled 2018-10-31 (×6): qty 5000

## 2018-10-31 MED ORDER — SODIUM CHLORIDE 0.9 % IV SOLN
INTRAVENOUS | Status: AC
  Administered 2018-10-31: 08:00:00 via INTRAVENOUS

## 2018-10-31 MED ORDER — PHENYLEPHRINE HCL-NACL 40-0.9 MG/250ML-% IV SOLN
0.0000 ug/min | INTRAVENOUS | Status: DC
  Administered 2018-10-31: 130 ug/min via INTRAVENOUS
  Administered 2018-10-31: 120 ug/min via INTRAVENOUS
  Filled 2018-10-31 (×3): qty 250

## 2018-10-31 MED ORDER — ACETAMINOPHEN 325 MG PO TABS: 650.0000 mg | ORAL_TABLET | Freq: Four times a day (QID) | ORAL | Status: DC | PRN

## 2018-10-31 MED ORDER — VITAMIN B-1 100 MG PO TABS
100.0000 mg | ORAL_TABLET | Freq: Every day | ORAL | Status: DC
  Administered 2018-10-31 – 2018-11-01 (×2): 100 mg
  Filled 2018-10-31 (×3): qty 1

## 2018-10-31 MED ORDER — PAROXETINE HCL 20 MG PO TABS
20.0000 mg | ORAL_TABLET | Freq: Every day | ORAL | Status: DC
  Administered 2018-10-31 – 2018-11-02 (×3): 20 mg
  Filled 2018-10-31 (×3): qty 1

## 2018-10-31 MED ORDER — FAMOTIDINE 40 MG/5ML PO SUSR
20.0000 mg | Freq: Every day | ORAL | Status: DC
  Administered 2018-10-31 – 2018-11-03 (×4): 20 mg
  Filled 2018-10-31 (×4): qty 2.5

## 2018-10-31 MED ORDER — ORAL CARE MOUTH RINSE
15.0000 mL | OROMUCOSAL | Status: DC
  Administered 2018-10-31 – 2018-11-03 (×35): 15 mL via OROMUCOSAL
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
## 2018-10-31 NOTE — Progress Notes (Signed)
CRITICAL VALUE ALERT  Critical Value: Ca+ 6.4  Date & Time Notied:  10-31-18 0538  Provider Notified: E. Deterding  Orders Received/Actions taken: NA

## 2018-10-31 NOTE — Progress Notes (Signed)
Subjective:  CRRT running well- pt remains critically ill- no UOP - had LP- negative   Objective Vital signs in last 24 hours: Vitals:   10/31/18 0500 10/31/18 0600 10/31/18 0700 10/31/18 0726  BP:      Pulse: (!) 124 (!) 129 (!) 133 (!) 131  Resp: '19 20 19 ' (!) 22  Temp:      TempSrc:      SpO2: 100% 100% 100% 100%  Weight:      Height:       Weight change: 3.6 kg  Intake/Output Summary (Last 24 hours) at 10/31/2018 0730 Last data filed at 10/31/2018 0700 Gross per 24 hour  Intake 3572.04 ml  Output 3513 ml  Net 59.04 ml    Assessment/ Plan: Pt is a 68 y.o. yo female with DM, HTN, who was admitted on 11/10/2018 with hypotension and AKI with hyperkalemia   Assessment/Plan: 1. Renal- last known crt 1.2 in December of 2019.  Now presenting with AKI in the setting of hypotension- maybe sepsis and many possible offending meds- farxiga, metformin, ace and NSAIDS on hold.  CRRT initiation on 8/6- now no UOP.  Cont to support with CRRT but watch for signs of renal recovery   2. Hyperkalemia-  Improved with CRRT - is now on all 4 k dialysate 3.  Metabolic acidosis-  Will order bicarb post filter - metformin held- bicarb now 30-  Stop supplemental bicarb, dialysate only  4. Anemia- due to situation and AKI-  Supportive care for now  5. HTN/volume- does have some pitting edema-  Trying to keep even with CRRT- albumin 1.1- must have been very chronically ill at baseline - not a good predictor of outcome.  Unable to obtain CVP-  Given tachy and cont hypotension will try to challenge with ivf  6. Sepsis ?  On vanc, rocephin - LP was done, appears negative 7. Hypocalcemia-  Corrected seems OK, in 8's-  Getting some calcium gluc this AM  Kristen Jones    Labs: Basic Metabolic Panel: Recent Labs  Lab 10/30/18 0524  10/30/18 1553 10/30/18 1720 10/30/18 2037 10/31/18 0356  NA 132*   < > 135 133*  --  137  K 5.4*   < > 4.6 4.6  --  4.2  CL 101  --   --  93*  --  88*  CO2 11*  --    --  25  --  30  GLUCOSE 169*  --   --  123*  --  176*  BUN 94*  --   --  49*  --  27*  CREATININE 2.49*  --   --  1.53*  --  1.18*  CALCIUM 6.9*  --   --  6.4*  --  6.4*  PHOS 5.6*  --   --  3.9 3.7 2.6  2.6   < > = values in this interval not displayed.   Liver Function Tests: Recent Labs  Lab 11/14/2018 0533  11/01/2018 2229 10/30/18 0524 10/30/18 1720 10/31/18 0356  AST 78*  --  100*  --   --   --   ALT 79*  --  65*  --   --   --   ALKPHOS 126  --  108  --   --   --   BILITOT 3.4*  --  2.4*  --   --   --   PROT 6.5  --  4.3*  --   --   --  ALBUMIN 2.2*   < > 1.3* 1.2* 1.1* 1.1*   < > = values in this interval not displayed.   No results for input(s): LIPASE, AMYLASE in the last 168 hours. Recent Labs  Lab 11/12/2018 2251  AMMONIA 16   CBC: Recent Labs  Lab 10/27/2018 0533  10/28/2018 2229  10/30/18 0524 10/30/18 1122 10/30/18 1553  WBC 9.2  --  2.9*  --  4.5  --   --   NEUTROABS 8.5*  --   --   --   --   --   --   HGB 9.7*   < > 7.9*   < > 8.0* 7.8* 8.5*  HCT 29.6*   < > 22.4*   < > 23.3* 23.0* 25.0*  MCV 86.0  --  81.2  --  80.9  --   --   PLT 238  --  144*  --  165  --   --    < > = values in this interval not displayed.   Cardiac Enzymes: Recent Labs  Lab 11/20/2018 1424 10/26/2018 2229  CKTOTAL 421* 665*  CKMB 15.2*  --    CBG: Recent Labs  Lab 10/30/18 1134 10/30/18 1639 10/30/18 1935 10/30/18 2356 10/31/18 0352  GLUCAP 121* 118* 111* 151* 100*    Iron Studies: No results for input(s): IRON, TIBC, TRANSFERRIN, FERRITIN in the last 72 hours. Studies/Results: Ct Head Wo Contrast  Result Date: 10/30/2018 CLINICAL DATA:  Encephalopathy EXAM: CT HEAD WITHOUT CONTRAST TECHNIQUE: Contiguous axial images were obtained from the base of the skull through the vertex without intravenous contrast. COMPARISON:  None. FINDINGS: Brain: There is no mass, hemorrhage or extra-axial collection. The size and configuration of the ventricles and extra-axial CSF spaces are  normal. The brain parenchyma is normal, without acute or chronic infarction. Vascular: No abnormal hyperdensity of the major intracranial arteries or dural venous sinuses. No intracranial atherosclerosis. Skull: The visualized skull base, calvarium and extracranial soft tissues are normal. Sinuses/Orbits: No fluid levels or advanced mucosal thickening of the visualized paranasal sinuses. No mastoid or middle ear effusion. The orbits are normal. IMPRESSION: No acute intracranial abnormality or explanation for encephalopathy. Electronically Signed   By: Ulyses Jarred M.D.   On: 10/30/2018 02:31   Portable Chest X-ray  Result Date: 10/30/2018 CLINICAL DATA:  Post intubation and NG tube placement EXAM: PORTABLE CHEST 1 VIEW COMPARISON:  October 29, 2018 FINDINGS: Endotracheal tube tip is seen 1.3 cm above the level of carina. NG tube is seen projecting over the stomach. A right-sided central venous catheter is seen with the tip at the superior cavoatrial junction. There is a small left pleural effusion with hazy airspace opacity layering within the left lower lung. The right lung is clear. No pneumothorax. The cardiomediastinal silhouette is unchanged. No acute osseous abnormality. IMPRESSION: 1. ET tube and NG tube in satisfactory position 2. Small left pleural effusion with adjacent layering effusions/atelectasis. Electronically Signed   By: Prudencio Pair M.D.   On: 10/30/2018 15:27   Dg Chest Port 1 View  Result Date: 10/28/2018 CLINICAL DATA:  Central line placement EXAM: PORTABLE CHEST 1 VIEW COMPARISON:  11/19/2018 FINDINGS: Right internal jugular central line placement. The tip is in the SVC. No pneumothorax. Increasing left lower lobe atelectasis or infiltrate. Small left effusion. Heart is borderline in size. IMPRESSION: Right central line placement with the tip in the SVC. No pneumothorax. Increasing left lower lobe atelectasis or pneumonia with small left effusion. Electronically Signed   By:  Rolm Baptise  M.D.   On: 11/23/2018 23:36   Dg Chest Port 1 View  Result Date: 11/15/2018 CLINICAL DATA:  Shortness of breath EXAM: PORTABLE CHEST 1 VIEW COMPARISON:  10/25/2018, 02/11/2018 FINDINGS: Mild asymmetric hazy opacity left thorax, suspected to be secondary to at least small pleural effusion. Cardiomegaly. Patchy atelectasis left base. No pneumothorax. IMPRESSION: 1. Cardiomegaly 2. At least small left-sided pleural effusion. Patchy atelectasis left base Electronically Signed   By: Donavan Foil M.D.   On: 11/09/2018 22:10   Vas [place] Lower Extremity Venous (dvt)  Result Date: 10/30/2018  Lower Venous Study Indications: Acute respiratory failure.  Limitations: Right groin arterial line, restricted mobility, patient receiving hemodialysis. Comparison Study: No prior study. Performing Technologist: Maudry Mayhew MHA, RDMS, RVT, RDCS  Examination Guidelines: A complete evaluation includes B-mode imaging, spectral Doppler, color Doppler, and power Doppler as needed of all accessible portions of each vessel. Bilateral testing is considered an integral part of a complete examination. Limited examinations for reoccurring indications may be performed as noted.  +---------+---------------+---------+-----------+----------+------------------+ RIGHT    CompressibilityPhasicitySpontaneityPropertiesSummary            +---------+---------------+---------+-----------+----------+------------------+ CFV                                                   Not visualized     +---------+---------------+---------+-----------+----------+------------------+ SFJ                                                   Not visualized     +---------+---------------+---------+-----------+----------+------------------+ FV Prox                                               Not visualized     +---------+---------------+---------+-----------+----------+------------------+ FV Mid   Full                                                             +---------+---------------+---------+-----------+----------+------------------+ FV DistalFull                                                            +---------+---------------+---------+-----------+----------+------------------+ PFV                                                   Not visualized     +---------+---------------+---------+-----------+----------+------------------+ POP  Not visualized     +---------+---------------+---------+-----------+----------+------------------+ PTV      Full                                         Limited evaluation +---------+---------------+---------+-----------+----------+------------------+ PERO                                                  Not visualized     +---------+---------------+---------+-----------+----------+------------------+   +---------+---------------+---------+-----------+----------+--------------+ LEFT     CompressibilityPhasicitySpontaneityPropertiesSummary        +---------+---------------+---------+-----------+----------+--------------+ CFV      Full           No       Yes                  Pulsatile flow +---------+---------------+---------+-----------+----------+--------------+ SFJ      Full                                                        +---------+---------------+---------+-----------+----------+--------------+ FV Prox  Full                                                        +---------+---------------+---------+-----------+----------+--------------+ FV Mid   Full                                                        +---------+---------------+---------+-----------+----------+--------------+ FV DistalFull                                                        +---------+---------------+---------+-----------+----------+--------------+ PFV                                                    Not visualized +---------+---------------+---------+-----------+----------+--------------+ POP                                                   Not visualized +---------+---------------+---------+-----------+----------+--------------+ PTV                                                   Not visualized +---------+---------------+---------+-----------+----------+--------------+ PERO  Not visualized +---------+---------------+---------+-----------+----------+--------------+     Summary: Right: There is no evidence of deep vein thrombosis in the lower extremity. However, portions of this examination were limited- see technologist comments above. No cystic structure found in the popliteal fossa. Left: There is no evidence of deep vein thrombosis in the lower extremity. However, portions of this examination were limited- see technologist comments above. No cystic structure found in the popliteal fossa. Pulsatile venous flow is suggestive of possible elevated right heart pressure.  *See table(s) above for measurements and observations.    Preliminary    Medications: Infusions: .  prismasol BGK 4/2.5 500 mL/hr at 10/31/18 0050  . sodium chloride 500 mL (10/30/18 2122)  . amiodarone 30 mg/hr (10/31/18 0610)  . cefTRIAXone (ROCEPHIN)  IV 2 g (10/31/18 0059)  . dexmedetomidine (PRECEDEX) IV infusion    . famotidine (PEPCID) IV 20 mg (10/31/18 0019)  . feeding supplement (VITAL HIGH PROTEIN) 50 mL/hr at 10/30/18 1900  . heparin    . norepinephrine (LEVOPHED) Adult infusion Stopped (10/30/18 0802)  . phenylephrine (NEO-SYNEPHRINE) Adult infusion 130 mcg/min (10/31/18 0517)  . prismasol BGK 4/2.5 1,500 mL/hr at 10/31/18 0419  . sodium bicarbonate (isotonic) 1000 mL infusion 400 mL/hr at 10/31/18 0629  . vancomycin 500 mg (10/31/18 0229)  . vasopressin (PITRESSIN) infusion - *FOR SHOCK* Stopped (10/30/18 2043)    Scheduled  Medications: . aspirin EC  81 mg Oral Daily  . B-complex with vitamin C  1 tablet Per Tube Daily  . chlorhexidine gluconate (MEDLINE KIT)  15 mL Mouth Rinse BID  . Chlorhexidine Gluconate Cloth  6 each Topical Q0600  . Gerhardt's butt cream   Topical BID  . heparin  5,000 Units Subcutaneous Q8H  . hydrocortisone sod succinate (SOLU-CORTEF) inj  50 mg Intravenous Q6H  . insulin aspart  0-15 Units Subcutaneous Q4H  . ipratropium-albuterol  3 mL Nebulization Q6H  . mouth rinse  15 mL Mouth Rinse 10 times per day  . mupirocin ointment  1 application Nasal BID  . PARoxetine  20 mg Per Tube Daily  . sodium chloride flush  3 mL Intravenous Q12H  . thiamine injection  100 mg Intravenous Daily    have reviewed scheduled and prn medications.  Physical Exam: General: intubated now-  sedated Heart: RRR Lungs: CBS bilat Abdomen: soft, non tender Extremities: pitting edema to dep area-  Skin breakdown buttocks and heals Dialysis Access: right IJ vascath placed 8/6    10/31/2018,7:30 AM  LOS: 2 days

## 2018-10-31 NOTE — Progress Notes (Signed)
eLink Physician-Brief Progress Note Patient Name: [name] DOB: March 17, 1951 MRN: [id number]   Date of Service  10/31/2018  HPI/Events of Note  Notified of Ca 7.1 and Phos 1.7  eICU Interventions  Corrected calcium 9.5, K 4.1, creatinine 0.99  Ordered NaPhos 19mmol IV     Intervention Category Major Interventions: Electrolyte abnormality - evaluation and management  Judd Lien 10/31/2018, 8:44 PM

## 2018-10-31 NOTE — Progress Notes (Signed)
NAME:  Kristen Jones, MRN:  [id number], DOB:  Mar 12, 1951, LOS: 2 ADMISSION DATE:  11/02/2018, CONSULTATION DATE:  11/07/2018 REFERRING MD:  Betsey Holiday, ER, CHIEF COMPLAINT:  Fall  Brief History   68 yo female fell at home and had Rt wrist fx.  Found to have hypotension from sepsis and AKI with hyperkalemia.  Past Medical History  DM, HTN, HLD, COPD, Anxiety/depression, combined CHF, CAD, CKD 3  Significant Hospital Events   8/6 Admit, start CRRT 8/7 VDRF  Consults:  Nephrology  ID  Procedures:  A lin 8/6 >> Rt IJ HD cath 8/6 >> ETT 8/7>>   Significant Diagnostic Tests:  LP 8/7 >> RBC 1, WBC 0, glucose 95, protein 25 EEG 8/7 >> diffuse encephalopathy CT head 8/7 >> no acute findings Echo 8/7 >> EF 35 to 40%  Micro Data:  COVID 8/6 >> negative Blood 8/6 >> Klebsiella, MRSA CSF 8/7 >>  CSF HSV 8/7 >>   Antimicrobials:  Vancomycin 8/6 >> Rocephin 8/6 >>   Interim history/subjective:  Remains on vent, CRRT, pressors.  Objective   Blood pressure (!) 95/48, pulse (!) 137, temperature (!) 97.4 F (36.3 C), temperature source Axillary, resp. rate 19, height 4\' 10"  (1.473 m), weight 59.5 kg, SpO2 99 %.    Vent Mode: PRVC FiO2 (%):  [40 %-100 %] 40 % Set Rate:  [16 bmp] 16 bmp Vt Set:  [320 mL] 320 mL PEEP:  [5 cmH20] 5 cmH20 Plateau Pressure:  [13 cmH20] 13 cmH20   Intake/Output Summary (Last 24 hours) at 10/31/2018 0846 Last data filed at 10/31/2018 0800 Gross per 24 hour  Intake 3630.42 ml  Output 3609 ml  Net 21.42 ml   Filed Weights   10/25/2018 1247 10/30/18 0300 10/31/18 0416  Weight: 55.9 kg 59.3 kg 59.5 kg    Examination:  General - ill appearing Eyes - pupils reactive ENT - ETT in place Cardiac - irregular Chest - equal breath sounds b/l, no wheezing or rales Abdomen - soft, non tender, + bowel sounds Extremities - 2+ edema, Rt wrist in wrap Skin - no rashes Neuro - RASS -2  Resolved Hospital Problem list   Hyperkalemia, Metabolic acidosis   Assessment & Plan:   Acute hypoxic respiratory failure in setting of sepsis, AKI, AMS. Hx of COPD. - full vent support - f/u CXR, ABG - prn BDs  Septic shock with Klebsiella, MRSA bacteremia. - day 3 of ABx, per ID - pressors to keep MAP > 65 - continue solu cortef while on pressors  AKI from ATN. CKD 3. - CRRT   A fib with RVR. Acute on chronic combined CHF. Hx of HTN, HLD. - continue amiodarone gtt - continue ASA - hold outpt norvasc, coreg, zetia, lasix, labetalol, lisinopril, crestor  Acute metabolic encephalopathy. Chronic pain. Hx of depression. - RASS goal 0 to -1 - continue paxil - hold outpt naprosyn, oxycodone, norco  DM type II. - SSI - hold outpt metformin  Moderate protein calorie malnutrition. - tube feeds  Anemia of critical illness and chronic disease. - f/u CBC - transfuse for Hb < 7 or significant bleeding  Rt wrist fx. - f/u with orthopedics when medically stable   Best practice:  Diet: tube feeds DVT prophylaxis: SQ heparin GI prophylaxis: pepcid Mobility: bed rest Code Status: full code Disposition: ICU  Labs    CMP Latest Ref Rng & Units 10/31/2018 10/30/2018 10/30/2018  Glucose 70 - 99 mg/dL 176(H) 123(H) -  BUN 8 - 23 mg/dL  27(H) 49(H) -  Creatinine 0.44 - 1.00 mg/dL 1.18(H) 1.53(H) -  Sodium 135 - 145 mmol/L 137 133(L) 135  Potassium 3.5 - 5.1 mmol/L 4.2 4.6 4.6  Chloride 98 - 111 mmol/L 88(L) 93(L) -  CO2 22 - 32 mmol/L 30 25 -  Calcium 8.9 - 10.3 mg/dL 6.4(LL) 6.4(LL) -  Total Protein 6.5 - 8.1 g/dL - - -  Total Bilirubin 0.3 - 1.2 mg/dL - - -  Alkaline Phos 38 - 126 U/L - - -  AST 15 - 41 U/L - - -  ALT 0 - 44 U/L - - -   CBC Latest Ref Rng & Units 10/30/2018 10/30/2018 10/30/2018  WBC 4.0 - 10.5 K/uL - - 4.5  Hemoglobin 12.0 - 15.0 g/dL 8.5(L) 7.8(L) 8.0(L)  Hematocrit 36.0 - 46.0 % 25.0(L) 23.0(L) 23.3(L)  Platelets 150 - 400 K/uL - - 165   ABG    Component Value Date/Time   PHART 7.485 (H) 10/30/2018 1553   PCO2ART  41.4 10/30/2018 1553   PO2ART 293.0 (H) 10/30/2018 1553   HCO3 31.2 (H) 10/30/2018 1553   TCO2 32 10/30/2018 1553   ACIDBASEDEF 5.0 (H) 10/30/2018 1122   O2SAT 100.0 10/30/2018 1553   CBG (last 3)  Recent Labs    10/30/18 2356 10/31/18 0352 10/31/18 0738  GLUCAP 151* 100* 181*    CC time 34 minutes  Chesley Mires, MD [place] 10/31/2018, 9:13 AM

## 2018-11-01 ENCOUNTER — Inpatient Hospital Stay (HOSPITAL_COMMUNITY): Payer: Medicare Other

## 2018-11-01 DIAGNOSIS — R680 Hypothermia, not associated with low environmental temperature: Secondary | ICD-10-CM

## 2018-11-01 DIAGNOSIS — J90 Pleural effusion, not elsewhere classified: Secondary | ICD-10-CM

## 2018-11-01 DIAGNOSIS — Z978 Presence of other specified devices: Secondary | ICD-10-CM

## 2018-11-01 DIAGNOSIS — R58 Hemorrhage, not elsewhere classified: Secondary | ICD-10-CM

## 2018-11-01 DIAGNOSIS — I4891 Unspecified atrial fibrillation: Secondary | ICD-10-CM

## 2018-11-01 LAB — CULTURE, BLOOD (ROUTINE X 2)
Special Requests: ADEQUATE
Special Requests: ADEQUATE

## 2018-11-01 LAB — RENAL FUNCTION PANEL
Albumin: <1 g/dL — ABNORMAL LOW (ref 3.5–5.0)
Albumin: <1 g/dL — ABNORMAL LOW (ref 3.5–5.0)
Anion gap: 13 (ref 5–15)
Anion gap: 16 — ABNORMAL HIGH (ref 5–15)
BUN: 21 mg/dL (ref 8–23)
BUN: 25 mg/dL — ABNORMAL HIGH (ref 8–23)
CO2: 24 mmol/L (ref 22–32)
CO2: 21 mmol/L — ABNORMAL LOW (ref 22–32)
Calcium: 7.3 mg/dL — ABNORMAL LOW (ref 8.9–10.3)
Calcium: 7.5 mg/dL — ABNORMAL LOW (ref 8.9–10.3)
Chloride: 99 mmol/L (ref 98–111)
Chloride: 100 mmol/L (ref 98–111)
Creatinine, Ser: 0.79 mg/dL (ref 0.44–1.00)
Creatinine, Ser: 1.14 mg/dL — ABNORMAL HIGH (ref 0.44–1.00)
GFR calc Af Amer: >60 mL/min (ref >60)
GFR calc Af Amer: 58 mL/min — ABNORMAL LOW (ref >60)
GFR calc non Af Amer: >60 mL/min (ref >60)
GFR calc non Af Amer: 50 mL/min — ABNORMAL LOW (ref >60)
Glucose, Bld: 282 mg/dL — ABNORMAL HIGH (ref 70–99)
Glucose, Bld: 262 mg/dL — ABNORMAL HIGH (ref 70–99)
Phosphorus: 1.5 mg/dL — ABNORMAL LOW (ref 2.5–4.6)
Phosphorus: 2.5 mg/dL (ref 2.5–4.6)
Potassium: 4 mmol/L (ref 3.5–5.1)
Potassium: 4.5 mmol/L (ref 3.5–5.1)
Sodium: 136 mmol/L (ref 135–145)
Sodium: 137 mmol/L (ref 135–145)

## 2018-11-01 LAB — GLUCOSE, CAPILLARY
Glucose-Capillary: 254 mg/dL — ABNORMAL HIGH (ref 70–99)
Glucose-Capillary: 193 mg/dL — ABNORMAL HIGH (ref 70–99)
Glucose-Capillary: 235 mg/dL — ABNORMAL HIGH (ref 70–99)
Glucose-Capillary: 262 mg/dL — ABNORMAL HIGH (ref 70–99)
Glucose-Capillary: 182 mg/dL — ABNORMAL HIGH (ref 70–99)

## 2018-11-01 LAB — CBC
HCT: 23.7 % — ABNORMAL LOW (ref 36.0–46.0)
Hemoglobin: 8.3 g/dL — ABNORMAL LOW (ref 12.0–15.0)
MCH: 28.2 pg (ref 26.0–34.0)
MCHC: 35 g/dL (ref 30.0–36.0)
MCV: 80.6 fL (ref 80.0–100.0)
Platelets: UNDETERMINED 10*3/uL (ref 150–400)
RBC: 2.94 MIL/uL — ABNORMAL LOW (ref 3.87–5.11)
RDW: 16.8 % — ABNORMAL HIGH (ref 11.5–15.5)
WBC: 8.8 10*3/uL (ref 4.0–10.5)
nRBC: 1.1 % — ABNORMAL HIGH (ref 0.0–0.2)

## 2018-11-01 LAB — POCT I-STAT 7, (LYTES, BLD GAS, ICA,H+H)
Acid-Base Excess: 3 mmol/L — ABNORMAL HIGH (ref 0.0–2.0)
Bicarbonate: 26.4 mmol/L (ref 20.0–28.0)
Calcium, Ion: 1.07 mmol/L — ABNORMAL LOW (ref 1.15–1.40)
HCT: 34 % — ABNORMAL LOW (ref 36.0–46.0)
Hemoglobin: 11.6 g/dL — ABNORMAL LOW (ref 12.0–15.0)
O2 Saturation: 95 %
Patient temperature: 96.2
Potassium: 4 mmol/L (ref 3.5–5.1)
Sodium: 137 mmol/L (ref 135–145)
TCO2: 27 mmol/L (ref 22–32)
pCO2 arterial: 34.5 mmHg (ref 32.0–48.0)
pH, Arterial: 7.486 — ABNORMAL HIGH (ref 7.350–7.450)
pO2, Arterial: 66 mmHg — ABNORMAL LOW (ref 83.0–108.0)

## 2018-11-01 LAB — IMMUNOFIXATION ELECTROPHORESIS
IgA: 98 mg/dL (ref 87–352)
IgG (Immunoglobin G), Serum: 778 mg/dL (ref 586–1602)
IgM (Immunoglobulin M), Srm: 47 mg/dL (ref 26–217)
Total Protein ELP: 5.5 g/dL — ABNORMAL LOW (ref 6.0–8.5)

## 2018-11-01 LAB — HSV DNA BY PCR (REFERENCE LAB)
HSV 1 DNA: NEGATIVE
HSV 2 DNA: NEGATIVE

## 2018-11-01 LAB — PHOSPHORUS: Phosphorus: 1.5 mg/dL — ABNORMAL LOW (ref 2.5–4.6)

## 2018-11-01 LAB — MAGNESIUM: Magnesium: 2.1 mg/dL (ref 1.7–2.4)

## 2018-11-01 MED ORDER — CEFAZOLIN SODIUM-DEXTROSE 2-4 GM/100ML-% IV SOLN
2.0000 g | Freq: Three times a day (TID) | INTRAVENOUS | Status: DC
  Administered 2018-11-01 – 2018-11-02 (×2): 2 g via INTRAVENOUS
  Filled 2018-11-01 (×4): qty 100

## 2018-11-01 MED ORDER — PHENYLEPHRINE HCL-NACL 40-0.9 MG/250ML-% IV SOLN
0.0000 ug/min | INTRAVENOUS | Status: DC
  Administered 2018-11-01: 25 ug/min via INTRAVENOUS

## 2018-11-01 MED ORDER — CHLORHEXIDINE GLUCONATE 0.12 % MT SOLN
OROMUCOSAL | Status: AC
  Filled 2018-11-01: qty 15

## 2018-11-01 MED ORDER — HYDROCORTISONE NA SUCCINATE PF 100 MG IJ SOLR
50.0000 mg | Freq: Two times a day (BID) | INTRAMUSCULAR | Status: DC
  Administered 2018-11-01: 50 mg via INTRAVENOUS
  Filled 2018-11-01: qty 2

## 2018-11-01 MED ORDER — PHENYLEPHRINE HCL-NACL 10-0.9 MG/250ML-% IV SOLN: 0.0000 ug/min | INTRAVENOUS | Status: DC

## 2018-11-01 MED ORDER — INSULIN DETEMIR 100 UNIT/ML ~~LOC~~ SOLN
10.0000 [IU] | Freq: Every day | SUBCUTANEOUS | Status: DC
  Administered 2018-11-01 – 2018-11-02 (×2): 10 [IU] via SUBCUTANEOUS
  Filled 2018-11-01 (×3): qty 0.1

## 2018-11-01 MED ORDER — SODIUM PHOSPHATES 45 MMOLE/15ML IV SOLN
20.0000 mmol | Freq: Once | INTRAVENOUS | Status: AC
  Administered 2018-11-01: 09:00:00 20 mmol via INTRAVENOUS
  Filled 2018-11-01: qty 6.67

## 2018-11-01 NOTE — Progress Notes (Signed)
Subjective:  CRRT running well- pt remains ill but actually off pressors this AM, hypothermic-  Essentially no UOP - still tachy  Objective Vital signs in last 24 hours: Vitals:   11/01/18 0406 11/01/18 0500 11/01/18 0600 11/01/18 0700  BP:      Pulse:  (!) 126 (!) 124 (!) 125  Resp:  (!) 24 (!) 26 (!) 26  Temp:      TempSrc:      SpO2: 100% 100% 100% 100%  Weight:  59.2 kg    Height:       Weight change: -0.3 kg  Intake/Output Summary (Last 24 hours) at 11/01/2018 0743 Last data filed at 11/01/2018 0700 Gross per 24 hour  Intake 3337.98 ml  Output 2807 ml  Net 530.98 ml    Assessment/ Plan: Pt is a 68 y.o. yo female with DM, HTN, who was admitted on 10/28/2018 with hypotension and AKI with hyperkalemia   Assessment/Plan: 1. Renal- last known crt 1.2 in December of 2019.  Now presenting with AKI in the setting of hypotension- maybe sepsis and many possible offending meds- farxiga, metformin, ace and NSAIDS- all on hold.  CRRT initiation on 8/6- essentially no UOP. Since hemodynamics better and crt under 1 have told nursing that if CRRT clots to go ahead and stop and we will observe.  Since hemodynamics better if she need further RRT likely could do IHD 2. Hyperkalemia-  Improved with CRRT - is now on all 4 k dialysate 3.  Metabolic acidosis-  Improved-  dialysate only  4. Anemia- due to situation and AKI-  Supportive care for now  5. HTN/volume- does have some pitting edema-  Trying to keep even with CRRT- albumin 1.1- must have been very chronically ill at baseline - not a good predictor of outcome.  Unable to obtain CVP-  Given tachy and cont hypotension tried to challenge with ivf - did not really do much- cont to keep even 6. Sepsis ?  On vanc, rocephin - LP was done, appears negative 7. Hypocalcemia-  Corrected seems OK, in 9's-   8. Hypophos-   repleting   Louis Meckel    Labs: Basic Metabolic Panel: Recent Labs  Lab 10/31/18 0356 10/31/18 1602 11/01/18 0410  11/01/18 0417  NA 137 136 136 137  K 4.2 4.1 4.0 4.0  CL 88* 98 99  --   CO2 '30 25 24  ' --   GLUCOSE 176* 254* 282*  --   BUN 27* 23 21  --   CREATININE 1.18* 0.99 0.79  --   CALCIUM 6.4* 7.1* 7.3*  --   PHOS 2.6  2.6 1.7* 1.5*  1.5*  --    Liver Function Tests: Recent Labs  Lab 10/28/2018 0533  11/13/2018 2229  10/31/18 0356 10/31/18 1602 11/01/18 0410  AST 78*  --  100*  --   --   --   --   ALT 79*  --  65*  --   --   --   --   ALKPHOS 126  --  108  --   --   --   --   BILITOT 3.4*  --  2.4*  --   --   --   --   PROT 6.5  --  4.3*  --   --   --   --   ALBUMIN 2.2*   < > 1.3*   < > 1.1* 1.0* <1.0*   < > = values in this interval  not displayed.   No results for input(s): LIPASE, AMYLASE in the last 168 hours. Recent Labs  Lab 11/06/2018 2251  AMMONIA 16   CBC: Recent Labs  Lab 10/29/2018 0533  11/11/2018 2229  10/30/18 0524  10/30/18 1553 11/01/18 0410 11/01/18 0417  WBC 9.2  --  2.9*  --  4.5  --   --  8.8  --   NEUTROABS 8.5*  --   --   --   --   --   --   --   --   HGB 9.7*   < > 7.9*   < > 8.0*   < > 8.5* 8.3* 11.6*  HCT 29.6*   < > 22.4*   < > 23.3*   < > 25.0* 23.7* 34.0*  MCV 86.0  --  81.2  --  80.9  --   --  80.6  --   PLT 238  --  144*  --  165  --   --  PLATELET CLUMPS NOTED ON SMEAR, UNABLE TO ESTIMATE  --    < > = values in this interval not displayed.   Cardiac Enzymes: Recent Labs  Lab 11/01/2018 1424 11/11/2018 2229  CKTOTAL 421* 665*  CKMB 15.2*  --    CBG: Recent Labs  Lab 10/31/18 1135 10/31/18 1558 10/31/18 2017 10/31/18 2334 11/01/18 0408  GLUCAP 179* 212* 222* 208* 254*    Iron Studies: No results for input(s): IRON, TIBC, TRANSFERRIN, FERRITIN in the last 72 hours. Studies/Results: Portable Chest X-ray  Result Date: 10/30/2018 CLINICAL DATA:  Post intubation and NG tube placement EXAM: PORTABLE CHEST 1 VIEW COMPARISON:  October 29, 2018 FINDINGS: Endotracheal tube tip is seen 1.3 cm above the level of carina. NG tube is seen projecting  over the stomach. A right-sided central venous catheter is seen with the tip at the superior cavoatrial junction. There is a small left pleural effusion with hazy airspace opacity layering within the left lower lung. The right lung is clear. No pneumothorax. The cardiomediastinal silhouette is unchanged. No acute osseous abnormality. IMPRESSION: 1. ET tube and NG tube in satisfactory position 2. Small left pleural effusion with adjacent layering effusions/atelectasis. Electronically Signed   By: Prudencio Pair M.D.   On: 10/30/2018 15:27   Vas [place] Lower Extremity Venous (dvt)  Result Date: 10/30/2018  Lower Venous Study Indications: Acute respiratory failure.  Limitations: Right groin arterial line, restricted mobility, patient receiving hemodialysis. Comparison Study: No prior study. Performing Technologist: Maudry Mayhew MHA, RDMS, RVT, RDCS  Examination Guidelines: A complete evaluation includes B-mode imaging, spectral Doppler, color Doppler, and power Doppler as needed of all accessible portions of each vessel. Bilateral testing is considered an integral part of a complete examination. Limited examinations for reoccurring indications may be performed as noted.  +---------+---------------+---------+-----------+----------+------------------+ RIGHT    CompressibilityPhasicitySpontaneityPropertiesSummary            +---------+---------------+---------+-----------+----------+------------------+ CFV                                                   Not visualized     +---------+---------------+---------+-----------+----------+------------------+ SFJ  Not visualized     +---------+---------------+---------+-----------+----------+------------------+ FV Prox                                               Not visualized     +---------+---------------+---------+-----------+----------+------------------+ FV Mid   Full                                                             +---------+---------------+---------+-----------+----------+------------------+ FV DistalFull                                                            +---------+---------------+---------+-----------+----------+------------------+ PFV                                                   Not visualized     +---------+---------------+---------+-----------+----------+------------------+ POP                                                   Not visualized     +---------+---------------+---------+-----------+----------+------------------+ PTV      Full                                         Limited evaluation +---------+---------------+---------+-----------+----------+------------------+ PERO                                                  Not visualized     +---------+---------------+---------+-----------+----------+------------------+   +---------+---------------+---------+-----------+----------+--------------+ LEFT     CompressibilityPhasicitySpontaneityPropertiesSummary        +---------+---------------+---------+-----------+----------+--------------+ CFV      Full           No       Yes                  Pulsatile flow +---------+---------------+---------+-----------+----------+--------------+ SFJ      Full                                                        +---------+---------------+---------+-----------+----------+--------------+ FV Prox  Full                                                        +---------+---------------+---------+-----------+----------+--------------+ FV Mid  Full                                                        +---------+---------------+---------+-----------+----------+--------------+ FV DistalFull                                                        +---------+---------------+---------+-----------+----------+--------------+ PFV                                                    Not visualized +---------+---------------+---------+-----------+----------+--------------+ POP                                                   Not visualized +---------+---------------+---------+-----------+----------+--------------+ PTV                                                   Not visualized +---------+---------------+---------+-----------+----------+--------------+ PERO                                                  Not visualized +---------+---------------+---------+-----------+----------+--------------+     Summary: Right: There is no evidence of deep vein thrombosis in the lower extremity. However, portions of this examination were limited- see technologist comments above. No cystic structure found in the popliteal fossa. Left: There is no evidence of deep vein thrombosis in the lower extremity. However, portions of this examination were limited- see technologist comments above. No cystic structure found in the popliteal fossa. Pulsatile venous flow is suggestive of possible elevated right heart pressure.  *See table(s) above for measurements and observations.    Preliminary    Medications: Infusions: .  prismasol BGK 4/2.5 500 mL/hr at 11/01/18 0714  .  prismasol BGK 4/2.5 400 mL/hr at 10/31/18 2044  . sodium chloride 10 mL/hr at 11/01/18 0400  . amiodarone 30 mg/hr (11/01/18 0607)  . cefTRIAXone (ROCEPHIN)  IV 2 g (10/31/18 2349)  . dexmedetomidine (PRECEDEX) IV infusion    . feeding supplement (VITAL HIGH PROTEIN) 50 mL/hr at 11/01/18 0200  . heparin    . norepinephrine (LEVOPHED) Adult infusion Stopped (10/30/18 0802)  . phenylephrine (NEO-SYNEPHRINE) Adult infusion Stopped (10/31/18 2032)  . prismasol BGK 4/2.5 1,500 mL/hr at 11/01/18 8841  . vancomycin 500 mg (11/01/18 0204)  . vasopressin (PITRESSIN) infusion - *FOR SHOCK* Stopped (10/30/18 2043)    Scheduled Medications: . aspirin EC  81 mg Oral Daily  . B-complex with vitamin C   1 tablet Per Tube Daily  . chlorhexidine gluconate (MEDLINE KIT)  15 mL Mouth Rinse BID  . Chlorhexidine Gluconate Cloth  6 each Topical Q0600  . famotidine  20 mg  Per Tube Daily  . Gerhardt's butt cream   Topical BID  . heparin  5,000 Units Subcutaneous Q8H  . hydrocortisone sod succinate (SOLU-CORTEF) inj  50 mg Intravenous Q6H  . insulin aspart  0-15 Units Subcutaneous Q4H  . mouth rinse  15 mL Mouth Rinse 10 times per day  . mupirocin ointment  1 application Nasal BID  . PARoxetine  20 mg Per Tube Daily  . sodium chloride flush  3 mL Intravenous Q12H  . thiamine  100 mg Per Tube Daily    have reviewed scheduled and prn medications.  Physical Exam: General: intubated now-  sedated Heart: RRR Lungs: CBS bilat Abdomen: soft, non tender Extremities: pitting edema to dep area-  Skin breakdown buttocks and heals Dialysis Access: right IJ vascath placed 8/6    11/01/2018,7:43 AM  LOS: 3 days

## 2018-11-01 NOTE — Progress Notes (Signed)
[facility] for Infectious Disease    Date of Admission:  11/23/2018   Total days of antibiotics         Day 4 vanco/4 ceftriaxone           ID: Kristen Jones is a 68 y.o. female with mrsa/klebsiella bacteremia Principal Problem:   Acute-on-chronic kidney injury ([place]) Active Problems:   T2DM (type 2 diabetes mellitus) ([place])   Depression with anxiety   Hyperkalemia   Delayed union of distal radius fracture, right, closed   AKI (acute kidney injury) ([place])   Pressure injury of skin   Acute encephalopathy   MRSA bacteremia   Bacteremia due to Klebsiella pneumoniae   Acute respiratory failure ([facility])    Subjective: Able to tolerate CRRT, off of pressors since last night. remains hypothermia-has bear hugger in place. Remains intubated. Less responsive per RN report.   cxr showing left sided effusion/afib requiring amiodarone  Medications:  . aspirin EC  81 mg Oral Daily  . B-complex with vitamin C  1 tablet Per Tube Daily  . chlorhexidine gluconate (MEDLINE KIT)  15 mL Mouth Rinse BID  . Chlorhexidine Gluconate Cloth  6 each Topical Q0600  . famotidine  20 mg Per Tube Daily  . Gerhardt's butt cream   Topical BID  . heparin  5,000 Units Subcutaneous Q8H  . hydrocortisone sod succinate (SOLU-CORTEF) inj  50 mg Intravenous Q12H  . insulin aspart  0-15 Units Subcutaneous Q4H  . insulin detemir  10 Units Subcutaneous Daily  . mouth rinse  15 mL Mouth Rinse 10 times per day  . mupirocin ointment  1 application Nasal BID  . PARoxetine  20 mg Per Tube Daily  . sodium chloride flush  3 mL Intravenous Q12H  . thiamine  100 mg Per Tube Daily    Objective: Vital signs in last 24 hours: Temp:  [96 F (35.6 C)-97.8 F (36.6 C)] 97.8 F (36.6 C) (08/09 0748) Pulse Rate:  [115-137] 135 (08/09 1100) Resp:  [19-30] 30 (08/09 1100) SpO2:  [98 %-100 %] 100 % (08/09 1137) Arterial Line BP: (98-142)/(43-64) 121/52 (08/09 1100) FiO2 (%):  [40 %-50 %] 40 % (08/09 1137) Weight:   [59.2 kg] 59.2 kg (08/09 0500) Physical Exam  Constitutional:  Sedated. appears chronically ill and well-nourished. No distress.  HENT: [place]/AT, PERRLA, no scleral icterus. oett in place Neck: right ij in place Cardiovascular: tachy, irreg, irreg. Exam reveals no gallop and no friction rub.  No murmur heard.  Pulmonary/Chest: Effort normal and breath sounds- bilaterally rhonchi Abdominal: Soft. Bowel sounds are decreased.  exhibits no distension. There is no tenderness.  Ext: edema to extremities Skin: Scattered echymosis  Lab Results Recent Labs    10/30/18 0524  10/31/18 1602 11/01/18 0410 11/01/18 0417  WBC 4.5  --   --  8.8  --   HGB 8.0*   < >  --  8.3* 11.6*  HCT 23.3*   < >  --  23.7* 34.0*  NA 132*   < > 136 136 137  K 5.4*   < > 4.1 4.0 4.0  CL 101   < > 98 99  --   CO2 11*   < > 25 24  --   BUN 94*   < > 23 21  --   CREATININE 2.49*   < > 0.99 0.79  --    < > = values in this interval not displayed.   Liver Panel Recent Labs  11/22/2018 2229  10/31/18 1602 11/01/18 0410  PROT 4.3*  --   --   --   ALBUMIN 1.3*   < > 1.0* <1.0*  AST 100*  --   --   --   ALT 65*  --   --   --   ALKPHOS 108  --   --   --   BILITOT 2.4*  --   --   --   BILIDIR 1.3*  --   --   --   IBILI 1.1*  --   --   --    < > = values in this interval not displayed.    Microbiology: 8/6 blood cx MRSA and with bottle with kleb pneumonaie 8/7 blood cx NGTD Studies/Results: Dg Chest Port 1 View  Result Date: 11/01/2018 CLINICAL DATA:  Respiratory failure EXAM: PORTABLE CHEST 1 VIEW COMPARISON:  10/30/2018 chest radiograph. FINDINGS: Endotracheal tube tip is 2.0 cm above the carina. Enteric tube terminates in the body of the stomach. Right internal jugular central venous catheter terminates in the middle third of the SVC. Stable cardiomediastinal silhouette with mild cardiomegaly. No pneumothorax. Moderate left pleural effusion is increased. No right pleural effusion. Patchy opacity throughout  the left lung is increased. Clear right lung. IMPRESSION: 1. Well-positioned support structures. 2. Moderate left pleural effusion is increased. 3. Patchy left lung opacity is increased and could represent atelectasis and/or pneumonia. Electronically Signed   By: Ilona Sorrel M.D.   On: 11/01/2018 08:24   Portable Chest X-ray  Result Date: 10/30/2018 CLINICAL DATA:  Post intubation and NG tube placement EXAM: PORTABLE CHEST 1 VIEW COMPARISON:  October 29, 2018 FINDINGS: Endotracheal tube tip is seen 1.3 cm above the level of carina. NG tube is seen projecting over the stomach. A right-sided central venous catheter is seen with the tip at the superior cavoatrial junction. There is a small left pleural effusion with hazy airspace opacity layering within the left lower lung. The right lung is clear. No pneumothorax. The cardiomediastinal silhouette is unchanged. No acute osseous abnormality. IMPRESSION: 1. ET tube and NG tube in satisfactory position 2. Small left pleural effusion with adjacent layering effusions/atelectasis. Electronically Signed   By: Prudencio Pair M.D.   On: 10/30/2018 15:27   TTE:  Left Ventricle: The left ventricle has moderately reduced systolic function, with an ejection fraction of 35-40%. The cavity size was normal. There is moderately increased left ventricular wall thickness. Left ventricular diastolic Doppler parameters  are indeterminate. Left ventricular diffuse hypokinesis.  Right Ventricle: The right ventricle has mildly reduced systolic function. The cavity was normal. There is no increase in right ventricular wall thickness.  Left Atrium: Left atrial size was mildly dilated.  Right Atrium: Right atrial size was normal in size.  Interatrial Septum: No atrial level shunt detected by color flow Doppler.  Pericardium: There is no evidence of pericardial effusion.  Mitral Valve: The mitral valve is normal in structure. Mild calcification of the mitral valve leaflet.  There is mild mitral annular calcification present. Mitral valve regurgitation is trivial by color flow Doppler. No evidence of mitral valve stenosis.  Tricuspid Valve: The tricuspid valve is normal in structure. Tricuspid valve regurgitation is trivial by color flow Doppler.  Aortic Valve: The aortic valve is tricuspid Mild calcification of the aortic valve. Aortic valve regurgitation is trivial by color flow Doppler. There is No stenosis of the aortic valve.  Pulmonic Valve: The pulmonic valve was normal in structure. Pulmonic valve regurgitation is not  visualized by color flow Doppler. No evidence of pulmonic stenosis.   Assessment/Plan: Polymicrobial (MRSA & Klebsiella) bacteremia = Continue on vancomycin plus ceftriaxone. Bacteremia has cleared on repeat culture.   -agree with getting chest CT to evaluate if effusion will need IR drain/chest CT vs pigtail catheter. If drain, please send fluid for cell count and culture -TTE does not suggest any vegetation. Likely to treat as complicated bacteremia   [facility] Infectious Diseases Cell: [phone] Pager: [phone]  11/01/2018, 12:07 PM

## 2018-11-01 NOTE — Progress Notes (Signed)
CT head reviewed - possible stroke versus mass.  Will order MRI brain.  If findings significant, then will ask neurology to see.  CT chest shows Lt lung consolidation and loculated Lt effusion.  Not surgical candidate given overall status at this time, so will defer TCTS assessment for now.  Will ask IR to review and assess for pigtail catheter placement.  Chesley Mires, MD [facility] Pulmonary/Critical Care 11/01/2018, 4:20 PM

## 2018-11-01 NOTE — Progress Notes (Signed)
Patient RR 40's. Afib in the 140-150's. PRN fentanyl administered. SBP dropped in the 80's. Made CCM doc on call aware. Gave orders to restart phenylephrine.

## 2018-11-01 NOTE — Progress Notes (Signed)
NAME:  HAIDEN TOPPIN, MRN:  [id number], DOB:  1950-11-30, LOS: 3 ADMISSION DATE:  11/02/2018, CONSULTATION DATE:  11/16/2018 REFERRING MD:  Betsey Holiday, ER, CHIEF COMPLAINT:  Fall  Brief History   68 yo female fell at home and had Rt wrist fx.  Found to have hypotension from sepsis and AKI with hyperkalemia.  Past Medical History  DM, HTN, HLD, COPD, Anxiety/depression, combined CHF, CAD, CKD 3  Significant Hospital Events   8/06 Admit, start CRRT 8/07 VDRF 8/09 off pressors  Consults:  Nephrology  ID  Procedures:  A lin 8/6 >> Rt IJ HD cath 8/6 >> ETT 8/7>>   Significant Diagnostic Tests:  LP 8/7 >> RBC 1, WBC 0, glucose 95, protein 25 EEG 8/7 >> diffuse encephalopathy CT head 8/7 >> no acute findings Echo 8/7 >> EF 35 to 40% CT chest 8/09 >> CT head 8/09 >>   Micro Data:  COVID 8/6 >> negative Blood 8/6 >> Klebsiella, MRSA CSF 8/7 >>  CSF HSV 8/7 >> negative Blood 8/07 >>   Antimicrobials:  Vancomycin 8/6 >> Rocephin 8/6 >>   Interim history/subjective:  Remains on full vent support, amiodarone, CRRT.  Hasn't received sedation since 8/07.  Objective   Blood pressure (!) 95/48, pulse (!) 130, temperature 97.8 F (36.6 C), temperature source Axillary, resp. rate (!) 29, height 4\' 10"  (1.473 m), weight 59.2 kg, SpO2 100 %.    Vent Mode: PRVC FiO2 (%):  [40 %-50 %] 40 % Set Rate:  [16 bmp] 16 bmp Vt Set:  [320 mL] 320 mL PEEP:  [5 cmH20] 5 cmH20   Intake/Output Summary (Last 24 hours) at 11/01/2018 1014 Last data filed at 11/01/2018 1000 Gross per 24 hour  Intake 2488.65 ml  Output 2467 ml  Net 21.65 ml   Filed Weights   10/30/18 0300 10/31/18 0416 11/01/18 0500  Weight: 59.3 kg 59.5 kg 59.2 kg    Examination:  General - on vent, CRRT Eyes - pupils pinpoint ENT - ETT in place Cardiac - irregular, tachycardic Chest - decreased BS on Lt Abdomen - soft, non tender, + bowel sounds Extremities - 1+ edema, Rt wrist in wrap Skin - no rashes Neuro - not  following commands  CXR (reviewed by me) - Lt pleural effusion   Resolved Hospital Problem list   Hyperkalemia, Metabolic acidosis  Assessment & Plan:   Acute hypoxic respiratory failure in setting of sepsis, AKI, AMS. Hx of COPD. - full vent support - prn BDs  Lt pleural effusion. - CT chest w/o contrast and then decide about thoracentesis versus IR chest tube placement - if this is complex effusion, don't think she would be surgical candidate for decortication at this time  Septic shock with Klebsiella, MRSA bacteremia. - day 4 of ABx, per ID - wean off solu cortef as able  AKI from ATN. CKD 3. - transition off CRRT 8/09 and monitor renal fx/urine outpt  A fib with RVR. Acute on chronic combined CHF. Hx of HTN, HLD. - continue amiodarone gtt, ASA - hold outpt norvasc, coreg, zetia, lasix, labetalol, lisinopril, crestor  Acute metabolic encephalopathy. Chronic pain. Hx of depression. - RASS goal 0 - repeat CT head 8/09 - continue paxil - hold outpt naprosyn, oxycodone, norco  DM type II. - SSI - add levemir 10 units daily 8/09 - hold outpt metformin  Moderate protein calorie malnutrition. - tube feeds  Anemia of critical illness and chronic disease. - f/u CBC - transfuse for Hb < 7  or significant bleeding  Rt wrist fx. - f/u with orthopedics when medically stable  Hypophosphatemia. - replace as needed   Best practice:  Diet: tube feeds DVT prophylaxis: SQ heparin GI prophylaxis: pepcid Mobility: bed rest Code Status: full code Disposition: ICU  Labs    CMP Latest Ref Rng & Units 11/01/2018 11/01/2018 10/31/2018  Glucose 70 - 99 mg/dL - 282(H) 254(H)  BUN 8 - 23 mg/dL - 21 23  Creatinine 0.44 - 1.00 mg/dL - 0.79 0.99  Sodium 135 - 145 mmol/L 137 136 136  Potassium 3.5 - 5.1 mmol/L 4.0 4.0 4.1  Chloride 98 - 111 mmol/L - 99 98  CO2 22 - 32 mmol/L - 24 25  Calcium 8.9 - 10.3 mg/dL - 7.3(L) 7.1(L)  Total Protein 6.5 - 8.1 g/dL - - -  Total  Bilirubin 0.3 - 1.2 mg/dL - - -  Alkaline Phos 38 - 126 U/L - - -  AST 15 - 41 U/L - - -  ALT 0 - 44 U/L - - -   CBC Latest Ref Rng & Units 11/01/2018 11/01/2018 10/30/2018  WBC 4.0 - 10.5 K/uL - 8.8 -  Hemoglobin 12.0 - 15.0 g/dL 11.6(L) 8.3(L) 8.5(L)  Hematocrit 36.0 - 46.0 % 34.0(L) 23.7(L) 25.0(L)  Platelets 150 - 400 K/uL - PLATELET CLUMPS NOTED ON SMEAR, UNABLE TO ESTIMATE -   ABG    Component Value Date/Time   PHART 7.486 (H) 11/01/2018 0417   PCO2ART 34.5 11/01/2018 0417   PO2ART 66.0 (L) 11/01/2018 0417   HCO3 26.4 11/01/2018 0417   TCO2 27 11/01/2018 0417   ACIDBASEDEF 5.0 (H) 10/30/2018 1122   O2SAT 95.0 11/01/2018 0417   CBG (last 3)  Recent Labs    10/31/18 2334 11/01/18 0408 11/01/18 0752  GLUCAP 208* 254* 193*    CC time 33 minutes  Chesley Mires, MD [place] 11/01/2018, 10:14 AM

## 2018-11-01 NOTE — Progress Notes (Signed)
Patient transported to CT and back without complications. RN at bedside.  

## 2018-11-02 ENCOUNTER — Inpatient Hospital Stay (HOSPITAL_COMMUNITY): Payer: Medicare Other

## 2018-11-02 DIAGNOSIS — Z95828 Presence of other vascular implants and grafts: Secondary | ICD-10-CM

## 2018-11-02 DIAGNOSIS — J90 Pleural effusion, not elsewhere classified: Secondary | ICD-10-CM

## 2018-11-02 DIAGNOSIS — Z9911 Dependence on respirator [ventilator] status: Secondary | ICD-10-CM

## 2018-11-02 DIAGNOSIS — J869 Pyothorax without fistula: Secondary | ICD-10-CM

## 2018-11-02 DIAGNOSIS — Z992 Dependence on renal dialysis: Secondary | ICD-10-CM

## 2018-11-02 DIAGNOSIS — I509 Heart failure, unspecified: Secondary | ICD-10-CM

## 2018-11-02 LAB — RENAL FUNCTION PANEL
Albumin: <1 g/dL — ABNORMAL LOW (ref 3.5–5.0)
Albumin: <1 g/dL — ABNORMAL LOW (ref 3.5–5.0)
Anion gap: 14 (ref 5–15)
Anion gap: 16 — ABNORMAL HIGH (ref 5–15)
BUN: 48 mg/dL — ABNORMAL HIGH (ref 8–23)
BUN: 70 mg/dL — ABNORMAL HIGH (ref 8–23)
CO2: 22 mmol/L (ref 22–32)
CO2: 23 mmol/L (ref 22–32)
Calcium: 7.5 mg/dL — ABNORMAL LOW (ref 8.9–10.3)
Calcium: 7.3 mg/dL — ABNORMAL LOW (ref 8.9–10.3)
Chloride: 100 mmol/L (ref 98–111)
Chloride: 98 mmol/L (ref 98–111)
Creatinine, Ser: 2.02 mg/dL — ABNORMAL HIGH (ref 0.44–1.00)
Creatinine, Ser: 2.14 mg/dL — ABNORMAL HIGH (ref 0.44–1.00)
GFR calc Af Amer: 29 mL/min — ABNORMAL LOW (ref >60)
GFR calc Af Amer: 27 mL/min — ABNORMAL LOW (ref >60)
GFR calc non Af Amer: 25 mL/min — ABNORMAL LOW (ref >60)
GFR calc non Af Amer: 24 mL/min — ABNORMAL LOW (ref >60)
Glucose, Bld: 255 mg/dL — ABNORMAL HIGH (ref 70–99)
Glucose, Bld: 301 mg/dL — ABNORMAL HIGH (ref 70–99)
Phosphorus: 2.8 mg/dL (ref 2.5–4.6)
Phosphorus: 5.7 mg/dL — ABNORMAL HIGH (ref 2.5–4.6)
Potassium: 5.1 mmol/L (ref 3.5–5.1)
Potassium: 4.5 mmol/L (ref 3.5–5.1)
Sodium: 136 mmol/L (ref 135–145)
Sodium: 137 mmol/L (ref 135–145)

## 2018-11-02 LAB — PROTEIN, PLEURAL OR PERITONEAL FLUID: Total protein, fluid: <3 g/dL

## 2018-11-02 LAB — POCT I-STAT 7, (LYTES, BLD GAS, ICA,H+H)
Bicarbonate: 23.5 mmol/L (ref 20.0–28.0)
Calcium, Ion: 1.07 mmol/L — ABNORMAL LOW (ref 1.15–1.40)
HCT: 24 % — ABNORMAL LOW (ref 36.0–46.0)
Hemoglobin: 8.2 g/dL — ABNORMAL LOW (ref 12.0–15.0)
O2 Saturation: 90 %
Patient temperature: 102.2
Potassium: 5.1 mmol/L (ref 3.5–5.1)
Sodium: 136 mmol/L (ref 135–145)
TCO2: 24 mmol/L (ref 22–32)
pCO2 arterial: 34.3 mmHg (ref 32.0–48.0)
pH, Arterial: 7.452 — ABNORMAL HIGH (ref 7.350–7.450)
pO2, Arterial: 62 mmHg — ABNORMAL LOW (ref 83.0–108.0)

## 2018-11-02 LAB — GLUCOSE, CAPILLARY
Glucose-Capillary: 205 mg/dL — ABNORMAL HIGH (ref 70–99)
Glucose-Capillary: 220 mg/dL — ABNORMAL HIGH (ref 70–99)
Glucose-Capillary: 229 mg/dL — ABNORMAL HIGH (ref 70–99)
Glucose-Capillary: 251 mg/dL — ABNORMAL HIGH (ref 70–99)
Glucose-Capillary: 256 mg/dL — ABNORMAL HIGH (ref 70–99)
Glucose-Capillary: 302 mg/dL — ABNORMAL HIGH (ref 70–99)

## 2018-11-02 LAB — CBC
HCT: 23.4 % — ABNORMAL LOW (ref 36.0–46.0)
Hemoglobin: 7.9 g/dL — ABNORMAL LOW (ref 12.0–15.0)
MCH: 27.1 pg (ref 26.0–34.0)
MCHC: 33.8 g/dL (ref 30.0–36.0)
MCV: 80.4 fL (ref 80.0–100.0)
Platelets: 34 10*3/uL — ABNORMAL LOW (ref 150–400)
RBC: 2.91 MIL/uL — ABNORMAL LOW (ref 3.87–5.11)
RDW: 17.2 % — ABNORMAL HIGH (ref 11.5–15.5)
WBC: 13.8 10*3/uL — ABNORMAL HIGH (ref 4.0–10.5)
nRBC: 3.2 % — ABNORMAL HIGH (ref 0.0–0.2)

## 2018-11-02 LAB — BODY FLUID CELL COUNT WITH DIFFERENTIAL
Eos, Fluid: 0 %
Lymphs, Fluid: 3 %
Monocyte-Macrophage-Serous Fluid: 97 % — ABNORMAL HIGH (ref 50–90)
Neutrophil Count, Fluid: 0 % (ref 0–25)
Total Nucleated Cell Count, Fluid: 38750 cu mm — ABNORMAL HIGH (ref 0–1000)

## 2018-11-02 LAB — LACTATE DEHYDROGENASE, PLEURAL OR PERITONEAL FLUID: LD, Fluid: >10000 U/L — ABNORMAL HIGH (ref 3–23)

## 2018-11-02 LAB — IMMUNOFIXATION, URINE

## 2018-11-02 LAB — PHOSPHORUS: Phosphorus: 2.8 mg/dL (ref 2.5–4.6)

## 2018-11-02 LAB — VANCOMYCIN, RANDOM: Vancomycin Rm: 26

## 2018-11-02 MED ORDER — SODIUM PHOSPHATES 45 MMOLE/15ML IV SOLN
20.0000 mmol | Freq: Once | INTRAVENOUS | Status: AC
  Administered 2018-11-02: 09:00:00 20 mmol via INTRAVENOUS
  Filled 2018-11-02: qty 6.67

## 2018-11-02 MED ORDER — VITAMIN B-1 100 MG PO TABS
100.0000 mg | ORAL_TABLET | Freq: Every day | ORAL | Status: DC
  Administered 2018-11-02 – 2018-11-03 (×2): 100 mg
  Filled 2018-11-02 (×3): qty 1

## 2018-11-02 MED ORDER — AMIODARONE HCL 200 MG PO TABS
200.0000 mg | ORAL_TABLET | Freq: Every day | ORAL | Status: DC
  Administered 2018-11-02 – 2018-11-03 (×2): 200 mg via ORAL
  Filled 2018-11-02 (×2): qty 1

## 2018-11-02 MED ORDER — CEFAZOLIN SODIUM-DEXTROSE 1-4 GM/50ML-% IV SOLN: 1.0000 g | INTRAVENOUS | Status: DC

## 2018-11-02 MED ORDER — SODIUM CHLORIDE 0.9 % IV SOLN
2.0000 g | Freq: Two times a day (BID) | INTRAVENOUS | Status: DC
  Administered 2018-11-02 – 2018-11-03 (×3): 2 g via INTRAVENOUS
  Filled 2018-11-02 (×3): qty 20

## 2018-11-02 MED ORDER — ACETAMINOPHEN 160 MG/5ML PO SOLN
650.0000 mg | Freq: Four times a day (QID) | ORAL | Status: DC | PRN
  Administered 2018-11-02 – 2018-11-03 (×3): 650 mg via ORAL
  Filled 2018-11-02 (×3): qty 20.3

## 2018-11-02 MED ORDER — VANCOMYCIN VARIABLE DOSE PER UNSTABLE RENAL FUNCTION (PHARMACIST DOSING): Status: DC

## 2018-11-02 NOTE — Progress Notes (Signed)
Social work consult placed in order to decide a legal guardian or someone who can make decisions for patient. Patient made a DNR today. We were able to get consent from brother, Hurman Horn, to place chest tube. The only person calling for updates is Verdie Drown. Apparently Kristen Jones is the primary caregiver for the patient. There is a need to make decisions for patient during this hospitalization, however there is conflicting stories amongst family member and caregiver. Attending MD aware of situation, and charge RN too.   At this time, we are not giving info to [place] [phone]
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
## 2018-11-02 NOTE — Progress Notes (Signed)
Received a called from radiology in relation to recent chest xray. Radiologist recommends ET be pulled back 2 cm. RRT notified along with Georgann Housekeeper, NP. Also states notable fluid evacuation from recent chest xrays after placement of chest tube.

## 2018-11-02 NOTE — Progress Notes (Signed)
Request to IR for possible left pigtail chest tube placement due to multiloculated left pleural effusion. Patient chart and imaging reviewed by Dr. Anselm Pancoast today who recommends left diagnostic and therapeutic thoracentesis to assess etiology of pleural effusion and consider pigtail placement after this is resulted (if effusion re-accumulates or is unable to be removed by thoracentesis).   Per radiology protocol COVID test required within 72H of any aerosol generating procedure - I have placed an order for this to be collected. I have also placed an order for thoracentesis with labs which will be performed after COVID test is resulted.   Please call IR with questions or concerns.  Candiss Norse, PA-C

## 2018-11-02 NOTE — Progress Notes (Signed)
Patient ID: Kristen Jones, female   DOB: 10-05-1950, 68 y.o.   MRN: [id number] S: Pt with left pleural effusion and s/p diagnostic thoracentesis by PCCM O:BP (!) 133/55   Pulse 85   Temp (!) 100.7 F (38.2 C) (Oral)   Resp (!) 26   Ht '4\' 10"'  (1.473 m)   Wt 60.4 kg   SpO2 100%   BMI 27.83 kg/m   Intake/Output Summary (Last 24 hours) at 11/02/2018 1134 Last data filed at 11/02/2018 0900 Gross per 24 hour  Intake 2205.52 ml  Output 449 ml  Net 1756.52 ml   Intake/Output: I/O last 3 completed shifts: In: 3832.2 [I.V.:1036.9; NG/GT:1710; IV Piggyback:1085.2] Out: 2013 [Urine:102; Other:1911]  Intake/Output this shift:  Total I/O In: 203.7 [I.V.:52.5; NG/GT:150; IV Piggyback:1.1] Out: 30 [Urine:30] Weight change: 1.2 kg Gen: intubated and sedated CVS: no rub Resp: decreased BS at LLL Abd: benign Ext: 1+ edema of upper extremities bilaterally  Recent Labs  Lab 11/09/2018 0533  10/26/2018 2229  10/30/18 0524  10/30/18 1720 10/30/18 2037 10/31/18 0356 10/31/18 1602 11/01/18 0410 11/01/18 0417 11/01/18 1622 11/02/18 0448 11/02/18 0458  NA 123*   < > 131*   < > 132*   < > 133*  --  137 136 136 137 137 136 136  K 7.4*   < > 5.7*   < > 5.4*   < > 4.6  --  4.2 4.1 4.0 4.0 4.5 5.1 5.1  CL 94*   < > 99  --  101  --  93*  --  88* 98 99  --  100 100  --   CO2 12*   < > 18*  --  11*  --  25  --  '30 25 24  ' --  21* 22  --   GLUCOSE 227*   < > 115*  --  169*  --  123*  --  176* 254* 282*  --  262* 255*  --   BUN 126*   < > 118*  --  94*  --  49*  --  27* 23 21  --  25* 48*  --   CREATININE 3.36*   < > 3.08*  --  2.49*  --  1.53*  --  1.18* 0.99 0.79  --  1.14* 2.02*  --   ALBUMIN 2.2*   < > 1.3*  --  1.2*  --  1.1*  --  1.1* 1.0* <1.0*  --  <1.0* <1.0*  --   CALCIUM 9.0   < > 7.4*  --  6.9*  --  6.4*  --  6.4* 7.1* 7.3*  --  7.5* 7.5*  --   PHOS  --    < > 5.9*  --  5.6*  --  3.9 3.7 2.6  2.6 1.7* 1.5*  1.5*  --  2.5 2.8  2.8  --   AST 78*  --  100*  --   --   --   --   --   --    --   --   --   --   --   --   ALT 79*  --  65*  --   --   --   --   --   --   --   --   --   --   --   --    < > = values in this interval not displayed.   Liver Function Tests: Recent Labs  Lab 11/15/2018 0533  11/05/2018 2229  11/01/18 0410 11/01/18 1622 11/02/18 0448  AST 78*  --  100*  --   --   --   --   ALT 79*  --  65*  --   --   --   --   ALKPHOS 126  --  108  --   --   --   --   BILITOT 3.4*  --  2.4*  --   --   --   --   PROT 6.5  --  4.3*  --   --   --   --   ALBUMIN 2.2*   < > 1.3*   < > <1.0* <1.0* <1.0*   < > = values in this interval not displayed.   No results for input(s): LIPASE, AMYLASE in the last 168 hours. Recent Labs  Lab 11/04/2018 2251  AMMONIA 16   CBC: Recent Labs  Lab 10/28/2018 0533  10/28/2018 2229  10/30/18 0524  11/01/18 0410 11/01/18 0417 11/02/18 0448 11/02/18 0458  WBC 9.2  --  2.9*  --  4.5  --  8.8  --  13.8*  --   NEUTROABS 8.5*  --   --   --   --   --   --   --   --   --   HGB 9.7*   < > 7.9*   < > 8.0*   < > 8.3* 11.6* 7.9* 8.2*  HCT 29.6*   < > 22.4*   < > 23.3*   < > 23.7* 34.0* 23.4* 24.0*  MCV 86.0  --  81.2  --  80.9  --  80.6  --  80.4  --   PLT 238  --  144*  --  165  --  PLATELET CLUMPS NOTED ON SMEAR, UNABLE TO ESTIMATE  --  34*  --    < > = values in this interval not displayed.   Cardiac Enzymes: Recent Labs  Lab 11/10/2018 1424 11/20/2018 2229  CKTOTAL 421* 665*  CKMB 15.2*  --    CBG: Recent Labs  Lab 11/01/18 1548 11/01/18 2007 11/01/18 2336 11/02/18 0500 11/02/18 0720  GLUCAP 262* 182* 205* 229* 251*    Iron Studies: No results for input(s): IRON, TIBC, TRANSFERRIN, FERRITIN in the last 72 hours. Studies/Results: Ct Head Wo Contrast  Result Date: 11/01/2018 CLINICAL DATA:  Initial evaluation for acute altered mental status. EXAM: CT HEAD WITHOUT CONTRAST TECHNIQUE: Contiguous axial images were obtained from the base of the skull through the vertex without intravenous contrast. COMPARISON:  Prior CT from  10/30/2018. FINDINGS: Brain: Age-related cerebral atrophy with mild chronic microvascular ischemic disease again noted. Remote lacunar infarcts noted at the bilateral basal ganglia. There is question of an evolving 1 cm hypodensity involving the right subinsular white matter (series 3, image 15), increased in prominence from previous exam. Additionally, there is an additional approximate 1 cm hypodense the involving the posterior right centrum semi ovale, also increased in prominence (series 3, image 22). Findings could reflect evolving acute to subacute ischemic infarcts. No other definite areas of acute or evolving large vessel ischemia. No acute intracranial hemorrhage. Vascular: No hyperdense vessel. Scattered vascular calcifications noted within the carotid siphons. Skull: Scalp soft tissues and calvarium within normal limits. Sinuses/Orbits: Globes and orbital soft tissues within normal limits. Paranasal sinuses are largely clear. Endotracheal and enteric tubes partially visualized. Bilateral mastoid effusions with fluid seen layering within the nasopharynx, likely related intubation. Other:  None. IMPRESSION: 1. Question approximate 1 cm involving hypodensities involving the right subinsular white matter and right centrum semi ovale, increased in prominence from recent CT, and suspicious for evolving acute to subacute ischemic infarcts. Finding could be further assessed with dedicated brain MRI for further evaluation. 2. No other acute intracranial abnormality identified. 3. Underlying age-related cerebral atrophy with chronic microvascular ischemic disease. Electronically Signed   By: Jeannine Boga M.D.   On: 11/01/2018 15:50   Ct Chest Wo Contrast  Result Date: 11/01/2018 CLINICAL DATA:  Pleural effusion EXAM: CT CHEST WITHOUT CONTRAST TECHNIQUE: Multidetector CT imaging of the chest was performed following the standard protocol without IV contrast. COMPARISON:  None. FINDINGS: Cardiovascular: The  heart is enlarged. There is a small pericardial effusion. The intracardiac blood pool is hypodense relative to the adjacent myocardium consistent with anemia. Coronary artery calcifications are noted. The main pulmonary artery is dilated measuring approximately 3 cm in diameter. Mediastinum/Nodes: --there are prominent mediastinal and hilar lymph nodes. --there are prominent but subcentimeter axillary lymph nodes bilaterally. --No supraclavicular lymphadenopathy. --Normal thyroid gland. --there is an enteric tube that extends through the esophagus and terminates below the GE junction. The tip is not visualized. Lungs/Pleura: There is a large likely loculated complex a. Left-sided pleural effusion. There is compressive atelectasis involving much of the left lung field with scattered areas of consolidation. There is some atelectasis involving the right lung base. There is no pneumothorax. No significant right-sided pleural effusion. Evaluation is limited by motion artifact. The endotracheal tube terminates above the carina. Upper Abdomen: No acute abnormality. Musculoskeletal: No chest wall abnormality. No acute or significant osseous findings. Review of the MIP images confirms the above findings. IMPRESSION: 1. Large complex likely multiloculated left-sided pleural effusion with adjacent atelectasis/consolidation. 2. Small amount of atelectasis at the right lung base, otherwise the right lung field is essentially clear. 3. Cardiomegaly. 4. Mildly dilated main pulmonary artery which can be seen in patients with elevated PA pressures. 5. Mediastinal and hilar adenopathy, presumably reactive in etiology. 6. Anemia Aortic Atherosclerosis (ICD10-I70.0). Electronically Signed   By: Constance Holster M.D.   On: 11/01/2018 18:04   Mr Brain Wo Contrast  Result Date: 11/02/2018 CLINICAL DATA:  Altered mental status with abnormal CT EXAM: MRI HEAD WITHOUT CONTRAST TECHNIQUE: Multiplanar, multiecho pulse sequences of the  brain and surrounding structures were obtained without intravenous contrast. COMPARISON:  CT from yesterday FINDINGS: Brain: Multiple 1 cm or smaller infarcts in the bilateral cerebral white matter extending to the subcortical brain and occasionally to the cortex of the cerebral convexities. No infratentorial or deep gray nucleus infarct. These infarcts are seen in both the anterior and posterior circulation. No acute hemorrhage, hydrocephalus, collection, or masslike finding Vascular: Major flow voids are preserved Skull and upper cervical spine: Negative for marrow lesion Sinuses/Orbits: Partial bilateral mastoid opacification. Essentially negative nasopharynx. Other: Asymmetric expansion of the right parotid gland withadjacent fat stranding. IMPRESSION: 1. 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 2. Right parotiditis which may explain the new white count. Electronically Signed   By: Monte Fantasia M.D.   On: 11/02/2018 06:39   Dg Chest Port 1 View  Result Date: 11/01/2018 CLINICAL DATA:  Respiratory failure EXAM: PORTABLE CHEST 1 VIEW COMPARISON:  10/30/2018 chest radiograph. FINDINGS: Endotracheal tube tip is 2.0 cm above the carina. Enteric tube terminates in the body of the stomach. Right internal jugular central venous catheter terminates in the middle third of the SVC. Stable cardiomediastinal silhouette with mild cardiomegaly.  No pneumothorax. Moderate left pleural effusion is increased. No right pleural effusion. Patchy opacity throughout the left lung is increased. Clear right lung. IMPRESSION: 1. Well-positioned support structures. 2. Moderate left pleural effusion is increased. 3. Patchy left lung opacity is increased and could represent atelectasis and/or pneumonia. Electronically Signed   By: Ilona Sorrel M.D.   On: 11/01/2018 08:24   . amiodarone  200 mg Oral Daily  . aspirin EC  81 mg Oral Daily  . B-complex with vitamin C  1 tablet Per Tube Daily  .  chlorhexidine gluconate (MEDLINE KIT)  15 mL Mouth Rinse BID  . Chlorhexidine Gluconate Cloth  6 each Topical Q0600  . famotidine  20 mg Per Tube Daily  . Gerhardt's butt cream   Topical BID  . insulin aspart  0-15 Units Subcutaneous Q4H  . insulin detemir  10 Units Subcutaneous Daily  . mouth rinse  15 mL Mouth Rinse 10 times per day  . mupirocin ointment  1 application Nasal BID  . PARoxetine  20 mg Per Tube Daily  . sodium chloride flush  3 mL Intravenous Q12H  . thiamine  100 mg Per Tube Daily    BMET    Component Value Date/Time   NA 136 11/02/2018 0458   K 5.1 11/02/2018 0458   CL 100 11/02/2018 0448   CO2 22 11/02/2018 0448   GLUCOSE 255 (H) 11/02/2018 0448   BUN 48 (H) 11/02/2018 0448   CREATININE 2.02 (H) 11/02/2018 0448   CALCIUM 7.5 (L) 11/02/2018 0448   CALCIUM 8.7 02/05/2018 0214   GFRNONAA 25 (L) 11/02/2018 0448   GFRAA 29 (L) 11/02/2018 0448   CBC    Component Value Date/Time   WBC 13.8 (H) 11/02/2018 0448   RBC 2.91 (L) 11/02/2018 0448   HGB 8.2 (L) 11/02/2018 0458   HCT 24.0 (L) 11/02/2018 0458   PLT 34 (L) 11/02/2018 0448   MCV 80.4 11/02/2018 0448   MCH 27.1 11/02/2018 0448   MCHC 33.8 11/02/2018 0448   RDW 17.2 (H) 11/02/2018 0448   LYMPHSABS 0.2 (L) 11/11/2018 0533   MONOABS 0.3 11/09/2018 0533   EOSABS 0.1 11/23/2018 0533   BASOSABS 0.0 11/16/2018 0533   Assessment/Plan: 1.  AKI/CKD stage 3 presumably due to ischemic ATN in setting of hypotension, volume depletion and concomitant ACE inhibition.   1. Continue to hold lisinopril and metformin 2. She required transfer to [facility] for CVVHD which was started on 11/23/2018 and stopped 11/01/18 3. Remains oliguric and will likely require IHD for tomorrow. 2. Hyperkalemia - improved with CVVHD but now starting to rise again 3. 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  Plan per PCCM and Neuro consult 4. MRSA/Klebsiella bacteremia/sepsis- possibly related to left sided pneumonia and  loculated fluid.  ID following. 5. Metabolic acidosis- due to #1 and continue to hold metformin. 1. Improved with CVVHD 6. Hypotension/sepsis- unclear source.  currently off of pressors. 7. Hyponatremia- improved 8. AMS- pt minimally arousable.  MRI consistent with embolic cva's 9. Anemia- follow and transfuse prn. 10. Falls- s/p wrist fracture recently. 11. Disposition- poor overall prognosis and PCCM discussing with family possible transition to comfort care given her history of FTT and multi-organ failure.  Donetta Potts, MD [employer] [phone]

## 2018-11-02 NOTE — Progress Notes (Signed)
-  Got a hold of brother. Informed consent obtained for chest tube. -Patient has poor functional baseline and significant cognitive limitations.  Her daughter is same way. -Brother checks in on her occasionally but caregiver I suppose Stanton Kidney sees her more often. -We discussed the severity of her illness including multorgan failure and the associated prognosis.  He thinks that in the event of cardiopulmonary arrest she would like to be allowed to pass in peace instead of with shocks and chest compressions. - I informed him that if she does not wake up in next day or so, switch goals of care to comfort would be more appropriate, he is in agreement.  I would still appreciate if SW could confirm that brother is only family with capacity to make decisions for patient.  Brother: Hurman Horn [phone]  15 minutes time spent in advance care planning

## 2018-11-02 NOTE — Procedures (Signed)
Chest Tube Insertion Procedure Note  Indications:  Clinically significant Left pleural Effusion  Pre-operative Diagnosis: Effusion  Post-operative Diagnosis: Effusion  Procedure Details  Informed consent was obtained for the procedure, including sedation.  Risks of lung perforation, hemorrhage, arrhythmia, and adverse drug reaction were discussed.   After sterile skin prep, using standard technique, a 16 French Thal-quick tube was placed in the left lateral 6th rib space.  Findings: None  Estimated Blood Loss:  less than 50 mL         Specimens:  Sent purulent fluid              Complications:  None; patient tolerated the procedure well.         Disposition: ICU - intubated and critically ill.         Condition: stable   Georgann Housekeeper, AGACNP-BC [place] Pager [phone] or [phone]  11/02/2018 10:40 AM

## 2018-11-02 NOTE — Progress Notes (Signed)
Subjective:  Intubated sedated   Antibiotics:  Anti-infectives (From admission, onward)   Start     Dose/Rate Route Frequency Ordered Stop   2018/11/11 0600  ceFAZolin (ANCEF) IVPB 1 g/50 mL premix     1 g 100 mL/hr over 30 Minutes Intravenous Every 24 hours 11/02/18 1211     11/02/18 1209  vancomycin variable dose per unstable renal function (pharmacist dosing)      Does not apply See admin instructions 11/02/18 1209     11/01/18 2200  ceFAZolin (ANCEF) IVPB 2g/100 mL premix  Status:  Discontinued     2 g 200 mL/hr over 30 Minutes Intravenous Every 8 hours 11/01/18 1125 11/02/18 1211   10/31/18 0200  vancomycin (VANCOCIN) 500 mg in sodium chloride 0.9 % 100 mL IVPB  Status:  Discontinued     500 mg 100 mL/hr over 60 Minutes Intravenous Every 24 hours 10/30/18 1301 11/02/18 1209   10/31/18 0000  cefTRIAXone (ROCEPHIN) 2 g in sodium chloride 0.9 % 100 mL IVPB  Status:  Discontinued     2 g 200 mL/hr over 30 Minutes Intravenous Every 24 hours 10/30/18 1340 11/01/18 1125   10/30/18 2200  vancomycin (VANCOCIN) 500 mg in sodium chloride 0.9 % 100 mL IVPB  Status:  Discontinued     500 mg 100 mL/hr over 60 Minutes Intravenous Every 24 hours 10/30/18 0309 10/30/18 1301   10/30/18 1200  ceFEPIme (MAXIPIME) 2 g in sodium chloride 0.9 % 100 mL IVPB  Status:  Discontinued     2 g 200 mL/hr over 30 Minutes Intravenous Every 12 hours 10/30/18 0309 10/30/18 1340   11/15/2018 2215  vancomycin (VANCOCIN) 1,250 mg in sodium chloride 0.9 % 250 mL IVPB     1,250 mg 166.7 mL/hr over 90 Minutes Intravenous  Once 11/21/2018 2204 10/30/18 0109   11/23/2018 2200  ceFEPIme (MAXIPIME) 1 g in sodium chloride 0.9 % 100 mL IVPB  Status:  Discontinued     1 g 200 mL/hr over 30 Minutes Intravenous Every 24 hours 10/25/2018 2158 10/30/18 0309      Medications: Scheduled Meds:  amiodarone  200 mg Oral Daily   aspirin EC  81 mg Oral Daily   B-complex with vitamin C  1 tablet Per Tube Daily    chlorhexidine gluconate (MEDLINE KIT)  15 mL Mouth Rinse BID   Chlorhexidine Gluconate Cloth  6 each Topical Q0600   famotidine  20 mg Per Tube Daily   Gerhardt's butt cream   Topical BID   insulin aspart  0-15 Units Subcutaneous Q4H   insulin detemir  10 Units Subcutaneous Daily   mouth rinse  15 mL Mouth Rinse 10 times per day   mupirocin ointment  1 application Nasal BID   PARoxetine  20 mg Per Tube Daily   sodium chloride flush  3 mL Intravenous Q12H   thiamine  100 mg Per Tube Daily   vancomycin variable dose per unstable renal function (pharmacist dosing)   Does not apply See admin instructions   Continuous Infusions:  sodium chloride Stopped (11/02/18 0858)   [START ON 11-Nov-2018]  ceFAZolin (ANCEF) IV     feeding supplement (VITAL HIGH PROTEIN) 50 mL/hr at 11/02/18 1200   phenylephrine (NEO-SYNEPHRINE) Adult infusion 20 mcg/min (11/02/18 1100)   PRN Meds:.sodium chloride, acetaminophen (TYLENOL) oral liquid 160 mg/5 mL, acetaminophen **OR** acetaminophen, bisacodyl, docusate, fentaNYL (SUBLIMAZE) injection, ipratropium-albuterol, [DISCONTINUED] ondansetron **OR** ondansetron (ZOFRAN) IV, polyethylene glycol, sodium chloride flush  Objective: Weight change: 1.2 kg  Intake/Output Summary (Last 24 hours) at 11/02/2018 1227 Last data filed at 11/02/2018 1200 Gross per 24 hour  Intake 2335.74 ml  Output 1070 ml  Net 1265.74 ml   Blood pressure (!) 110/46, pulse (!) 102, temperature 99 F (37.2 C), temperature source Oral, resp. rate (!) 23, height '4\' 10"'  (1.473 m), weight 60.4 kg, SpO2 100 %. Temp:  [99 F (37.2 C)-100.7 F (38.2 C)] 99 F (37.2 C) (08/10 1200) Pulse Rate:  [85-149] 102 (08/10 1200) Resp:  [21-37] 23 (08/10 1200) BP: (110-153)/(41-62) 110/46 (08/10 1200) SpO2:  [90 %-100 %] 100 % (08/10 1200) Arterial Line BP: (95-276)/(33-53) 114/41 (08/10 1200) FiO2 (%):  [40 %-100 %] 60 % (08/10 1200) Weight:  [60.4 kg] 60.4 kg (08/10 0500)  Physical  Exam: General: Intubated sedated on the ventilator HEENT: anicteric sclera, CVS tachycardic no murmurs gallops rubs heard  chest: , Coarse breath sounds purulent material in chest tube abdomen: soft non-distended,  Extremities: no edema or deformity noted bilaterally Arterial line and central dialysis catheter in place Neuro: nonfocal  CBC:    BMET Recent Labs    11/01/18 1622 11/02/18 0448 11/02/18 0458  NA 137 136 136  K 4.5 5.1 5.1  CL 100 100  --   CO2 21* 22  --   GLUCOSE 262* 255*  --   BUN 25* 48*  --   CREATININE 1.14* 2.02*  --   CALCIUM 7.5* 7.5*  --      Liver Panel  Recent Labs    11/01/18 1622 11/02/18 0448  ALBUMIN <1.0* <1.0*       Sedimentation Rate No results for input(s): ESRSEDRATE in the last 72 hours. C-Reactive Protein No results for input(s): CRP in the last 72 hours.  Micro Results: Recent Results (from the past 720 hour(s))  SARS Coronavirus 2 [facility] order, Performed in [facility] hospital lab) Nasopharyngeal Nasopharyngeal Swab     Status: None   Collection Time: 11/09/2018  5:55 AM   Specimen: Nasopharyngeal Swab  Result Value Ref Range Status   SARS Coronavirus 2 NEGATIVE NEGATIVE Final    Comment: (NOTE) If result is NEGATIVE SARS-CoV-2 target nucleic acids are NOT DETECTED. The SARS-CoV-2 RNA is generally detectable in upper and lower  respiratory specimens during the acute phase of infection. The lowest  concentration of SARS-CoV-2 viral copies this assay can detect is 250  copies / mL. A negative result does not preclude SARS-CoV-2 infection  and should not be used as the sole basis for treatment or other  patient management decisions.  A negative result may occur with  improper specimen collection / handling, submission of specimen other  than nasopharyngeal swab, presence of viral mutation(s) within the  areas targeted by this assay, and inadequate number of viral copies  (<250 copies / mL). A negative result must  be combined with clinical  observations, patient history, and epidemiological information. If result is POSITIVE SARS-CoV-2 target nucleic acids are DETECTED. The SARS-CoV-2 RNA is generally detectable in upper and lower  respiratory specimens dur ing the acute phase of infection.  Positive  results are indicative of active infection with SARS-CoV-2.  Clinical  correlation with patient history and other diagnostic information is  necessary to determine patient infection status.  Positive results do  not rule out bacterial infection or co-infection with other viruses. If result is PRESUMPTIVE POSTIVE SARS-CoV-2 nucleic acids MAY BE PRESENT.   A presumptive positive result was obtained on the submitted  specimen  and confirmed on repeat testing.  While 2019 novel coronavirus  (SARS-CoV-2) nucleic acids may be present in the submitted sample  additional confirmatory testing may be necessary for epidemiological  and / or clinical management purposes  to differentiate between  SARS-CoV-2 and other Sarbecovirus currently known to infect humans.  If clinically indicated additional testing with an alternate test  methodology [phone]) is advised. The SARS-CoV-2 RNA is generally  detectable in upper and lower respiratory sp ecimens during the acute  phase of infection. The expected result is Negative. Fact Sheet for Patients:  StrictlyIdeas.no Fact Sheet for Healthcare Providers: BankingDealers.co.za This test is not yet approved or cleared by the [place] FDA and has been authorized for detection and/or diagnosis of SARS-CoV-2 by FDA under an Emergency Use Authorization (EUA).  This EUA will remain in effect (meaning this test can be used) for the duration of the COVID-19 declaration under Section 564(b)(1) of the Act, 21 U.S.C. section 360bbb-3(b)(1), unless the authorization is terminated or revoked sooner. Performed at [facility], [address], [place], [place] [address]   MRSA PCR Screening     Status: Abnormal   Collection Time: 11/11/2018 12:54 PM   Specimen: Nasopharyngeal  Result Value Ref Range Status   MRSA by PCR POSITIVE (A) NEGATIVE Final    Comment:        The GeneXpert MRSA Assay (FDA approved for NASAL specimens only), is one component of a comprehensive MRSA colonization surveillance program. It is not intended to diagnose MRSA infection nor to guide or monitor treatment for MRSA infections. RESULT CALLED TO, READ BACK BY AND VERIFIED WITH: PROCTER,R ON 11/16/2018 AT 2305 BY LOY,C Performed at [facility], [address], [place], [place] [address]   Culture, blood (routine x 2)     Status: Abnormal   Collection Time: 11/17/2018 10:50 PM   Specimen: BLOOD  Result Value Ref Range Status   Specimen Description BLOOD RIGHT IJ  Final   Special Requests   Final    BOTTLES DRAWN AEROBIC AND ANAEROBIC Blood Culture adequate volume   Culture  Setup Time   Final    GRAM POSITIVE COCCI IN CLUSTERS IN BOTH AEROBIC AND ANAEROBIC BOTTLES GRAM NEGATIVE RODS AEROBIC BOTTLE ONLY CRITICAL RESULT CALLED TO, READ BACK BY AND VERIFIED WITH: A LIPPUCCI,PHARMD AT 1232 10/30/2018 BY L BENFIELD    Culture (A)  Final    KLEBSIELLA PNEUMONIAE STAPHYLOCOCCUS AUREUS FOR ORGANISM 2 SUSCEPTIBILITIES PERFORMED ON PREVIOUS CULTURE WITHIN THE LAST 5 DAYS. Performed at [facility], [place] [address], [place], [place] [address]    Report Status 11/01/2018 FINAL  Final   Organism ID, Bacteria KLEBSIELLA PNEUMONIAE  Final      Susceptibility   Klebsiella pneumoniae - MIC*    AMPICILLIN RESISTANT Resistant     CEFAZOLIN <=4 SENSITIVE Sensitive     CEFEPIME <=1 SENSITIVE Sensitive     CEFTAZIDIME <=1 SENSITIVE Sensitive     CEFTRIAXONE <=1 SENSITIVE Sensitive     CIPROFLOXACIN <=0.25 SENSITIVE Sensitive     GENTAMICIN <=1 SENSITIVE Sensitive     IMIPENEM <=0.25 SENSITIVE Sensitive     TRIMETH/SULFA <=20 SENSITIVE  Sensitive     AMPICILLIN/SULBACTAM 4 SENSITIVE Sensitive     PIP/TAZO <=4 SENSITIVE Sensitive     Extended ESBL NEGATIVE Sensitive     * KLEBSIELLA PNEUMONIAE  Blood Culture ID Panel (Reflexed)     Status: Abnormal   Collection Time: 10/24/2018 10:50 PM  Result Value Ref Range  Status   Enterococcus species NOT DETECTED NOT DETECTED Final   Listeria monocytogenes NOT DETECTED NOT DETECTED Final   Staphylococcus species DETECTED (A) NOT DETECTED Final    Comment: CRITICAL RESULT CALLED TO, READ BACK BY AND VERIFIED WITH: A LIPPUCCI,PHARMD AT 1232 10/30/2018 BY L BENFIELD    Staphylococcus aureus (BCID) DETECTED (A) NOT DETECTED Final    Comment: Methicillin (oxacillin)-resistant Staphylococcus aureus (MRSA). MRSA is predictably resistant to beta-lactam antibiotics (except ceftaroline). Preferred therapy is vancomycin unless clinically contraindicated. Patient requires contact precautions if  hospitalized. CRITICAL RESULT CALLED TO, READ BACK BY AND VERIFIED WITH: A LIPPUCCI,PHARMD AT 1232 10/30/2018 BY L BENFIELD    Methicillin resistance DETECTED (A) NOT DETECTED Final    Comment: CRITICAL RESULT CALLED TO, READ BACK BY AND VERIFIED WITH: A LIPPUCCI,PHARMD AT 1232 10/30/2018 BY L BENFIELD    Streptococcus species NOT DETECTED NOT DETECTED Final   Streptococcus agalactiae NOT DETECTED NOT DETECTED Final   Streptococcus pneumoniae NOT DETECTED NOT DETECTED Final   Streptococcus pyogenes NOT DETECTED NOT DETECTED Final   Acinetobacter baumannii NOT DETECTED NOT DETECTED Final   Enterobacteriaceae species DETECTED (A) NOT DETECTED Final    Comment: Enterobacteriaceae represent a large family of gram-negative bacteria, not a single organism. CRITICAL RESULT CALLED TO, READ BACK BY AND VERIFIED WITH: A LIPPUCCI,PHARMD AT 1232 10/30/2018 BY L BENFIELD    Enterobacter cloacae complex NOT DETECTED NOT DETECTED Final   Escherichia coli NOT DETECTED NOT DETECTED Final   Klebsiella oxytoca NOT DETECTED  NOT DETECTED Final   Klebsiella pneumoniae DETECTED (A) NOT DETECTED Final    Comment: CRITICAL RESULT CALLED TO, READ BACK BY AND VERIFIED WITH: A LIPPUCCI,PHARMD AT 1232 10/30/2018 BY L BENFIELD    Proteus species NOT DETECTED NOT DETECTED Final   Serratia marcescens NOT DETECTED NOT DETECTED Final   Carbapenem resistance NOT DETECTED NOT DETECTED Final   Haemophilus influenzae NOT DETECTED NOT DETECTED Final   Neisseria meningitidis NOT DETECTED NOT DETECTED Final   Pseudomonas aeruginosa NOT DETECTED NOT DETECTED Final   Candida albicans NOT DETECTED NOT DETECTED Final   Candida glabrata NOT DETECTED NOT DETECTED Final   Candida krusei NOT DETECTED NOT DETECTED Final   Candida parapsilosis NOT DETECTED NOT DETECTED Final   Candida tropicalis NOT DETECTED NOT DETECTED Final    Comment: Performed at [facility], [place]. [address] [place], [place] [address]  Culture, blood (routine x 2)     Status: Abnormal   Collection Time: 11/02/2018 11:15 PM   Specimen: BLOOD  Result Value Ref Range Status   Specimen Description BLOOD RIGHT FEM LINE  Final   Special Requests   Final    BOTTLES DRAWN AEROBIC AND ANAEROBIC Blood Culture adequate volume   Culture  Setup Time   Final    GRAM POSITIVE COCCI IN BOTH AEROBIC AND ANAEROBIC BOTTLES CRITICAL VALUE NOTED.  VALUE IS CONSISTENT WITH PREVIOUSLY REPORTED AND CALLED VALUE. Performed at [facility], [place] [address] [place], [place] [address]    Culture METHICILLIN RESISTANT STAPHYLOCOCCUS AUREUS (A)  Final   Report Status 11/01/2018 FINAL  Final   Organism ID, Bacteria METHICILLIN RESISTANT STAPHYLOCOCCUS AUREUS  Final      Susceptibility   Methicillin resistant staphylococcus aureus - MIC*    CIPROFLOXACIN <=0.5 SENSITIVE Sensitive     ERYTHROMYCIN <=0.25 SENSITIVE Sensitive     GENTAMICIN <=0.5 SENSITIVE Sensitive     OXACILLIN >=4 RESISTANT Resistant     TETRACYCLINE <=1 SENSITIVE Sensitive  VANCOMYCIN 1 SENSITIVE Sensitive      TRIMETH/SULFA <=10 SENSITIVE Sensitive     CLINDAMYCIN <=0.25 SENSITIVE Sensitive     RIFAMPIN <=0.5 SENSITIVE Sensitive     Inducible Clindamycin NEGATIVE Sensitive     * METHICILLIN RESISTANT STAPHYLOCOCCUS AUREUS  CSF culture     Status: None (Preliminary result)   Collection Time: 10/30/18  4:28 PM   Specimen: CSF; Cerebrospinal Fluid  Result Value Ref Range Status   Specimen Description CSF  Final   Special Requests NONE  Final   Gram Stain   Final    WBC PRESENT, PREDOMINANTLY PMN NO ORGANISMS SEEN CYTOSPIN SMEAR    Culture   Final    NO GROWTH 3 DAYS Performed at [facility], [place] [address], [place], [place] [address]    Report Status PENDING  Incomplete  Culture, blood (Routine X 2) w Reflex to ID Panel     Status: None (Preliminary result)   Collection Time: 10/30/18  6:20 PM   Specimen: BLOOD RIGHT HAND  Result Value Ref Range Status   Specimen Description BLOOD RIGHT HAND  Final   Special Requests   Final    BOTTLES DRAWN AEROBIC ONLY Blood Culture adequate volume   Culture   Final    NO GROWTH 3 DAYS Performed at [facility], [place] [address], [place], [place] [address]    Report Status PENDING  Incomplete  Culture, blood (Routine X 2) w Reflex to ID Panel     Status: None (Preliminary result)   Collection Time: 10/30/18  6:25 PM   Specimen: BLOOD  Result Value Ref Range Status   Specimen Description BLOOD RIGHT WRIST  Final   Special Requests   Final    BOTTLES DRAWN AEROBIC ONLY Blood Culture results may not be optimal due to an inadequate volume of blood received in culture bottles   Culture  Setup Time   Final    GRAM POSITIVE COCCI IN CLUSTERS AEROBIC BOTTLE ONLY CRITICAL VALUE NOTED.  VALUE IS CONSISTENT WITH PREVIOUSLY REPORTED AND CALLED VALUE. Performed at [facility], [place] [address], [place], [place] [address]    Culture [facility] POSITIVE COCCI  Final   Report Status PENDING  Incomplete    Studies/Results: Ct Head Wo  Contrast  Result Date: 11/01/2018 CLINICAL DATA:  Initial evaluation for acute altered mental status. EXAM: CT HEAD WITHOUT CONTRAST TECHNIQUE: Contiguous axial images were obtained from the base of the skull through the vertex without intravenous contrast. COMPARISON:  Prior CT from 10/30/2018. FINDINGS: Brain: Age-related cerebral atrophy with mild chronic microvascular ischemic disease again noted. Remote lacunar infarcts noted at the bilateral basal ganglia. There is question of an evolving 1 cm hypodensity involving the right subinsular white matter (series 3, image 15), increased in prominence from previous exam. Additionally, there is an additional approximate 1 cm hypodense the involving the posterior right centrum semi ovale, also increased in prominence (series 3, image 22). Findings could reflect evolving acute to subacute ischemic infarcts. No other definite areas of acute or evolving large vessel ischemia. No acute intracranial hemorrhage. Vascular: No hyperdense vessel. Scattered vascular calcifications noted within the carotid siphons. Skull: Scalp soft tissues and calvarium within normal limits. Sinuses/Orbits: Globes and orbital soft tissues within normal limits. Paranasal sinuses are largely clear. Endotracheal and enteric tubes partially visualized. Bilateral mastoid effusions with fluid seen layering within the nasopharynx, likely related intubation. Other: None. IMPRESSION: 1. Question approximate 1 cm involving hypodensities involving the right  subinsular white matter and right centrum semi ovale, increased in prominence from recent CT, and suspicious for evolving acute to subacute ischemic infarcts. Finding could be further assessed with dedicated brain MRI for further evaluation. 2. No other acute intracranial abnormality identified. 3. Underlying age-related cerebral atrophy with chronic microvascular ischemic disease. Electronically Signed   By: Jeannine Boga M.D.   On: 11/01/2018  15:50   Ct Chest Wo Contrast  Result Date: 11/01/2018 CLINICAL DATA:  Pleural effusion EXAM: CT CHEST WITHOUT CONTRAST TECHNIQUE: Multidetector CT imaging of the chest was performed following the standard protocol without IV contrast. COMPARISON:  None. FINDINGS: Cardiovascular: The heart is enlarged. There is a small pericardial effusion. The intracardiac blood pool is hypodense relative to the adjacent myocardium consistent with anemia. Coronary artery calcifications are noted. The main pulmonary artery is dilated measuring approximately 3 cm in diameter. Mediastinum/Nodes: --there are prominent mediastinal and hilar lymph nodes. --there are prominent but subcentimeter axillary lymph nodes bilaterally. --No supraclavicular lymphadenopathy. --Normal thyroid gland. --there is an enteric tube that extends through the esophagus and terminates below the GE junction. The tip is not visualized. Lungs/Pleura: There is a large likely loculated complex a. Left-sided pleural effusion. There is compressive atelectasis involving much of the left lung field with scattered areas of consolidation. There is some atelectasis involving the right lung base. There is no pneumothorax. No significant right-sided pleural effusion. Evaluation is limited by motion artifact. The endotracheal tube terminates above the carina. Upper Abdomen: No acute abnormality. Musculoskeletal: No chest wall abnormality. No acute or significant osseous findings. Review of the MIP images confirms the above findings. IMPRESSION: 1. Large complex likely multiloculated left-sided pleural effusion with adjacent atelectasis/consolidation. 2. Small amount of atelectasis at the right lung base, otherwise the right lung field is essentially clear. 3. Cardiomegaly. 4. Mildly dilated main pulmonary artery which can be seen in patients with elevated PA pressures. 5. Mediastinal and hilar adenopathy, presumably reactive in etiology. 6. Anemia Aortic Atherosclerosis  (ICD10-I70.0). Electronically Signed   By: Constance Holster M.D.   On: 11/01/2018 18:04   Mr Brain Wo Contrast  Result Date: 11/02/2018 CLINICAL DATA:  Altered mental status with abnormal CT EXAM: MRI HEAD WITHOUT CONTRAST TECHNIQUE: Multiplanar, multiecho pulse sequences of the brain and surrounding structures were obtained without intravenous contrast. COMPARISON:  CT from yesterday FINDINGS: Brain: Multiple 1 cm or smaller infarcts in the bilateral cerebral white matter extending to the subcortical brain and occasionally to the cortex of the cerebral convexities. No infratentorial or deep gray nucleus infarct. These infarcts are seen in both the anterior and posterior circulation. No acute hemorrhage, hydrocephalus, collection, or masslike finding Vascular: Major flow voids are preserved Skull and upper cervical spine: Negative for marrow lesion Sinuses/Orbits: Partial bilateral mastoid opacification. Essentially negative nasopharynx. Other: Asymmetric expansion of the right parotid gland withadjacent fat stranding. IMPRESSION: 1. 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 2. Right parotiditis which may explain the new white count. Electronically Signed   By: Monte Fantasia M.D.   On: 11/02/2018 06:39   Dg Chest Port 1 View  Result Date: 11/01/2018 CLINICAL DATA:  Respiratory failure EXAM: PORTABLE CHEST 1 VIEW COMPARISON:  10/30/2018 chest radiograph. FINDINGS: Endotracheal tube tip is 2.0 cm above the carina. Enteric tube terminates in the body of the stomach. Right internal jugular central venous catheter terminates in the middle third of the SVC. Stable cardiomediastinal silhouette with mild cardiomegaly. No pneumothorax. Moderate left pleural effusion is increased. No right pleural effusion.  Patchy opacity throughout the left lung is increased. Clear right lung. IMPRESSION: 1. Well-positioned support structures. 2. Moderate left pleural effusion is increased. 3. Patchy  left lung opacity is increased and could represent atelectasis and/or pneumonia. Electronically Signed   By: Ilona Sorrel M.D.   On: 11/01/2018 08:24      Assessment/Plan:  INTERVAL HISTORY: see below   Principal Problem:   Acute-on-chronic kidney injury ([place]) Active Problems:   T2DM (type 2 diabetes mellitus) ([place])   Depression with anxiety   Hyperkalemia   Delayed union of distal radius fracture, right, closed   AKI (acute kidney injury) ([place])   Pressure injury of skin   Acute encephalopathy   MRSA bacteremia   Bacteremia due to Klebsiella pneumoniae   Acute respiratory failure ([place])   Multi-organ failure with heart failure (H[facility])   Pleural effusion, left    Kristen Jones is a 68 y.o. female with MRSA and Klebsiella pneumonia bacteremia, septic shock multiorgan failure who had been on CVVHD and multiple pressors when I saw her last week now has improved hemodynamically over the weekend off CVVHD.    She underwent lumbar puncture last week which did not show evidence of meningitis by LP.   She did have an MRI concerning for evidence of parotitis and also for emboli in the CNS.  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  She still has a central line in her neck as well as arterial line which are undoubtedly seeded by her polymicrobial bacteremia.   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  Sources and sites of infection include parotid glands and empyema  --continue vancomycin and ceftriaxone  --she will need "holiday" from central lines to have  Any hope of clearing this but pt still getting pressor and w arterial line in place  She should be treated presumptively for endocarditis should she survive    LOS: 4 days   Alcide Evener 11/02/2018, 12:27 PM

## 2018-11-02 NOTE — Progress Notes (Signed)
ET tube pulled back 2 cm per NP order. Tube is now at 18 at the lip. Patient still getting good VT through ventilator. RT will continue to monitor.

## 2018-11-02 NOTE — Progress Notes (Signed)
Patient transferred to MRI and back without complications.

## 2018-11-02 NOTE — Progress Notes (Signed)
Pharmacy Antibiotic Note  Kristen Jones is a 68 y.o. female admitted on 11/18/2018 with MRSA and Klebsiella pneumonia bacteremia, septic shock multiorgan failure. Pharmacy has been consulted for vancomycin dosing. Dr. 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 Sources and sites of infection include parotid glands and empyema. Will need central line holiday but still has pressors running currently.  Vancomycin random level was 26 this AM. Last dose of vancomycin 500mg  IV q24h given 8/10 at 0200.   Pt was on CVVHD 8/6>8/9, now transitioning to IHD - likely will have first session 8/11 per Renal.   Plan: Hold vancomycin for now with high level and change to iHD F/u Renal plans for iHD and enter further vancomycin doses as appropriate D/c cefazolin with CNS emboli per ID >> ceftriaxone 2g IV q12h Monitor clinical progress, c/s, pre-HD vancomycin level as indicated F/u ID plans for LOT   Height: 4\' 10"  (147.3 cm) Weight: 133 lb 2.5 oz (60.4 kg) IBW/kg (Calculated) : 40.9  Temp (24hrs), Avg:99.8 F (37.7 C), Min:99 F (37.2 C), Max:100.7 F (38.2 C)  Recent Labs  Lab 11/13/2018 0533 11/11/2018 0553  11/14/2018 2229 10/30/18 0524  10/31/18 0356 10/31/18 1602 11/01/18 0410 11/01/18 1622 11/02/18 0448  WBC 9.2  --   --  2.9* 4.5  --   --   --  8.8  --  13.8*  CREATININE 3.36*  --    < > 3.08* 2.49*   < > 1.18* 0.99 0.79 1.14* 2.02*  LATICACIDVEN  --  1.9  --  1.9  --   --   --   --   --   --   --   VANCORANDOM  --   --   --   --   --   --   --   --   --   --  26   < > = values in this interval not displayed.    Estimated Creatinine Clearance: 21.3 mL/min (A) (by C-G formula based on SCr of 2.02 mg/dL (H)).    Allergies  Allergen Reactions  . Fluoxetine Hcl     REACTION: UNKNOWN REACTION  . Methocarbamol Nausea And Vomiting  . Metoclopramide Hcl     REACTION: UNKNOWN REACTION  . Motrin [Ibuprofen] Other (See Comments)     REACTION UNKNOWN  . Rabeprazole Sodium     REACTION: UNKNOWN REACTION   Ceftriaxone 8/7>>8/9 Cefazolin 8/9>> Vanc 8/6>> *AM VR 8/10 - 26 Cefepime 8/6>>8/7  CSF 8/7 > ngtd CSF HSV 8/7 > negative Blood 8/7 > GPC 1/2 8/6 covid neg 8/6 blood x 2: Klebsiella and MRSA 8/6 MRSA PCR +   Elicia Lamp, PharmD, BCPS Please check AMION for all [facility] contact numbers Clinical Pharmacist 11/02/2018 12:53 PM

## 2018-11-03 LAB — RENAL FUNCTION PANEL
Albumin: <1 g/dL — ABNORMAL LOW (ref 3.5–5.0)
Anion gap: 17 — ABNORMAL HIGH (ref 5–15)
BUN: 91 mg/dL — ABNORMAL HIGH (ref 8–23)
CO2: 22 mmol/L (ref 22–32)
Calcium: 7.5 mg/dL — ABNORMAL LOW (ref 8.9–10.3)
Chloride: 99 mmol/L (ref 98–111)
Creatinine, Ser: 2.44 mg/dL — ABNORMAL HIGH (ref 0.44–1.00)
GFR calc Af Amer: 23 mL/min — ABNORMAL LOW (ref >60)
GFR calc non Af Amer: 20 mL/min — ABNORMAL LOW (ref >60)
Glucose, Bld: 210 mg/dL — ABNORMAL HIGH (ref 70–99)
Phosphorus: 6.3 mg/dL — ABNORMAL HIGH (ref 2.5–4.6)
Potassium: 4.9 mmol/L (ref 3.5–5.1)
Sodium: 138 mmol/L (ref 135–145)

## 2018-11-03 LAB — CSF CULTURE W GRAM STAIN: Culture: NO GROWTH

## 2018-11-03 LAB — PH, BODY FLUID: pH, Body Fluid: 6.3

## 2018-11-03 LAB — CULTURE, BLOOD (ROUTINE X 2)

## 2018-11-03 LAB — MAGNESIUM: Magnesium: 2.2 mg/dL (ref 1.7–2.4)

## 2018-11-03 LAB — GLUCOSE, CAPILLARY
Glucose-Capillary: 180 mg/dL — ABNORMAL HIGH (ref 70–99)
Glucose-Capillary: 185 mg/dL — ABNORMAL HIGH (ref 70–99)
Glucose-Capillary: 214 mg/dL — ABNORMAL HIGH (ref 70–99)
Glucose-Capillary: 227 mg/dL — ABNORMAL HIGH (ref 70–99)

## 2018-11-03 LAB — PATHOLOGIST SMEAR REVIEW

## 2018-11-03 LAB — PHOSPHORUS: Phosphorus: 6.2 mg/dL — ABNORMAL HIGH (ref 2.5–4.6)

## 2018-11-03 MED ORDER — INSULIN DETEMIR 100 UNIT/ML ~~LOC~~ SOLN
15.0000 [IU] | Freq: Every day | SUBCUTANEOUS | Status: DC
  Administered 2018-11-03: 15 [IU] via SUBCUTANEOUS
  Filled 2018-11-03: qty 0.15

## 2018-11-03 MED ORDER — ACETAMINOPHEN 325 MG PO TABS: 650.0000 mg | ORAL_TABLET | Freq: Four times a day (QID) | ORAL | Status: DC | PRN

## 2018-11-03 MED ORDER — GLYCOPYRROLATE 1 MG PO TABS: 1.0000 mg | ORAL_TABLET | ORAL | Status: DC | PRN

## 2018-11-03 MED ORDER — ALTEPLASE 2 MG IJ SOLR: 2.0000 mg | Freq: Once | INTRAMUSCULAR | Status: DC | PRN

## 2018-11-03 MED ORDER — ACETAMINOPHEN 650 MG RE SUPP: 650.0000 mg | Freq: Four times a day (QID) | RECTAL | Status: DC | PRN

## 2018-11-03 MED ORDER — GLYCOPYRROLATE 0.2 MG/ML IJ SOLN: 0.2000 mg | INTRAMUSCULAR | Status: DC | PRN

## 2018-11-03 MED ORDER — MORPHINE BOLUS VIA INFUSION
5.0000 mg | INTRAVENOUS | Status: DC | PRN
  Administered 2018-11-03: 5 mg via INTRAVENOUS
  Filled 2018-11-03: qty 5

## 2018-11-03 MED ORDER — ALBUMIN HUMAN 5 % IV SOLN
INTRAVENOUS | Status: AC
  Filled 2018-11-03: qty 500

## 2018-11-03 MED ORDER — LIDOCAINE-PRILOCAINE 2.5-2.5 % EX CREA
1.0000 "application " | TOPICAL_CREAM | CUTANEOUS | Status: DC | PRN
  Filled 2018-11-03: qty 5

## 2018-11-03 MED ORDER — DIPHENHYDRAMINE HCL 50 MG/ML IJ SOLN: 25.0000 mg | INTRAMUSCULAR | Status: DC | PRN

## 2018-11-03 MED ORDER — PENTAFLUOROPROP-TETRAFLUOROETH EX AERO: 1.0000 "application " | INHALATION_SPRAY | CUTANEOUS | Status: DC | PRN

## 2018-11-03 MED ORDER — MORPHINE 100MG IN NS 100ML (1MG/ML) PREMIX INFUSION
0.0000 mg/h | INTRAVENOUS | Status: DC
  Administered 2018-11-03: 5 mg/h via INTRAVENOUS
  Filled 2018-11-03: qty 100

## 2018-11-03 MED ORDER — SODIUM CHLORIDE 0.9 % IV SOLN: 100.0000 mL | INTRAVENOUS | Status: DC | PRN

## 2018-11-03 MED ORDER — POLYVINYL ALCOHOL 1.4 % OP SOLN
1.0000 [drp] | Freq: Four times a day (QID) | OPHTHALMIC | Status: DC | PRN
  Filled 2018-11-03: qty 15

## 2018-11-03 MED ORDER — LIDOCAINE HCL (PF) 1 % IJ SOLN: 5.0000 mL | INTRAMUSCULAR | Status: DC | PRN

## 2018-11-03 MED ORDER — HEPARIN SODIUM (PORCINE) 1000 UNIT/ML IJ SOLN
INTRAMUSCULAR | Status: AC
  Filled 2018-11-03: qty 3

## 2018-11-03 MED ORDER — MORPHINE SULFATE (PF) 2 MG/ML IV SOLN
2.0000 mg | INTRAVENOUS | Status: DC | PRN
  Administered 2018-11-03 (×2): 2 mg via INTRAVENOUS

## 2018-11-03 MED ORDER — ALBUMIN HUMAN 25 % IV SOLN
25.0000 g | Freq: Once | INTRAVENOUS | Status: AC
  Administered 2018-11-03: 09:00:00 25 g via INTRAVENOUS
  Filled 2018-11-03: qty 50

## 2018-11-03 MED ORDER — SODIUM CHLORIDE 0.9% IV SOLUTION: Freq: Once | INTRAVENOUS | Status: DC

## 2018-11-03 MED ORDER — CHLORHEXIDINE GLUCONATE CLOTH 2 % EX PADS: 6.0000 | MEDICATED_PAD | Freq: Every day | CUTANEOUS | Status: DC

## 2018-11-03 MED ORDER — HEPARIN 1000 UNIT/ML FOR PERITONEAL DIALYSIS
2400.0000 [IU] | INTRAMUSCULAR | Status: DC | PRN
  Filled 2018-11-03: qty 2.4

## 2018-11-04 LAB — CBC
HCT: 20.7 % — ABNORMAL LOW (ref 36.0–46.0)
Hemoglobin: 7.1 g/dL — ABNORMAL LOW (ref 12.0–15.0)
MCH: 27.4 pg (ref 26.0–34.0)
MCHC: 34.3 g/dL (ref 30.0–36.0)
MCV: 79.9 fL — ABNORMAL LOW (ref 80.0–100.0)
Platelets: 26 10*3/uL — CL (ref 150–400)
RBC: 2.59 MIL/uL — ABNORMAL LOW (ref 3.87–5.11)
RDW: 17.7 % — ABNORMAL HIGH (ref 11.5–15.5)
WBC: 8.2 10*3/uL (ref 4.0–10.5)
nRBC: 3.4 % — ABNORMAL HIGH (ref 0.0–0.2)

## 2018-11-04 LAB — BODY FLUID CULTURE

## 2018-11-04 LAB — CULTURE, BLOOD (ROUTINE X 2)
Culture: NO GROWTH
Special Requests: ADEQUATE

## 2018-11-04 LAB — HEPATITIS B CORE ANTIBODY, TOTAL: Hep B Core Total Ab: POSITIVE — AB

## 2018-11-04 LAB — HEPATITIS B SURFACE ANTIBODY,QUALITATIVE: Hep B S Ab: NONREACTIVE

## 2018-11-04 LAB — HEPATITIS B SURFACE ANTIGEN: Hepatitis B Surface Ag: NEGATIVE

## 2018-11-24 NOTE — Progress Notes (Signed)
Patient ID: Kristen Jones, female   DOB: 06-28-1950, 68 y.o.   MRN: [id number] S: Pt became tachycardic and hypotensive after being started on dialysis today and had to be rinsed back.  I notified PCCM staff that she cannot tolerate IHD and would recommend restarting CVVHD if family does not want to transition to [place] today. O:BP (!) 125/44   Pulse 88   Temp 100.1 F (37.8 C)   Resp 17   Ht 4' 10" (1.473 m)   Wt 61 kg   SpO2 100%   BMI 28.11 kg/m   Intake/Output Summary (Last 24 hours) at 16-Nov-2018 1209 Last data filed at 2018/11/16 1200 Gross per 24 hour  Intake 1540.3 ml  Output 127 ml  Net 1413.3 ml   Intake/Output: I/O last 3 completed shifts: In: 3219 [I.V.:628.2; NG/GT:1900; IV Piggyback:690.8] Out: 1076 [Urine:326; Chest Tube:750]  Intake/Output this shift:  Total I/O In: 118.3 [I.V.:18.3; IV Piggyback:100] Out: -149 [Urine:115] Weight change: 0.3 kg Gen: intubated and sedated CVS: tachy Resp: decreased BS at left base Abd: +BS, soft Ext: +1 upper ext edema, trace pretibial edema  Recent Labs  Lab 10/28/2018 0533  11/13/2018 2229  10/31/18 0356 10/31/18 1602 11/01/18 0410 11/01/18 0417 11/01/18 1622 11/02/18 0448 11/02/18 0458 11/02/18 1549 Nov 16, 2018 0420  NA 123*   < > 131*   < > 137 136 136 137 137 136 136 137 138  K 7.4*   < > 5.7*   < > 4.2 4.1 4.0 4.0 4.5 5.1 5.1 4.5 4.9  CL 94*   < > 99   < > 88* 98 99  --  100 100  --  98 99  CO2 12*   < > 18*   < > _0 --  21* 22  --  23 22  GLUCOSE 227*   < > 115*   < > 176* 254* 282*  --  262* 255*  --  301* 210*  BUN 126*   < > 118*   < > 27* 23 21  --  25* 48*  --  70* 91*  CREATININE 3.36*   < > 3.08*   < > 1.18* 0.99 0.79  --  1.14* 2.02*  --  2.14* 2.44*  ALBUMIN 2.2*   < > 1.3*   < > 1.1* 1.0* <1.0*  --  <1.0* <1.0*  --  <1.0* <1.0*  CALCIUM 9.0   < > 7.4*   < > 6.4* 7.1* 7.3*  --  7.5* 7.5*  --  7.3* 7.5*  PHOS  --    < > 5.9*   < > 2.6  2.6 1.7* 1.5*  1.5*  --  2.5 2.8  2.8  --  5.7* 6.2*  6.3*   AST 78*  --  100*  --   --   --   --   --   --   --   --   --   --   ALT 79*  --  65*  --   --   --   --   --   --   --   --   --   --    < > = values in this interval not displayed.   Liver Function Tests: Recent Labs  Lab 11/22/2018 0533  11/23/2018 2229  11/02/18 0448 11/02/18 1549 16-Nov-2018 0420  AST 78*  --  100*  --   --   --   --   ALT  79*  --  65*  --   --   --   --   ALKPHOS 126  --  108  --   --   --   --   BILITOT 3.4*  --  2.4*  --   --   --   --   PROT 6.5  --  4.3*  --   --   --   --   ALBUMIN 2.2*   < > 1.3*   < > <1.0* <1.0* <1.0*   < > = values in this interval not displayed.   No results for input(s): LIPASE, AMYLASE in the last 168 hours. Recent Labs  Lab 11/20/2018 2251  AMMONIA 16   CBC: Recent Labs  Lab 11/07/2018 0533  11/17/2018 2229  10/30/18 0524  11/01/18 0410  11/02/18 0448 11/02/18 0458 11/23/18 0420  WBC 9.2  --  2.9*  --  4.5  --  8.8  --  13.8*  --  8.2  NEUTROABS 8.5*  --   --   --   --   --   --   --   --   --   --   HGB 9.7*   < > 7.9*   < > 8.0*   < > 8.3*   < > 7.9* 8.2* 7.1*  HCT 29.6*   < > 22.4*   < > 23.3*   < > 23.7*   < > 23.4* 24.0* 20.7*  MCV 86.0  --  81.2  --  80.9  --  80.6  --  80.4  --  79.9*  PLT 238  --  144*  --  165  --  PLATELET CLUMPS NOTED ON SMEAR, UNABLE TO ESTIMATE  --  34*  --  26*   < > = values in this interval not displayed.   Cardiac Enzymes: Recent Labs  Lab 11/19/2018 1424 10/28/2018 2229  CKTOTAL 421* 665*  CKMB 15.2*  --    CBG: Recent Labs  Lab 11/02/18 2005 11-23-18 0017 11/23/2018 0420 11/23/18 0746 11-23-18 1137  GLUCAP 220* 180* 185* 227* 214*    Iron Studies: No results for input(s): IRON, TIBC, TRANSFERRIN, FERRITIN in the last 72 hours. Studies/Results: Ct Head Wo Contrast  Result Date: 11/01/2018 CLINICAL DATA:  Initial evaluation for acute altered mental status. EXAM: CT HEAD WITHOUT CONTRAST TECHNIQUE: Contiguous axial images were obtained from the base of the skull through the vertex  without intravenous contrast. COMPARISON:  Prior CT from 10/30/2018. FINDINGS: Brain: Age-related cerebral atrophy with mild chronic microvascular ischemic disease again noted. Remote lacunar infarcts noted at the bilateral basal ganglia. There is question of an evolving 1 cm hypodensity involving the right subinsular white matter (series 3, image 15), increased in prominence from previous exam. Additionally, there is an additional approximate 1 cm hypodense the involving the posterior right centrum semi ovale, also increased in prominence (series 3, image 22). Findings could reflect evolving acute to subacute ischemic infarcts. No other definite areas of acute or evolving large vessel ischemia. No acute intracranial hemorrhage. Vascular: No hyperdense vessel. Scattered vascular calcifications noted within the carotid siphons. Skull: Scalp soft tissues and calvarium within normal limits. Sinuses/Orbits: Globes and orbital soft tissues within normal limits. Paranasal sinuses are largely clear. Endotracheal and enteric tubes partially visualized. Bilateral mastoid effusions with fluid seen layering within the nasopharynx, likely related intubation. Other: None. IMPRESSION: 1. Question approximate 1 cm involving hypodensities involving the right subinsular white matter and right centrum semi  ovale, increased in prominence from recent CT, and suspicious for evolving acute to subacute ischemic infarcts. Finding could be further assessed with dedicated brain MRI for further evaluation. 2. No other acute intracranial abnormality identified. 3. Underlying age-related cerebral atrophy with chronic microvascular ischemic disease. Electronically Signed   By: Jeannine Boga M.D.   On: 11/01/2018 15:50   Ct Chest Wo Contrast  Result Date: 11/01/2018 CLINICAL DATA:  Pleural effusion EXAM: CT CHEST WITHOUT CONTRAST TECHNIQUE: Multidetector CT imaging of the chest was performed following the standard protocol without IV  contrast. COMPARISON:  None. FINDINGS: Cardiovascular: The heart is enlarged. There is a small pericardial effusion. The intracardiac blood pool is hypodense relative to the adjacent myocardium consistent with anemia. Coronary artery calcifications are noted. The main pulmonary artery is dilated measuring approximately 3 cm in diameter. Mediastinum/Nodes: --there are prominent mediastinal and hilar lymph nodes. --there are prominent but subcentimeter axillary lymph nodes bilaterally. --No supraclavicular lymphadenopathy. --Normal thyroid gland. --there is an enteric tube that extends through the esophagus and terminates below the GE junction. The tip is not visualized. Lungs/Pleura: There is a large likely loculated complex a. Left-sided pleural effusion. There is compressive atelectasis involving much of the left lung field with scattered areas of consolidation. There is some atelectasis involving the right lung base. There is no pneumothorax. No significant right-sided pleural effusion. Evaluation is limited by motion artifact. The endotracheal tube terminates above the carina. Upper Abdomen: No acute abnormality. Musculoskeletal: No chest wall abnormality. No acute or significant osseous findings. Review of the MIP images confirms the above findings. IMPRESSION: 1. Large complex likely multiloculated left-sided pleural effusion with adjacent atelectasis/consolidation. 2. Small amount of atelectasis at the right lung base, otherwise the right lung field is essentially clear. 3. Cardiomegaly. 4. Mildly dilated main pulmonary artery which can be seen in patients with elevated PA pressures. 5. Mediastinal and hilar adenopathy, presumably reactive in etiology. 6. Anemia Aortic Atherosclerosis (ICD10-I70.0). Electronically Signed   By: Constance Holster M.D.   On: 11/01/2018 18:04   Mr Brain Wo Contrast  Result Date: 11/02/2018 CLINICAL DATA:  Altered mental status with abnormal CT EXAM: MRI HEAD WITHOUT CONTRAST  TECHNIQUE: Multiplanar, multiecho pulse sequences of the brain and surrounding structures were obtained without intravenous contrast. COMPARISON:  CT from yesterday FINDINGS: Brain: Multiple 1 cm or smaller infarcts in the bilateral cerebral white matter extending to the subcortical brain and occasionally to the cortex of the cerebral convexities. No infratentorial or deep gray nucleus infarct. These infarcts are seen in both the anterior and posterior circulation. No acute hemorrhage, hydrocephalus, collection, or masslike finding Vascular: Major flow voids are preserved Skull and upper cervical spine: Negative for marrow lesion Sinuses/Orbits: Partial bilateral mastoid opacification. Essentially negative nasopharynx. Other: Asymmetric expansion of the right parotid gland withadjacent fat stranding. IMPRESSION: 1. 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 2. Right parotiditis which may explain the new white count. Electronically Signed   By: Monte Fantasia M.D.   On: 11/02/2018 06:39   Dg Chest Port 1 View  Result Date: 11/02/2018 CLINICAL DATA:  [place]. EXAM: PORTABLE CHEST 1 VIEW COMPARISON:  One-view chest x-ray 11/01/2018 FINDINGS: The heart size is normal. Endotracheal tube terminates nearly at the carina, directed towards the right mainstem bronchus. Right IJ catheter is stable. Side port of the NG tube is in stomach. A left-sided chest tube is in place. Previously seen left fusion has been evacuated. Much of the left-sided airspace disease is cleared. There is no  significant pneumothorax. The right lung remains clear. IMPRESSION: 1. Interval evacuation of left pleural effusion and re-expansion of left lung following placement of chest tube. 2. Endotracheal tube terminates near the carina, directed at the right mainstem bronchus. It could be pulled back approximately 2 cm for more optimal positioning. These results were called by telephone at the time of interpretation on  11/02/2018 at 1:45 pm to the patient's nurse, Marylyn Ishihara, who verbally acknowledged these results. Electronically Signed   By: San Morelle M.D.   On: 11/02/2018 13:52   . sodium chloride   Intravenous Once  . amiodarone  200 mg Oral Daily  . B-complex with vitamin C  1 tablet Per Tube Daily  . chlorhexidine gluconate (MEDLINE KIT)  15 mL Mouth Rinse BID  . Chlorhexidine Gluconate Cloth  6 each Topical Q0600  . famotidine  20 mg Per Tube Daily  . Gerhardt's butt cream   Topical BID  . insulin aspart  0-15 Units Subcutaneous Q4H  . insulin detemir  15 Units Subcutaneous Daily  . mouth rinse  15 mL Mouth Rinse 10 times per day  . sodium chloride flush  3 mL Intravenous Q12H  . thiamine  100 mg Per Tube Daily  . vancomycin variable dose per unstable renal function (pharmacist dosing)   Does not apply See admin instructions    BMET    Component Value Date/Time   NA 138 2018-11-21 0420   K 4.9 Nov 21, 2018 0420   CL 99 2018/11/21 0420   CO2 22 2018/11/21 0420   GLUCOSE 210 (H) 11-21-18 0420   BUN 91 (H) 2018-11-21 0420   CREATININE 2.44 (H) 21-Nov-2018 0420   CALCIUM 7.5 (L) 11-21-2018 0420   CALCIUM 8.7 02/05/2018 0214   GFRNONAA 20 (L) 11-21-2018 0420   GFRAA 23 (L) 2018-11-21 0420   CBC    Component Value Date/Time   WBC 8.2 November 21, 2018 0420   RBC 2.59 (L) November 21, 2018 0420   HGB 7.1 (L) 21-Nov-2018 0420   HCT 20.7 (L) 11-21-2018 0420   PLT 26 (LL) 21-Nov-2018 0420   MCV 79.9 (L) 2018/11/21 0420   MCH 27.4 21-Nov-2018 0420   MCHC 34.3 11/21/2018 0420   RDW 17.7 (H) 11-21-2018 0420   LYMPHSABS 0.2 (L) 11/14/2018 0533   MONOABS 0.3 10/28/2018 0533   EOSABS 0.1 11/04/2018 0533   BASOSABS 0.0 11/23/2018 0533    Assessment/Plan: 1. AKI/CKD stage 3 presumably due to ischemic ATN in setting of hypotension, volume depletion and concomitant ACE inhibition.  1. Continue to hold lisinopril and metformin 2. She required transfer to [facility] for CVVHD which was started on 11/14/2018 and  stopped 11/01/18 3. Remains oliguric and unable to tolerate IHD 4. Awaiting family discussion regarding transition of care.  If they want to remain aggressive she will need to be restarted on CVVHD but agree with conservative measures and recommend transition to [place] 2. Hyperkalemia - improved with CVVHD  3. 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  Plan per PCCM and Neuro consult 4. MRSA/Klebsiella bacteremia/sepsis- possibly related to left sided pneumonia and loculated fluid.  ID following. 5. Thrombocytopenia- worsening but did have platelet clumps on smear 2 days ago.  Will stop heparin for now and follow. 6. Metabolic acidosis- due to #1 and continue to hold metformin. 1. Improved with CVVHD 7. Hypotension/sepsis- unclear source. currently off of pressors. 8. Hyponatremia- improved 9. AMS- pt minimally arousable. MRI consistent with embolic cva's 10. Anemia- follow and transfuse prn. 11.  Falls- s/p wrist fracture recently. 12. Disposition- poor overall prognosis and PCCM discussing with family possible transition to comfort care given her history of FTT and multi-organ failure. Donetta Potts, MD [employer] [phone]

## 2018-11-24 NOTE — Progress Notes (Signed)
Pt. bp 93/52 art line pressure 83/32 MAP 64. Dr. Marval Regal paged and made aware orders received and documented as ordered. Primary nurse aware of current bp

## 2018-11-24 NOTE — Progress Notes (Signed)
Pt. HR sustaining 160's-170's. 137ml NS bolus given Dr. Marval Regal made aware orders to terminate hemodialysis treatment. Primary nurse aware

## 2018-11-24 NOTE — Progress Notes (Signed)
CRITICAL VALUE ALERT  Critical Value:  PLT 26  Date & Time Notied:  11/16/2018 05:50  Provider Notified: Jeral Fruit, RN Warren Lacy; CCMD  Orders Received/Actions taken: None

## 2018-11-24 NOTE — Progress Notes (Signed)
Subjective:  Intubated sedated   Antibiotics:  Anti-infectives (From admission, onward)   Start     Dose/Rate Route Frequency Ordered Stop   11-15-18 0600  ceFAZolin (ANCEF) IVPB 1 g/50 mL premix  Status:  Discontinued     1 g 100 mL/hr over 30 Minutes Intravenous Every 24 hours 11/02/18 1211 11/02/18 1248   11/02/18 1300  cefTRIAXone (ROCEPHIN) 2 g in sodium chloride 0.9 % 100 mL IVPB     2 g 200 mL/hr over 30 Minutes Intravenous Every 12 hours 11/02/18 1248     11/02/18 1209  vancomycin variable dose per unstable renal function (pharmacist dosing)      Does not apply See admin instructions 11/02/18 1209     11/01/18 2200  ceFAZolin (ANCEF) IVPB 2g/100 mL premix  Status:  Discontinued     2 g 200 mL/hr over 30 Minutes Intravenous Every 8 hours 11/01/18 1125 11/02/18 1211   10/31/18 0200  vancomycin (VANCOCIN) 500 mg in sodium chloride 0.9 % 100 mL IVPB  Status:  Discontinued     500 mg 100 mL/hr over 60 Minutes Intravenous Every 24 hours 10/30/18 1301 11/02/18 1209   10/31/18 0000  cefTRIAXone (ROCEPHIN) 2 g in sodium chloride 0.9 % 100 mL IVPB  Status:  Discontinued     2 g 200 mL/hr over 30 Minutes Intravenous Every 24 hours 10/30/18 1340 11/01/18 1125   10/30/18 2200  vancomycin (VANCOCIN) 500 mg in sodium chloride 0.9 % 100 mL IVPB  Status:  Discontinued     500 mg 100 mL/hr over 60 Minutes Intravenous Every 24 hours 10/30/18 0309 10/30/18 1301   10/30/18 1200  ceFEPIme (MAXIPIME) 2 g in sodium chloride 0.9 % 100 mL IVPB  Status:  Discontinued     2 g 200 mL/hr over 30 Minutes Intravenous Every 12 hours 10/30/18 0309 10/30/18 1340   11/09/2018 2215  vancomycin (VANCOCIN) 1,250 mg in sodium chloride 0.9 % 250 mL IVPB     1,250 mg 166.7 mL/hr over 90 Minutes Intravenous  Once 10/25/2018 2204 10/30/18 0109   11/15/2018 2200  ceFEPIme (MAXIPIME) 1 g in sodium chloride 0.9 % 100 mL IVPB  Status:  Discontinued     1 g 200 mL/hr over 30 Minutes Intravenous Every 24 hours  11/19/2018 2158 10/30/18 0309      Medications: Scheduled Meds:  sodium chloride   Intravenous Once   amiodarone  200 mg Oral Daily   B-complex with vitamin C  1 tablet Per Tube Daily   chlorhexidine gluconate (MEDLINE KIT)  15 mL Mouth Rinse BID   Chlorhexidine Gluconate Cloth  6 each Topical Q0600   famotidine  20 mg Per Tube Daily   Gerhardt's butt cream   Topical BID   insulin aspart  0-15 Units Subcutaneous Q4H   insulin detemir  15 Units Subcutaneous Daily   mouth rinse  15 mL Mouth Rinse 10 times per day   mupirocin ointment  1 application Nasal BID   sodium chloride flush  3 mL Intravenous Q12H   thiamine  100 mg Per Tube Daily   vancomycin variable dose per unstable renal function (pharmacist dosing)   Does not apply See admin instructions   Continuous Infusions:  sodium chloride 10 mL/hr at 11-15-2018 0700   sodium chloride     sodium chloride     albumin human     cefTRIAXone (ROCEPHIN)  IV Stopped (11/02/18 2205)   feeding supplement (VITAL HIGH PROTEIN) 50  mL/hr at 11/02/18 1800   phenylephrine (NEO-SYNEPHRINE) Adult infusion 10 mcg/min (November 23, 2018 0902)   PRN Meds:.sodium chloride, sodium chloride, sodium chloride, acetaminophen (TYLENOL) oral liquid 160 mg/5 mL, acetaminophen **OR** acetaminophen, alteplase, bisacodyl, docusate, fentaNYL (SUBLIMAZE) injection, heparin, ipratropium-albuterol, lidocaine (PF), lidocaine-prilocaine, [DISCONTINUED] ondansetron **OR** ondansetron (ZOFRAN) IV, pentafluoroprop-tetrafluoroeth, polyethylene glycol, sodium chloride flush    Objective: Weight change: 0.3 kg  Intake/Output Summary (Last 24 hours) at 23-Nov-2018 0941 Last data filed at 2018/11/23 0700 Gross per 24 hour  Intake 1599.51 ml  Output 1004 ml  Net 595.51 ml   Blood pressure (!) 93/49, pulse (!) 154, temperature (!) 101.5 F (38.6 C), temperature source Oral, resp. rate 19, height '4\' 10"'  (1.473 m), weight 61 kg, SpO2 100 %. Temp:  [99 F (37.2  C)-103.1 F (39.5 C)] 101.5 F (38.6 C) (08/11 0900) Pulse Rate:  [82-173] 154 (08/11 0900) Resp:  [19-33] 19 (08/11 0900) BP: (85-146)/(40-55) 93/49 (08/11 0900) SpO2:  [93 %-100 %] 100 % (08/11 0900) Arterial Line BP: (87-153)/(31-47) 96/41 (08/11 0900) FiO2 (%):  [40 %-60 %] 40 % (08/11 0751) Weight:  [60.7 kg-61 kg] 61 kg (08/11 0900)  Physical Exam: General: Intubated sedated on the ventilator HEENT: anicteric sclera, CVS tachycardic no murmurs gallops rubs heard  chest: , Coarse breath sounds purulent material in chest tube abdomen: soft non-distended,  Extremities: no edema or deformity noted bilaterally Arterial line and central dialysis catheter in place Neuro: nonfocal  CBC:    BMET Recent Labs    11/02/18 1549 11-23-2018 0420  NA 137 138  K 4.5 4.9  CL 98 99  CO2 23 22  GLUCOSE 301* 210*  BUN 70* 91*  CREATININE 2.14* 2.44*  CALCIUM 7.3* 7.5*     Liver Panel  Recent Labs    11/02/18 1549 11-23-18 0420  ALBUMIN <1.0* <1.0*       Sedimentation Rate No results for input(s): ESRSEDRATE in the last 72 hours. C-Reactive Protein No results for input(s): CRP in the last 72 hours.  Micro Results: Recent Results (from the past 720 hour(s))  SARS Coronavirus 2 [facility] order, Performed in [facility] hospital lab) Nasopharyngeal Nasopharyngeal Swab     Status: None   Collection Time: 11/13/2018  5:55 AM   Specimen: Nasopharyngeal Swab  Result Value Ref Range Status   SARS Coronavirus 2 NEGATIVE NEGATIVE Final    Comment: (NOTE) If result is NEGATIVE SARS-CoV-2 target nucleic acids are NOT DETECTED. The SARS-CoV-2 RNA is generally detectable in upper and lower  respiratory specimens during the acute phase of infection. The lowest  concentration of SARS-CoV-2 viral copies this assay can detect is 250  copies / mL. A negative result does not preclude SARS-CoV-2 infection  and should not be used as the sole basis for treatment or other  patient  management decisions.  A negative result may occur with  improper specimen collection / handling, submission of specimen other  than nasopharyngeal swab, presence of viral mutation(s) within the  areas targeted by this assay, and inadequate number of viral copies  (<250 copies / mL). A negative result must be combined with clinical  observations, patient history, and epidemiological information. If result is POSITIVE SARS-CoV-2 target nucleic acids are DETECTED. The SARS-CoV-2 RNA is generally detectable in upper and lower  respiratory specimens dur ing the acute phase of infection.  Positive  results are indicative of active infection with SARS-CoV-2.  Clinical  correlation with patient history and other diagnostic information is  necessary to determine patient infection  status.  Positive results do  not rule out bacterial infection or co-infection with other viruses. If result is PRESUMPTIVE POSTIVE SARS-CoV-2 nucleic acids MAY BE PRESENT.   A presumptive positive result was obtained on the submitted specimen  and confirmed on repeat testing.  While 2019 novel coronavirus  (SARS-CoV-2) nucleic acids may be present in the submitted sample  additional confirmatory testing may be necessary for epidemiological  and / or clinical management purposes  to differentiate between  SARS-CoV-2 and other Sarbecovirus currently known to infect humans.  If clinically indicated additional testing with an alternate test  methodology [phone]) is advised. The SARS-CoV-2 RNA is generally  detectable in upper and lower respiratory sp ecimens during the acute  phase of infection. The expected result is Negative. Fact Sheet for Patients:  StrictlyIdeas.no Fact Sheet for Healthcare Providers: BankingDealers.co.za This test is not yet approved or cleared by the [place] FDA and has been authorized for detection and/or diagnosis of SARS-CoV-2 by FDA under  an Emergency Use Authorization (EUA).  This EUA will remain in effect (meaning this test can be used) for the duration of the COVID-19 declaration under Section 564(b)(1) of the Act, 21 U.S.C. section 360bbb-3(b)(1), unless the authorization is terminated or revoked sooner. Performed at [facility], [address], [place], [place] [address]   MRSA PCR Screening     Status: Abnormal   Collection Time: 11/08/2018 12:54 PM   Specimen: Nasopharyngeal  Result Value Ref Range Status   MRSA by PCR POSITIVE (A) NEGATIVE Final    Comment:        The GeneXpert MRSA Assay (FDA approved for NASAL specimens only), is one component of a comprehensive MRSA colonization surveillance program. It is not intended to diagnose MRSA infection nor to guide or monitor treatment for MRSA infections. RESULT CALLED TO, READ BACK BY AND VERIFIED WITH: PROCTER,R ON 11/13/2018 AT 2305 BY LOY,C Performed at [facility], [address], [place], [place] [address]   Culture, blood (routine x 2)     Status: Abnormal   Collection Time: 10/31/2018 10:50 PM   Specimen: BLOOD  Result Value Ref Range Status   Specimen Description BLOOD RIGHT IJ  Final   Special Requests   Final    BOTTLES DRAWN AEROBIC AND ANAEROBIC Blood Culture adequate volume   Culture  Setup Time   Final    GRAM POSITIVE COCCI IN CLUSTERS IN BOTH AEROBIC AND ANAEROBIC BOTTLES GRAM NEGATIVE RODS AEROBIC BOTTLE ONLY CRITICAL RESULT CALLED TO, READ BACK BY AND VERIFIED WITH: A LIPPUCCI,PHARMD AT 1232 10/30/2018 BY L BENFIELD    Culture (A)  Final    KLEBSIELLA PNEUMONIAE STAPHYLOCOCCUS AUREUS FOR ORGANISM 2 SUSCEPTIBILITIES PERFORMED ON PREVIOUS CULTURE WITHIN THE LAST 5 DAYS. Performed at [facility], [place] [address] [place], [place] [address]    Report Status 11/01/2018 FINAL  Final   Organism ID, Bacteria KLEBSIELLA PNEUMONIAE  Final      Susceptibility   Klebsiella pneumoniae - MIC*    AMPICILLIN RESISTANT Resistant     CEFAZOLIN <=4  SENSITIVE Sensitive     CEFEPIME <=1 SENSITIVE Sensitive     CEFTAZIDIME <=1 SENSITIVE Sensitive     CEFTRIAXONE <=1 SENSITIVE Sensitive     CIPROFLOXACIN <=0.25 SENSITIVE Sensitive     GENTAMICIN <=1 SENSITIVE Sensitive     IMIPENEM <=0.25 SENSITIVE Sensitive     TRIMETH/SULFA <=20 SENSITIVE Sensitive     AMPICILLIN/SULBACTAM 4 SENSITIVE Sensitive     PIP/TAZO <=4 SENSITIVE Sensitive  Extended ESBL NEGATIVE Sensitive     * KLEBSIELLA PNEUMONIAE  Blood Culture ID Panel (Reflexed)     Status: Abnormal   Collection Time: 11/21/2018 10:50 PM  Result Value Ref Range Status   Enterococcus species NOT DETECTED NOT DETECTED Final   Listeria monocytogenes NOT DETECTED NOT DETECTED Final   Staphylococcus species DETECTED (A) NOT DETECTED Final    Comment: CRITICAL RESULT CALLED TO, READ BACK BY AND VERIFIED WITH: A LIPPUCCI,PHARMD AT 1232 10/30/2018 BY L BENFIELD    Staphylococcus aureus (BCID) DETECTED (A) NOT DETECTED Final    Comment: Methicillin (oxacillin)-resistant Staphylococcus aureus (MRSA). MRSA is predictably resistant to beta-lactam antibiotics (except ceftaroline). Preferred therapy is vancomycin unless clinically contraindicated. Patient requires contact precautions if  hospitalized. CRITICAL RESULT CALLED TO, READ BACK BY AND VERIFIED WITH: A LIPPUCCI,PHARMD AT 1232 10/30/2018 BY L BENFIELD    Methicillin resistance DETECTED (A) NOT DETECTED Final    Comment: CRITICAL RESULT CALLED TO, READ BACK BY AND VERIFIED WITH: A LIPPUCCI,PHARMD AT 1232 10/30/2018 BY L BENFIELD    Streptococcus species NOT DETECTED NOT DETECTED Final   Streptococcus agalactiae NOT DETECTED NOT DETECTED Final   Streptococcus pneumoniae NOT DETECTED NOT DETECTED Final   Streptococcus pyogenes NOT DETECTED NOT DETECTED Final   Acinetobacter baumannii NOT DETECTED NOT DETECTED Final   Enterobacteriaceae species DETECTED (A) NOT DETECTED Final    Comment: Enterobacteriaceae represent a large family of  gram-negative bacteria, not a single organism. CRITICAL RESULT CALLED TO, READ BACK BY AND VERIFIED WITH: A LIPPUCCI,PHARMD AT 1232 10/30/2018 BY L BENFIELD    Enterobacter cloacae complex NOT DETECTED NOT DETECTED Final   Escherichia coli NOT DETECTED NOT DETECTED Final   Klebsiella oxytoca NOT DETECTED NOT DETECTED Final   Klebsiella pneumoniae DETECTED (A) NOT DETECTED Final    Comment: CRITICAL RESULT CALLED TO, READ BACK BY AND VERIFIED WITH: A LIPPUCCI,PHARMD AT 1232 10/30/2018 BY L BENFIELD    Proteus species NOT DETECTED NOT DETECTED Final   Serratia marcescens NOT DETECTED NOT DETECTED Final   Carbapenem resistance NOT DETECTED NOT DETECTED Final   Haemophilus influenzae NOT DETECTED NOT DETECTED Final   Neisseria meningitidis NOT DETECTED NOT DETECTED Final   Pseudomonas aeruginosa NOT DETECTED NOT DETECTED Final   Candida albicans NOT DETECTED NOT DETECTED Final   Candida glabrata NOT DETECTED NOT DETECTED Final   Candida krusei NOT DETECTED NOT DETECTED Final   Candida parapsilosis NOT DETECTED NOT DETECTED Final   Candida tropicalis NOT DETECTED NOT DETECTED Final    Comment: Performed at [facility], [place]. [address] [place], [place] [address]  Culture, blood (routine x 2)     Status: Abnormal   Collection Time: 11/07/2018 11:15 PM   Specimen: BLOOD  Result Value Ref Range Status   Specimen Description BLOOD RIGHT FEM LINE  Final   Special Requests   Final    BOTTLES DRAWN AEROBIC AND ANAEROBIC Blood Culture adequate volume   Culture  Setup Time   Final    GRAM POSITIVE COCCI IN BOTH AEROBIC AND ANAEROBIC BOTTLES CRITICAL VALUE NOTED.  VALUE IS CONSISTENT WITH PREVIOUSLY REPORTED AND CALLED VALUE. Performed at [facility], [place] [address], [place], [place] [address]    Culture METHICILLIN RESISTANT STAPHYLOCOCCUS AUREUS (A)  Final   Report Status 11/01/2018 FINAL  Final   Organism ID, Bacteria METHICILLIN RESISTANT STAPHYLOCOCCUS AUREUS  Final       Susceptibility   Methicillin resistant staphylococcus aureus - MIC*    CIPROFLOXACIN <=0.5  SENSITIVE Sensitive     ERYTHROMYCIN <=0.25 SENSITIVE Sensitive     GENTAMICIN <=0.5 SENSITIVE Sensitive     OXACILLIN >=4 RESISTANT Resistant     TETRACYCLINE <=1 SENSITIVE Sensitive     VANCOMYCIN 1 SENSITIVE Sensitive     TRIMETH/SULFA <=10 SENSITIVE Sensitive     CLINDAMYCIN <=0.25 SENSITIVE Sensitive     RIFAMPIN <=0.5 SENSITIVE Sensitive     Inducible Clindamycin NEGATIVE Sensitive     * METHICILLIN RESISTANT STAPHYLOCOCCUS AUREUS  CSF culture     Status: None   Collection Time: 10/30/18  4:28 PM   Specimen: CSF; Cerebrospinal Fluid  Result Value Ref Range Status   Specimen Description CSF  Final   Special Requests NONE  Final   Gram Stain   Final    WBC PRESENT, PREDOMINANTLY PMN NO ORGANISMS SEEN CYTOSPIN SMEAR    Culture   Final    NO GROWTH 3 DAYS Performed at [facility], [place] [address] [place], [place] [address]    Report Status 11-25-18 FINAL  Final  Culture, blood (Routine X 2) w Reflex to ID Panel     Status: None (Preliminary result)   Collection Time: 10/30/18  6:20 PM   Specimen: BLOOD RIGHT HAND  Result Value Ref Range Status   Specimen Description BLOOD RIGHT HAND  Final   Special Requests   Final    BOTTLES DRAWN AEROBIC ONLY Blood Culture adequate volume   Culture   Final    NO GROWTH 3 DAYS Performed at [facility], [place] [address], [place], [place] [address]    Report Status PENDING  Incomplete  Culture, blood (Routine X 2) w Reflex to ID Panel     Status: Abnormal   Collection Time: 10/30/18  6:25 PM   Specimen: BLOOD  Result Value Ref Range Status   Specimen Description BLOOD RIGHT WRIST  Final   Special Requests   Final    BOTTLES DRAWN AEROBIC ONLY Blood Culture results may not be optimal due to an inadequate volume of blood received in culture bottles   Culture  Setup Time   Final    GRAM POSITIVE COCCI IN CLUSTERS AEROBIC BOTTLE  ONLY CRITICAL VALUE NOTED.  VALUE IS CONSISTENT WITH PREVIOUSLY REPORTED AND CALLED VALUE.    Culture (A)  Final    STAPHYLOCOCCUS AUREUS SUSCEPTIBILITIES PERFORMED ON PREVIOUS CULTURE WITHIN THE LAST 5 DAYS. Performed at [facility], [place] [address], [place], [place] [address]    Report Status Nov 25, 2018 FINAL  Final  Body fluid culture (includes gram stain)     Status: None (Preliminary result)   Collection Time: 11/02/18 10:38 AM   Specimen: Pleural Fluid  Result Value Ref Range Status   Specimen Description PLEURAL LEFT  Final   Special Requests NONE  Final   Gram Stain   Final    ABUNDANT WBC PRESENT, PREDOMINANTLY MONONUCLEAR ABUNDANT GRAM POSITIVE COCCI    Culture   Final    CULTURE REINCUBATED FOR BETTER GROWTH Performed at [facility], [place] [address] [place], [place] [address]    Report Status PENDING  Incomplete    Studies/Results: Ct Head Wo Contrast  Result Date: 11/01/2018 CLINICAL DATA:  Initial evaluation for acute altered mental status. EXAM: CT HEAD WITHOUT CONTRAST TECHNIQUE: Contiguous axial images were obtained from the base of the skull through the vertex without intravenous contrast. COMPARISON:  Prior CT from 10/30/2018. FINDINGS: Brain: Age-related cerebral atrophy with mild chronic microvascular ischemic disease  again noted. Remote lacunar infarcts noted at the bilateral basal ganglia. There is question of an evolving 1 cm hypodensity involving the right subinsular white matter (series 3, image 15), increased in prominence from previous exam. Additionally, there is an additional approximate 1 cm hypodense the involving the posterior right centrum semi ovale, also increased in prominence (series 3, image 22). Findings could reflect evolving acute to subacute ischemic infarcts. No other definite areas of acute or evolving large vessel ischemia. No acute intracranial hemorrhage. Vascular: No hyperdense vessel. Scattered vascular calcifications noted  within the carotid siphons. Skull: Scalp soft tissues and calvarium within normal limits. Sinuses/Orbits: Globes and orbital soft tissues within normal limits. Paranasal sinuses are largely clear. Endotracheal and enteric tubes partially visualized. Bilateral mastoid effusions with fluid seen layering within the nasopharynx, likely related intubation. Other: None. IMPRESSION: 1. Question approximate 1 cm involving hypodensities involving the right subinsular white matter and right centrum semi ovale, increased in prominence from recent CT, and suspicious for evolving acute to subacute ischemic infarcts. Finding could be further assessed with dedicated brain MRI for further evaluation. 2. No other acute intracranial abnormality identified. 3. Underlying age-related cerebral atrophy with chronic microvascular ischemic disease. Electronically Signed   By: Jeannine Boga M.D.   On: 11/01/2018 15:50   Ct Chest Wo Contrast  Result Date: 11/01/2018 CLINICAL DATA:  Pleural effusion EXAM: CT CHEST WITHOUT CONTRAST TECHNIQUE: Multidetector CT imaging of the chest was performed following the standard protocol without IV contrast. COMPARISON:  None. FINDINGS: Cardiovascular: The heart is enlarged. There is a small pericardial effusion. The intracardiac blood pool is hypodense relative to the adjacent myocardium consistent with anemia. Coronary artery calcifications are noted. The main pulmonary artery is dilated measuring approximately 3 cm in diameter. Mediastinum/Nodes: --there are prominent mediastinal and hilar lymph nodes. --there are prominent but subcentimeter axillary lymph nodes bilaterally. --No supraclavicular lymphadenopathy. --Normal thyroid gland. --there is an enteric tube that extends through the esophagus and terminates below the GE junction. The tip is not visualized. Lungs/Pleura: There is a large likely loculated complex a. Left-sided pleural effusion. There is compressive atelectasis involving much  of the left lung field with scattered areas of consolidation. There is some atelectasis involving the right lung base. There is no pneumothorax. No significant right-sided pleural effusion. Evaluation is limited by motion artifact. The endotracheal tube terminates above the carina. Upper Abdomen: No acute abnormality. Musculoskeletal: No chest wall abnormality. No acute or significant osseous findings. Review of the MIP images confirms the above findings. IMPRESSION: 1. Large complex likely multiloculated left-sided pleural effusion with adjacent atelectasis/consolidation. 2. Small amount of atelectasis at the right lung base, otherwise the right lung field is essentially clear. 3. Cardiomegaly. 4. Mildly dilated main pulmonary artery which can be seen in patients with elevated PA pressures. 5. Mediastinal and hilar adenopathy, presumably reactive in etiology. 6. Anemia Aortic Atherosclerosis (ICD10-I70.0). Electronically Signed   By: Constance Holster M.D.   On: 11/01/2018 18:04   Mr Brain Wo Contrast  Result Date: 11/02/2018 CLINICAL DATA:  Altered mental status with abnormal CT EXAM: MRI HEAD WITHOUT CONTRAST TECHNIQUE: Multiplanar, multiecho pulse sequences of the brain and surrounding structures were obtained without intravenous contrast. COMPARISON:  CT from yesterday FINDINGS: Brain: Multiple 1 cm or smaller infarcts in the bilateral cerebral white matter extending to the subcortical brain and occasionally to the cortex of the cerebral convexities. No infratentorial or deep gray nucleus infarct. These infarcts are seen in both the anterior and posterior circulation. No acute  hemorrhage, hydrocephalus, collection, or masslike finding Vascular: Major flow voids are preserved Skull and upper cervical spine: Negative for marrow lesion Sinuses/Orbits: Partial bilateral mastoid opacification. Essentially negative nasopharynx. Other: Asymmetric expansion of the right parotid gland withadjacent fat stranding.  IMPRESSION: 1. 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 2. Right parotiditis which may explain the new white count. Electronically Signed   By: Monte Fantasia M.D.   On: 11/02/2018 06:39   Dg Chest Port 1 View  Result Date: 11/02/2018 CLINICAL DATA:  Empyema. EXAM: PORTABLE CHEST 1 VIEW COMPARISON:  One-view chest x-ray 11/01/2018 FINDINGS: The heart size is normal. Endotracheal tube terminates nearly at the carina, directed towards the right mainstem bronchus. Right IJ catheter is stable. Side port of the NG tube is in stomach. A left-sided chest tube is in place. Previously seen left fusion has been evacuated. Much of the left-sided airspace disease is cleared. There is no significant pneumothorax. The right lung remains clear. IMPRESSION: 1. Interval evacuation of left pleural effusion and re-expansion of left lung following placement of chest tube. 2. Endotracheal tube terminates near the carina, directed at the right mainstem bronchus. It could be pulled back approximately 2 cm for more optimal positioning. These results were called by telephone at the time of interpretation on 11/02/2018 at 1:45 pm to the patient's nurse, Marylyn Ishihara, who verbally acknowledged these results. Electronically Signed   By: San Morelle M.D.   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 female with MRSA and Klebsiella pneumonia bacteremia,  septic shock multiorgan failure who had been on CVVHD and multiple pressors when I saw her last week now has improved hemodynamically over the weekend off CVVHD.    She underwent lumbar puncture last week which did not show evidence of meningitis by LP.   She did have an MRI concerning for evidence of parotitis and also for emboli in the CNS.  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  She still has a central line in her neck as well as arterial line which are undoubtedly seeded by her polymicrobial bacteremia.   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  Sources and sites of infection include parotid glands and empyema  Today my understanding is that family have decided to withdraw aggressive support and patient will come to the end of her life.   LOS: 5 days   Alcide Evener 11/11/2018, 9:41 AM

## 2018-11-24 NOTE — Progress Notes (Signed)
Nutrition Brief Note  Chart reviewed. Pt now transitioning to comfort care.  No further nutrition interventions warranted at this time.  Please re-consult as needed.    Brendy Ficek MS, RDN, LDN, CNSC [phone] Pager  [phone] Weekend/On-Call Pager    

## 2018-11-24 NOTE — Progress Notes (Signed)
Patient declared deceased by two RN's. Verified by Pieter Partridge, RN and Riki Altes, RN. Notified CCMD, CDS, and patient's brother. Wasted 35 ML Morphine with Riki Altes, RN.  Milford Cage, RN

## 2018-11-24 NOTE — Progress Notes (Addendum)
Attempted to telephone contact listed on pt. Chart Kristen Jones to obtain consent for hemodialysis no answer. Pt. Primary nurse provided number for pt. Brother Kristen Jones [phone] who has given consent for previous procedures. This nurse telephone Kristen Jones and obtained consent for hemodialysis with 2 nurse verification.

## 2018-11-24 NOTE — Progress Notes (Signed)
Patient extubated to comfort care measures. RN at bedside. Ventilator pulled from the room.

## 2018-11-24 NOTE — Progress Notes (Signed)
Patient fevered to 103.1 orally this AM, ice packs applied, cannot receive tylenol yet, ordered cooling blanket. HD treatment started and patient quickly became hypotensive, Neo restarted and albumin given. Patient HR went up to 170's and sustained at about 150's. Per nephrology doctor, stopping treatment. CCM doctor Georgann Housekeeper and Ina Homes present and aware. MD will attempt to contact brother/family. Will continue to follow up.   Dewaine Oats, RN

## 2018-11-24 NOTE — Progress Notes (Signed)
Spoke with MD Georgann Housekeeper this AM following blood transfusion orders. Family expected to visit today to decide where to go from here, since Hemodialysis had to be stopped. Holding off on blood transfusion to see what family decides this afternoon. Will continue to follow up.  Dewaine Oats, RN

## 2018-11-24 NOTE — Significant Event (Signed)
PCCM INTERVAL PROGRESS NOTE   Long discussion with brother Wille Glaser (in person) and sister Katharine Look (via phone) regarding goals of care for Carrick. They understand that her condition remains critical and based on recent findings of MRI brain (CVA vs septic embolization) and the inability to tolerate HD, it is unlikely for her to survive with a meaningful outcome. At this time they elect to transition to comfort care with plans for compassionate extubation today.   Georgann Housekeeper, AGACNP-BC [place] Pager [phone] or [phone]  11-07-2018 1:34 PM
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
## 2018-11-24 NOTE — Discharge Summary (Signed)
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  She remained obtunded and MRI revealed multiple strokes.  EEG without seizures, LP benign.  She was kept on maximal care for 4 days without improvement in mental status or shock state so family discussion held with sister and brother who decided to allow her to pass away in peace.  Erskine Emery MD PCCM

## 2018-11-24 DEATH — deceased

## 2019-08-25 IMAGING — CT CT CHEST WITHOUT CONTRAST
2 of 4 series · 15 of 36 positions shown, 18 images · non-contrast
Comparison: None.

CLINICAL DATA: Pleural effusion

EXAM:
CT CHEST WITHOUT CONTRAST
TECHNIQUE: Multidetector CT imaging of the chest was performed following the
standard protocol without IV contrast.

[Series 3: chest wo · axial · 0.58mm/px · z∈[-544,-316]mm · 12 of 136 slices shown, 15 images]
[im 11/136  mediastinal]
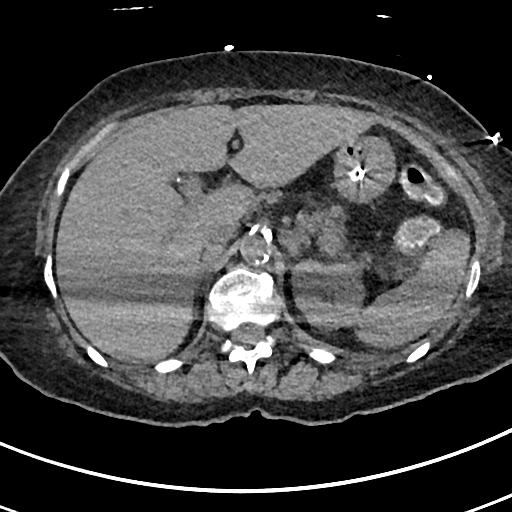
[im 11/136  lung]
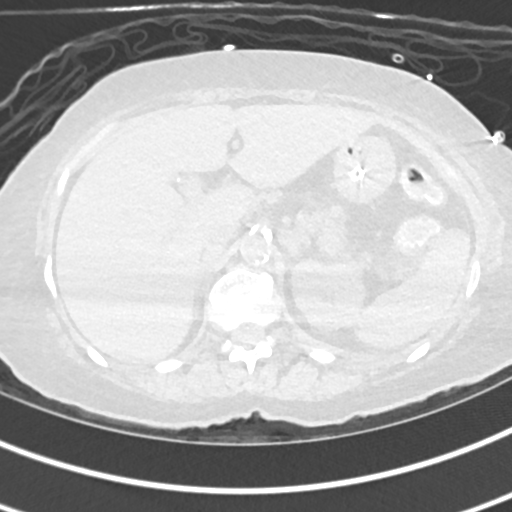
[im 21/136  lung]
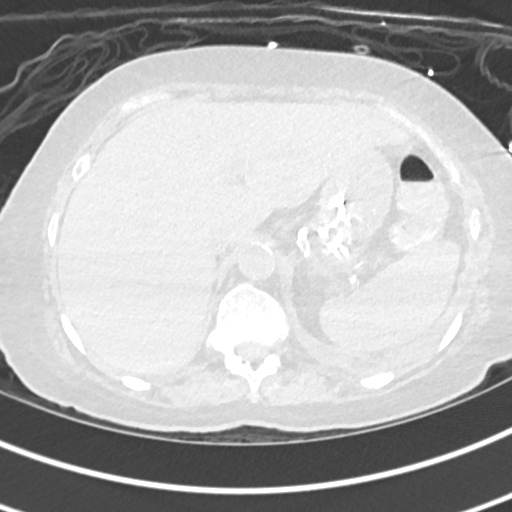
[im 32/136  lung]
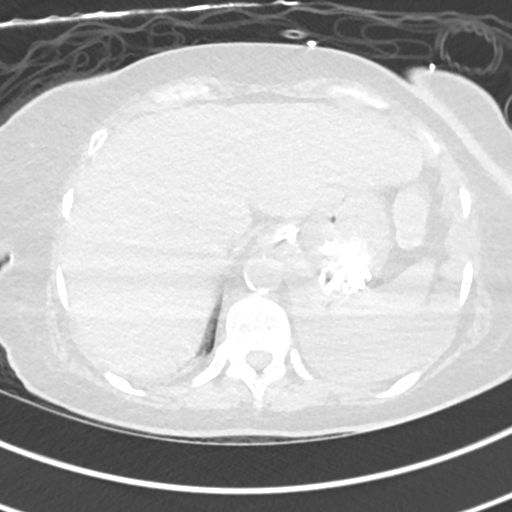
[im 42/136  lung]
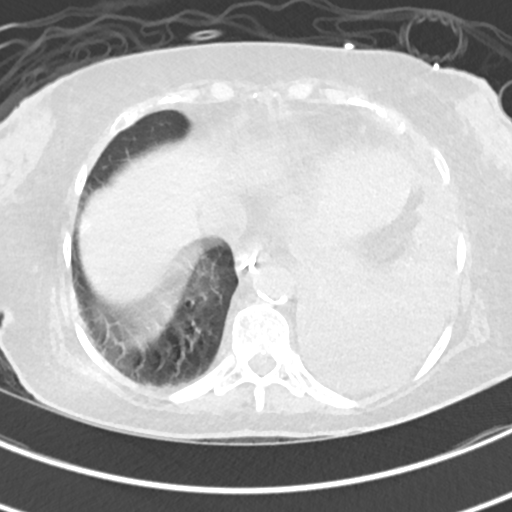
[im 52/136  mediastinal]
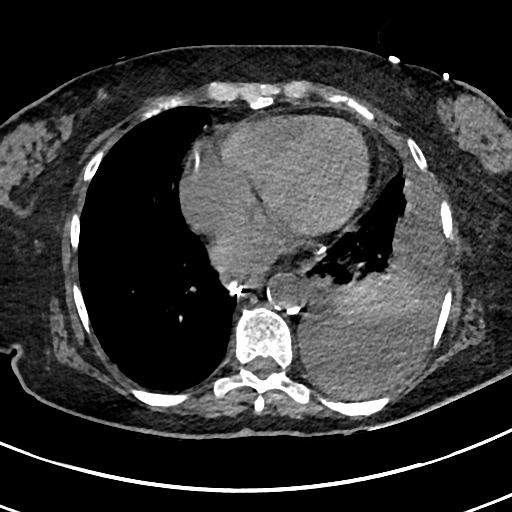
[im 52/136  lung]
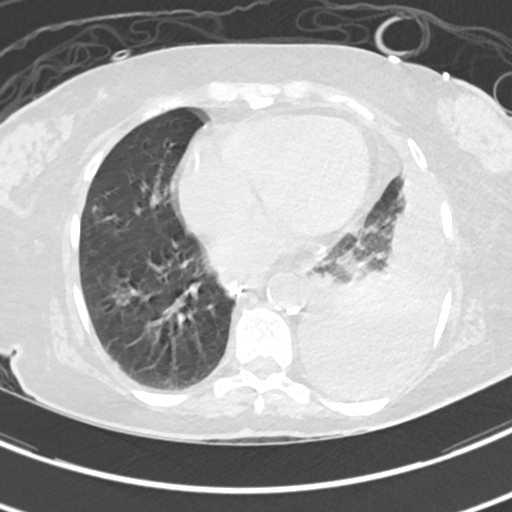
[im 63/136  lung]
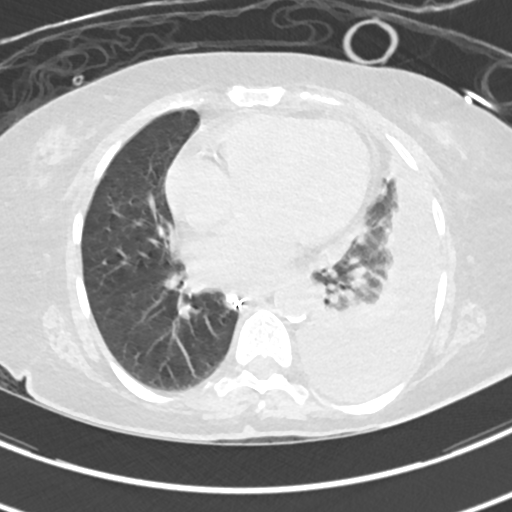
[im 73/136  lung]
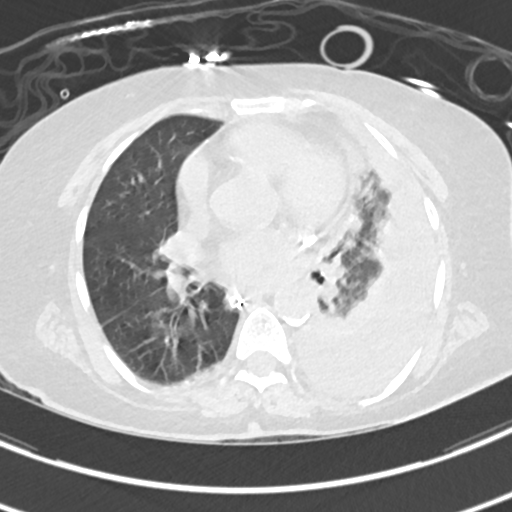
[im 84/136  lung]
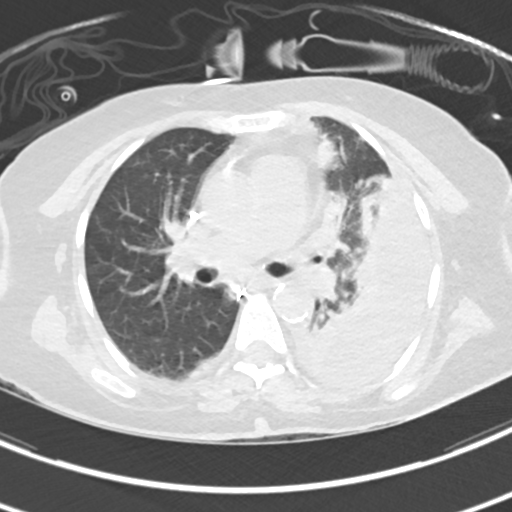
[im 94/136  mediastinal]
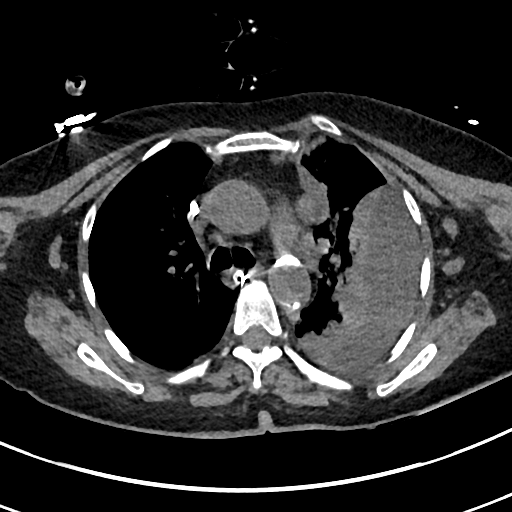
[im 94/136  lung]
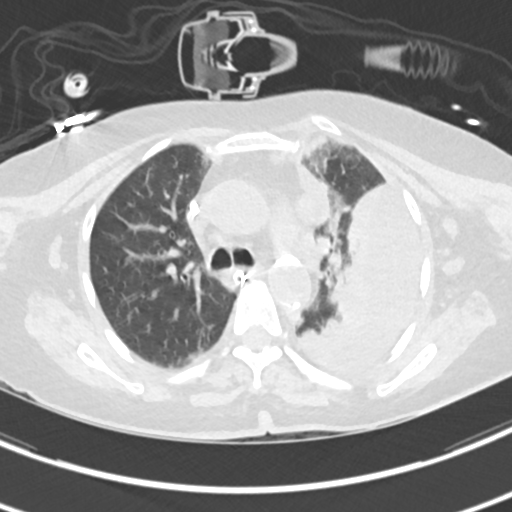
[im 104/136  lung]
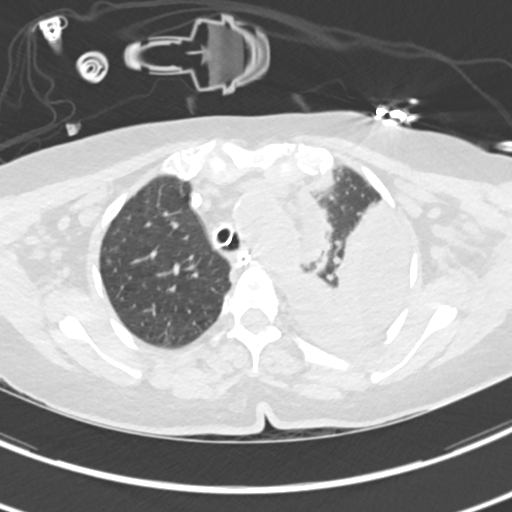
[im 115/136  lung]
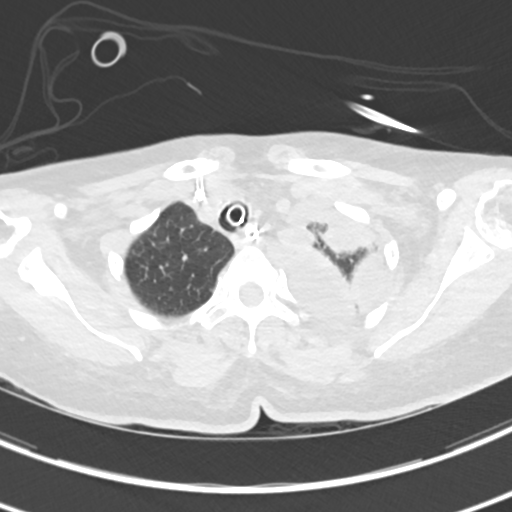
[im 125/136  lung]
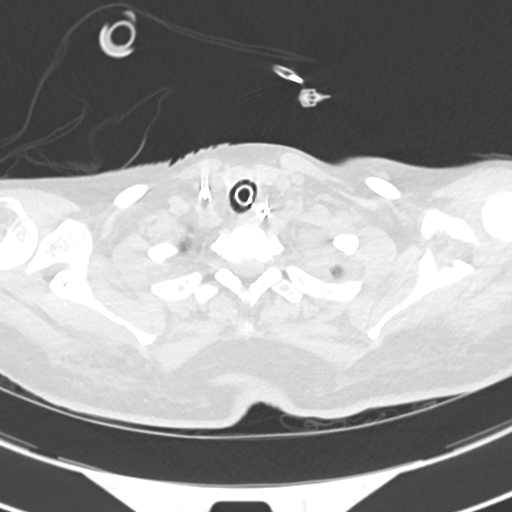

[Series 6: cor · coronal · 0.58mm/px · 3 of 105 slices shown]
[im 21/105  lung]
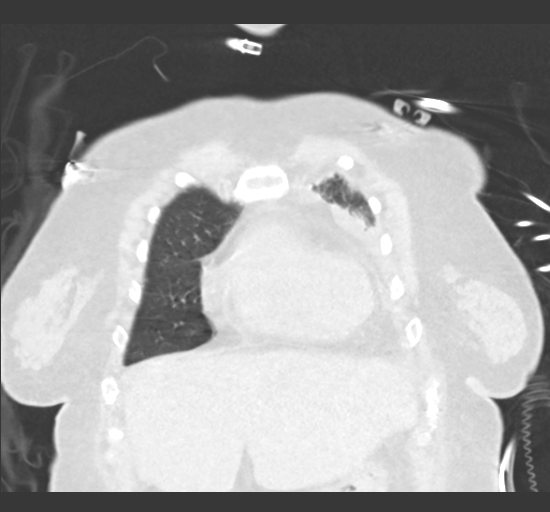
[im 42/105  lung]
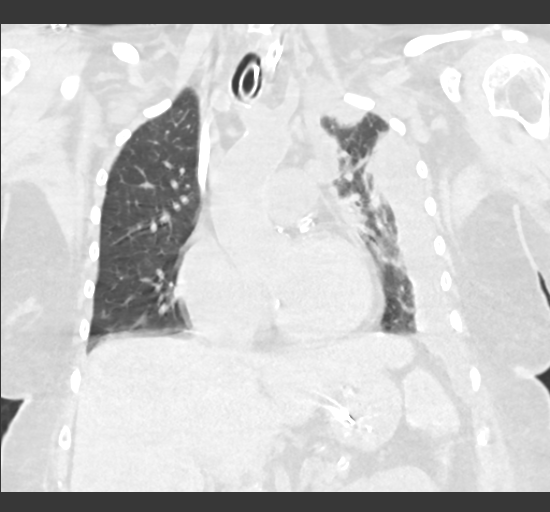
[im 63/105  lung]
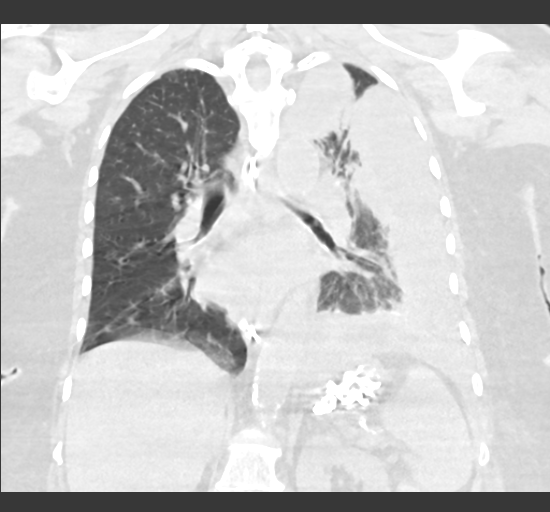

[15 of 36 positions shown; findings below may reference images not displayed]

FINDINGS: Cardiovascular: The heart is enlarged. There is a small pericardial
effusion. The intracardiac blood pool is hypodense relative to the
adjacent myocardium consistent with anemia. Coronary artery
calcifications are noted. The main pulmonary artery is dilated
measuring approximately 3 cm in diameter.

Mediastinum/Nodes:

--there are prominent mediastinal and hilar lymph nodes.

--there are prominent but subcentimeter axillary lymph nodes
bilaterally.

--No supraclavicular lymphadenopathy.

--Normal thyroid gland.

--there is an enteric tube that extends through the esophagus and
terminates below the GE junction. The tip is not visualized.

Lungs/Pleura: There is a large likely loculated complex a.
Left-sided pleural effusion. There is compressive atelectasis
involving much of the left lung field with scattered areas of
consolidation. There is some atelectasis involving the right lung
base. There is no pneumothorax. No significant right-sided pleural
effusion. Evaluation is limited by motion artifact. The endotracheal
tube terminates above the carina.

Upper Abdomen: No acute abnormality.

Musculoskeletal: No chest wall abnormality. No acute or significant
osseous findings.

Review of the MIP images confirms the above findings.
IMPRESSION: 1. Large complex likely multiloculated left-sided pleural effusion
with adjacent atelectasis/consolidation.
2. Small amount of atelectasis at the right lung base, otherwise the
right lung field is essentially clear.
3. Cardiomegaly.
4. Mildly dilated main pulmonary artery which can be seen in
patients with elevated PA pressures.
5. Mediastinal and hilar adenopathy, presumably reactive in
etiology.
6. Anemia

Aortic Atherosclerosis ([id number]-2F0.0).

## 2019-08-25 IMAGING — CT CT HEAD WITHOUT CONTRAST
4 series · 16 of 47 positions shown, 18 images · non-contrast
Comparison: Prior CT from 10/30/2018.

CLINICAL DATA: Initial evaluation for acute altered mental status.

EXAM:
CT HEAD WITHOUT CONTRAST
TECHNIQUE: Contiguous axial images were obtained from the base of the skull
through the vertex without intravenous contrast.

[Series 3: head wo · axial · 0.38mm/px · z∈[-208,-92]mm · 7 of 31 slices shown, 9 images]
[im 4/31  brain]
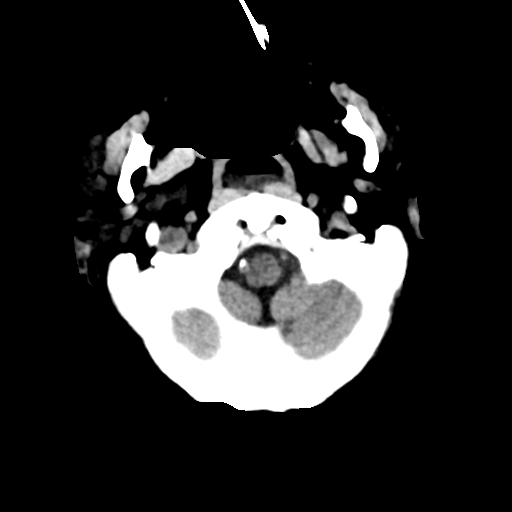
[im 4/31  bone]
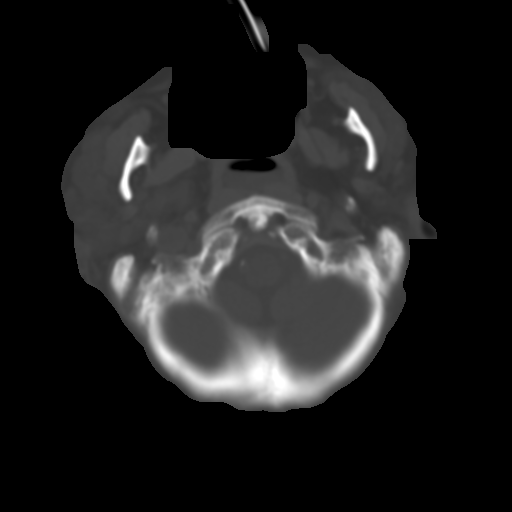
[im 8/31  brain]
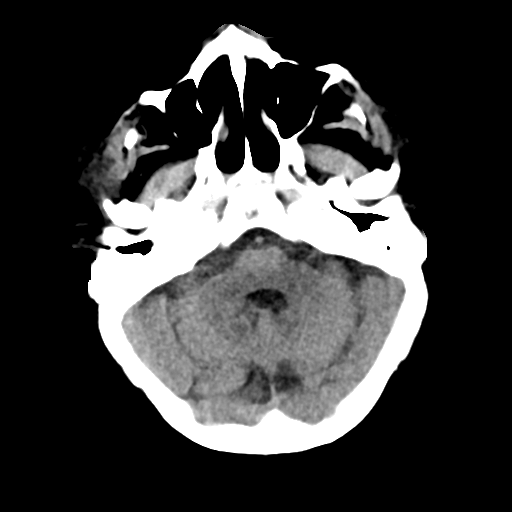
[im 12/31  brain]
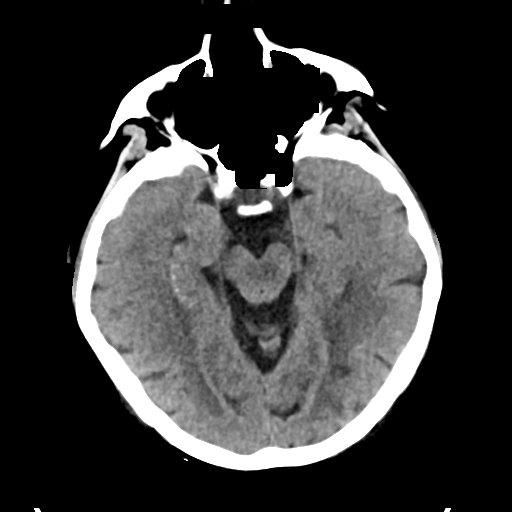
[im 16/31  brain]
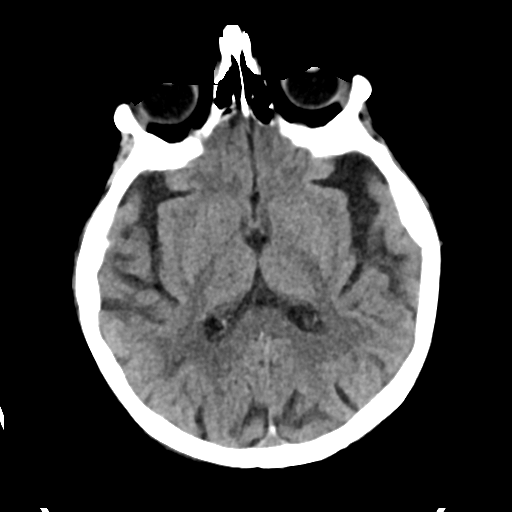
[im 19/31  brain]
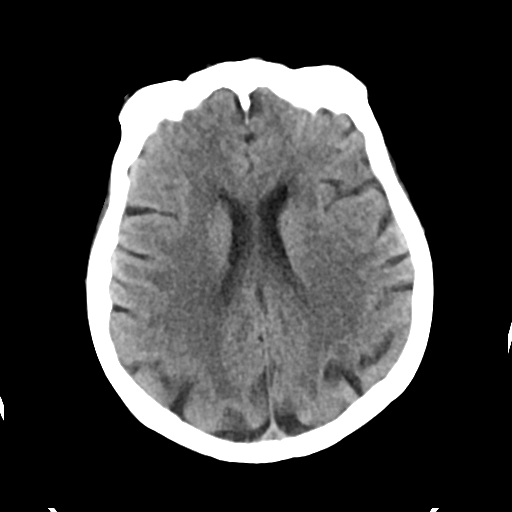
[im 19/31  bone]
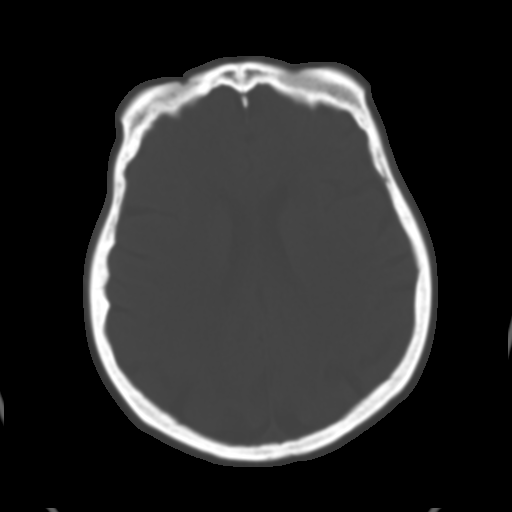
[im 23/31  brain]
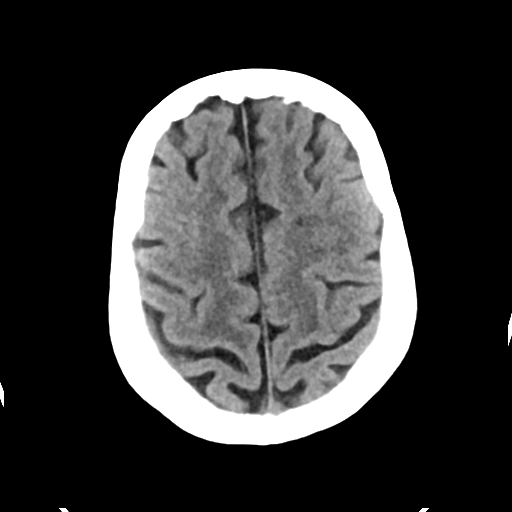
[im 27/31  brain]
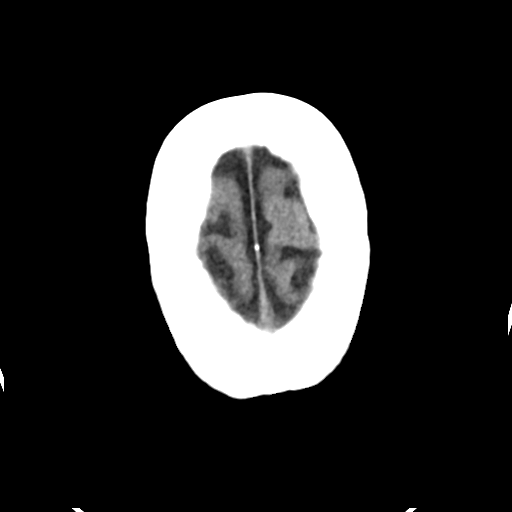

[Series 4: head bone · axial · 0.38mm/px · z∈[-208,-178]mm · 3 of 76 slices shown]
[im 8/76  bone]
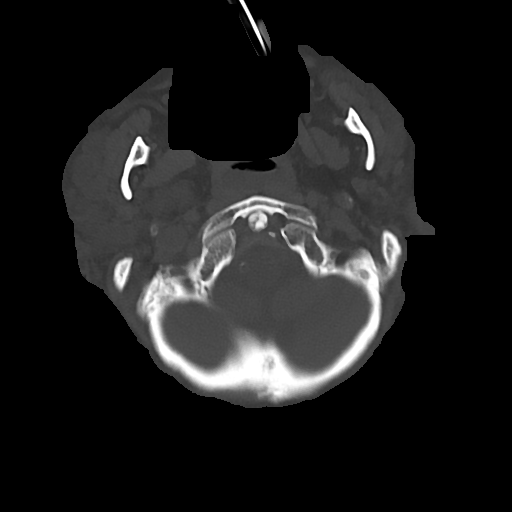
[im 16/76  bone]
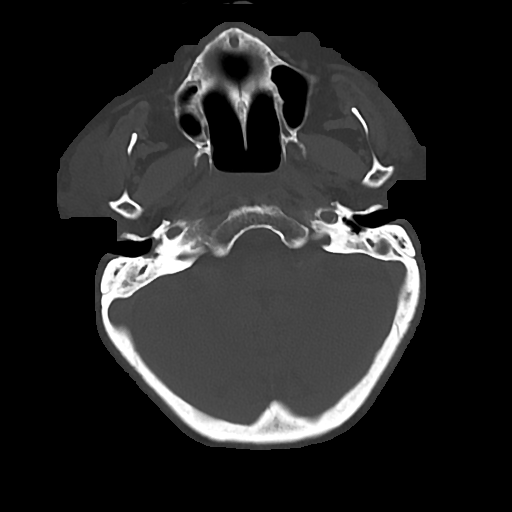
[im 23/76  bone]
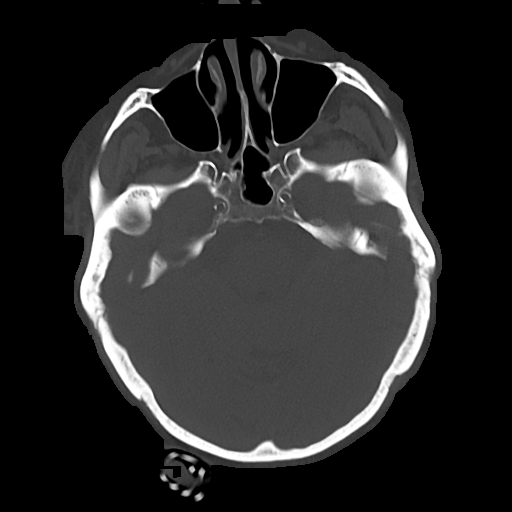

[Series 5: cor soft · coronal · 0.36mm/px · 3 of 61 slices shown]
[im 21/61  brain]
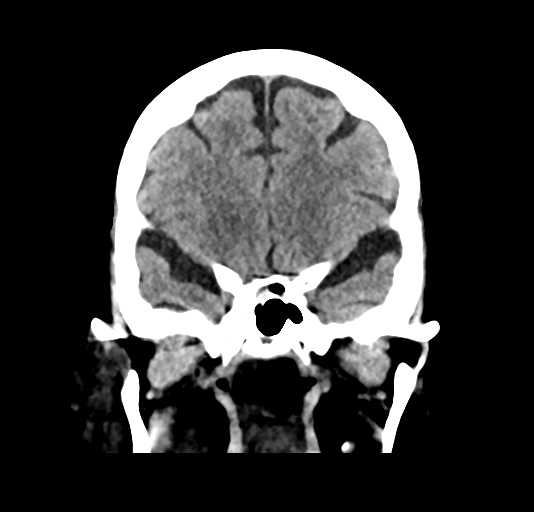
[im 27/61  brain]
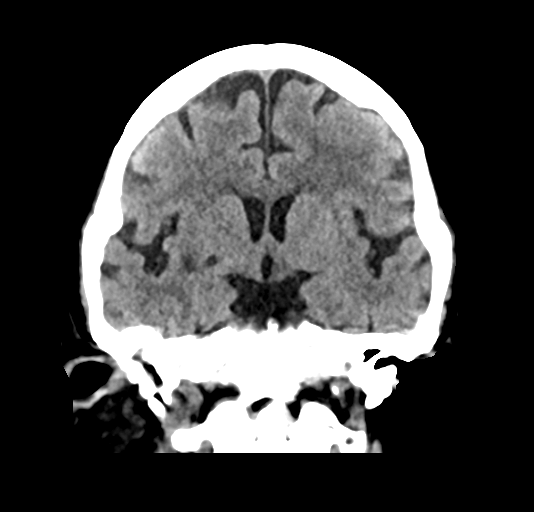
[im 34/61  brain]
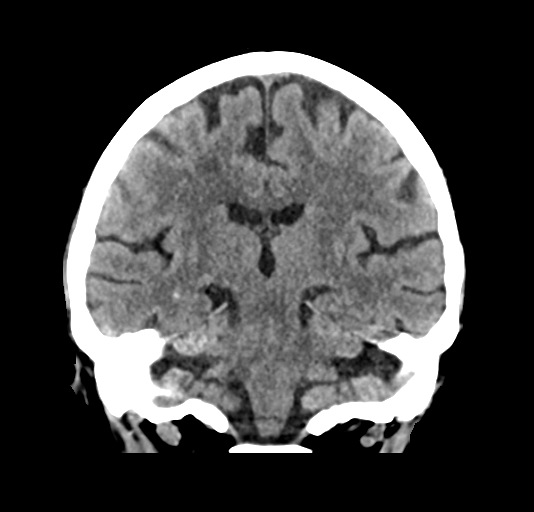

[Series 6: sag soft · sagittal · 0.34mm/px · 3 of 52 slices shown]
[im 18/52  brain]
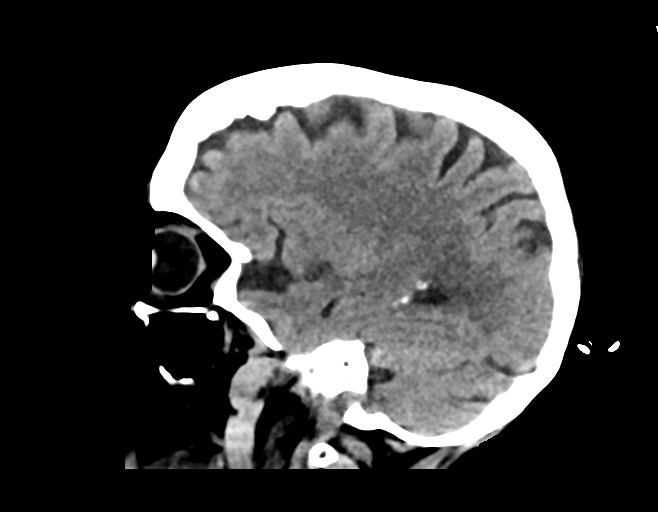
[im 26/52  brain]
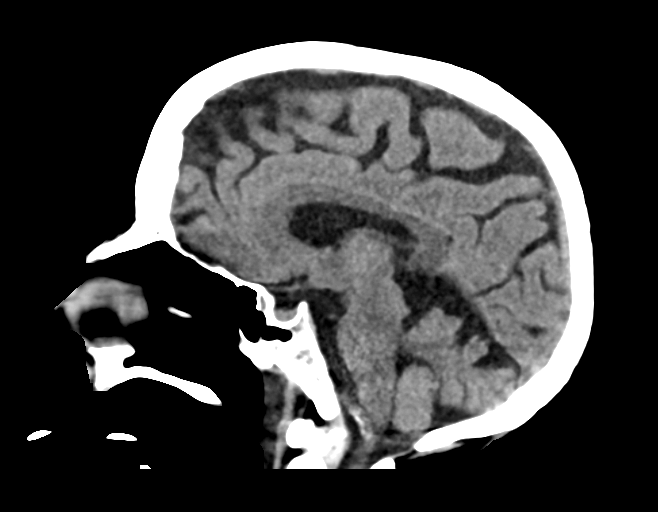
[im 35/52  brain]
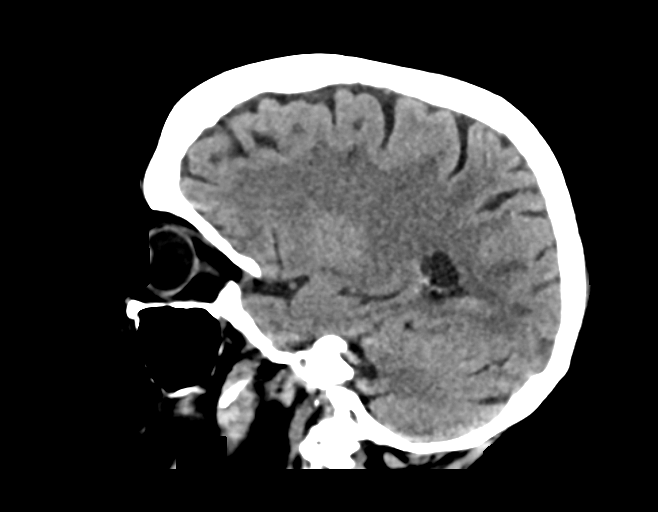

[16 of 47 positions shown; findings below may reference images not displayed]

FINDINGS: Brain: Age-related cerebral atrophy with mild chronic microvascular
ischemic disease again noted. Remote lacunar infarcts noted at the
bilateral basal ganglia.

There is question of an evolving 1 cm hypodensity involving the
right subinsular white matter (series 3, image 15), increased in
prominence from previous exam. Additionally, there is an additional
approximate 1 cm hypodense the involving the posterior right centrum
semi ovale, also increased in prominence (series 3, image 22).
Findings could reflect evolving acute to subacute ischemic infarcts.
No other definite areas of acute or evolving large vessel ischemia.
No acute intracranial hemorrhage.

Vascular: No hyperdense vessel. Scattered vascular calcifications
noted within the carotid siphons.

Skull: Scalp soft tissues and calvarium within normal limits.

Sinuses/Orbits: Globes and orbital soft tissues within normal
limits. Paranasal sinuses are largely clear. Endotracheal and
enteric tubes partially visualized. Bilateral mastoid effusions with
fluid seen layering within the nasopharynx, likely related
intubation.

Other: None.
IMPRESSION: 1. Question approximate 1 cm involving hypodensities involving the
right subinsular white matter and right centrum semi ovale,
increased in prominence from recent CT, and suspicious for evolving
acute to subacute ischemic infarcts. Finding could be further
assessed with dedicated brain MRI for further evaluation.
2. No other acute intracranial abnormality identified.
3. Underlying age-related cerebral atrophy with chronic
microvascular ischemic disease.
